# Patient Record
Sex: Female | Born: 1935
Health system: Southern US, Community
[De-identification: ages and names within clinical notes are randomized; demographics above are authoritative.]

## PROBLEM LIST (undated history)

## (undated) DIAGNOSIS — E785 Hyperlipidemia, unspecified: Secondary | ICD-10-CM

## (undated) DIAGNOSIS — R32 Unspecified urinary incontinence: Secondary | ICD-10-CM

## (undated) DIAGNOSIS — R519 Headache, unspecified: Secondary | ICD-10-CM

## (undated) DIAGNOSIS — F329 Major depressive disorder, single episode, unspecified: Secondary | ICD-10-CM

## (undated) DIAGNOSIS — R51 Headache: Secondary | ICD-10-CM

## (undated) DIAGNOSIS — G43909 Migraine, unspecified, not intractable, without status migrainosus: Secondary | ICD-10-CM

## (undated) DIAGNOSIS — R002 Palpitations: Secondary | ICD-10-CM

## (undated) DIAGNOSIS — M1990 Unspecified osteoarthritis, unspecified site: Secondary | ICD-10-CM

## (undated) DIAGNOSIS — I10 Essential (primary) hypertension: Secondary | ICD-10-CM

## (undated) DIAGNOSIS — E1140 Type 2 diabetes mellitus with diabetic neuropathy, unspecified: Secondary | ICD-10-CM

## (undated) DIAGNOSIS — F32A Depression, unspecified: Secondary | ICD-10-CM

## (undated) DIAGNOSIS — R42 Dizziness and giddiness: Secondary | ICD-10-CM

## (undated) DIAGNOSIS — Z5189 Encounter for other specified aftercare: Secondary | ICD-10-CM

## (undated) DIAGNOSIS — H409 Unspecified glaucoma: Secondary | ICD-10-CM

## (undated) HISTORY — DX: Palpitations: R00.2

## (undated) HISTORY — DX: Migraine, unspecified, not intractable, without status migrainosus: G43.909

## (undated) HISTORY — PX: OTHER SURGICAL HISTORY: SHX169

## (undated) HISTORY — DX: Unspecified osteoarthritis, unspecified site: M19.90

## (undated) HISTORY — DX: Depression, unspecified: F32.A

## (undated) HISTORY — DX: Essential (primary) hypertension: I10

## (undated) HISTORY — DX: Encounter for other specified aftercare: Z51.89

## (undated) HISTORY — DX: Unspecified urinary incontinence: R32

## (undated) HISTORY — DX: Headache, unspecified: R51.9

## (undated) HISTORY — DX: Unspecified glaucoma: H40.9

## (undated) HISTORY — DX: Hyperlipidemia, unspecified: E78.5

## (undated) HISTORY — DX: Headache: R51

## (undated) HISTORY — DX: Major depressive disorder, single episode, unspecified: F32.9

## (undated) HISTORY — DX: Dizziness and giddiness: R42

## (undated) HISTORY — DX: Type 2 diabetes mellitus with diabetic neuropathy, unspecified: E11.40

---

## 2016-12-30 DIAGNOSIS — Z6828 Body mass index (BMI) 28.0-28.9, adult: Secondary | ICD-10-CM | POA: Diagnosis not present

## 2016-12-30 DIAGNOSIS — E119 Type 2 diabetes mellitus without complications: Secondary | ICD-10-CM | POA: Diagnosis not present

## 2016-12-30 DIAGNOSIS — Z794 Long term (current) use of insulin: Secondary | ICD-10-CM | POA: Diagnosis not present

## 2016-12-30 DIAGNOSIS — E785 Hyperlipidemia, unspecified: Secondary | ICD-10-CM | POA: Insufficient documentation

## 2016-12-30 DIAGNOSIS — I10 Essential (primary) hypertension: Secondary | ICD-10-CM | POA: Insufficient documentation

## 2017-01-01 DIAGNOSIS — E119 Type 2 diabetes mellitus without complications: Secondary | ICD-10-CM | POA: Diagnosis not present

## 2017-01-27 ENCOUNTER — Other Ambulatory Visit (HOSPITAL_COMMUNITY)
Admission: RE | Admit: 2017-01-27 | Discharge: 2017-01-27 | Disposition: A | Discharge status: Home / Self Care | Payer: Medicare Other | Source: Ambulatory Visit | Attending: Obstetrics and Gynecology | Admitting: Obstetrics and Gynecology

## 2017-01-27 ENCOUNTER — Other Ambulatory Visit: Payer: Self-pay | Admitting: Obstetrics and Gynecology

## 2017-01-27 DIAGNOSIS — Z01419 Encounter for gynecological examination (general) (routine) without abnormal findings: Secondary | ICD-10-CM | POA: Insufficient documentation

## 2017-01-27 DIAGNOSIS — Z1151 Encounter for screening for human papillomavirus (HPV): Secondary | ICD-10-CM | POA: Insufficient documentation

## 2017-01-27 DIAGNOSIS — Z01411 Encounter for gynecological examination (general) (routine) with abnormal findings: Secondary | ICD-10-CM | POA: Diagnosis not present

## 2017-01-27 DIAGNOSIS — Z7984 Long term (current) use of oral hypoglycemic drugs: Secondary | ICD-10-CM | POA: Diagnosis not present

## 2017-01-27 DIAGNOSIS — I10 Essential (primary) hypertension: Secondary | ICD-10-CM | POA: Diagnosis not present

## 2017-01-29 LAB — CYTOLOGY - PAP
ADEQUACY: ABSENT
Diagnosis: NEGATIVE
HPV: NOT DETECTED

## 2017-02-05 ENCOUNTER — Telehealth: Payer: Self-pay | Admitting: *Deleted

## 2017-02-05 NOTE — Telephone Encounter (Signed)
NOTES SENT TO SCHEDULING.  °

## 2017-02-11 DIAGNOSIS — R5382 Chronic fatigue, unspecified: Secondary | ICD-10-CM | POA: Diagnosis not present

## 2017-02-11 DIAGNOSIS — E119 Type 2 diabetes mellitus without complications: Secondary | ICD-10-CM | POA: Diagnosis not present

## 2017-02-11 DIAGNOSIS — Z794 Long term (current) use of insulin: Secondary | ICD-10-CM | POA: Diagnosis not present

## 2017-02-11 DIAGNOSIS — E785 Hyperlipidemia, unspecified: Secondary | ICD-10-CM | POA: Diagnosis not present

## 2017-02-11 DIAGNOSIS — Z6829 Body mass index (BMI) 29.0-29.9, adult: Secondary | ICD-10-CM | POA: Diagnosis not present

## 2017-02-11 DIAGNOSIS — I10 Essential (primary) hypertension: Secondary | ICD-10-CM | POA: Diagnosis not present

## 2017-03-09 DIAGNOSIS — N814 Uterovaginal prolapse, unspecified: Secondary | ICD-10-CM | POA: Diagnosis not present

## 2017-03-12 ENCOUNTER — Encounter: Payer: Self-pay | Admitting: Interventional Cardiology

## 2017-03-16 DIAGNOSIS — M25551 Pain in right hip: Secondary | ICD-10-CM | POA: Diagnosis not present

## 2017-03-16 DIAGNOSIS — Z6829 Body mass index (BMI) 29.0-29.9, adult: Secondary | ICD-10-CM | POA: Diagnosis not present

## 2017-03-29 NOTE — Progress Notes (Signed)
Cardiology Office Note    Date:  03/30/2017   ID:  Karen Powell, DOB [birth year], MRN [id number]  PCP:  Deneen Harts, FNP  Cardiologist: Lesleigh Noe, MD   Chief Complaint  Patient presents with  . Advice Only    History of Present Illness:  Karen Powell is a 81 y.o. female referred by Deneen Harts NP for evaluation/consultation/And longitudinal care for hypertension and recurrent palpitations. Previously seen by [facility].  This patient brings records from her prior cardiologist in [facility]. There is no pre-existing cardiac diagnosis other than hypertension she has no particular cardiac complaints but has significant cardiovascular risk factors including poorly controlled type 2 diabetes mellitus with last hemoglobin A1c of 10.4, hypertension, not well controlled, and significant hyperlipidemia. She has never been a smoker. She has been troubled at times by palpitations and hearing her heart beat in her ear particularly at night when she is attempting to sleep. She has never had syncope. She ambulates without limitations. She denies chest pain and exertion-related dyspnea. There is no orthopnea.  Her mother informed her that she was born with a hole in her heart. No specific diagnosis is known by the patient  Past Medical History:  Diagnosis Date  . Arthritis   . Depression   . Diabetic neuropathy (HCC)   . Dizziness   . Hyperlipidemia   . Hypertension   . Palpitations     Past Surgical History:  Procedure Laterality Date  . none      Current Medications: Outpatient Medications Prior to Visit  Medication Sig Dispense Refill  . Cetirizine HCl (ZYRTEC ALLERGY PO) Take 1 tablet by mouth daily.    . Coenzyme Q10 (COQ10) 200 MG CAPS Take 1 capsule by mouth daily.    . Glucosamine HCl (GLUCOSAMINE PO) Take 3 tablets by mouth daily.    . insulin aspart (NOVOLOG) 100 UNIT/ML injection Inject 15 Units into the skin 3 (three) times daily with meals.      . insulin glargine (LANTUS) 100 UNIT/ML injection Inject 50 Units into the skin at bedtime.     Marland Kitchen losartan-hydrochlorothiazide (HYZAAR) 50-12.5 MG tablet Take 1 tablet by mouth daily.    . meloxicam (MOBIC) 15 MG tablet Take 15 mg by mouth daily.    . Naproxen Sodium (ALEVE PO) Take 1 tablet by mouth daily as needed (pain).     Maxwell Caul Bicarbonate (ZEGERID PO) Take 1 tablet by mouth daily.    Bertram Gala Glycol-Propyl Glycol (SYSTANE ULTRA OP) Apply 1 drop to eye daily.    . rosuvastatin (CRESTOR) 20 MG tablet Take 20 mg by mouth at bedtime.    . metFORMIN (GLUCOPHAGE) 500 MG tablet Take 500 mg by mouth 2 (two) times daily with a meal. Take 2 tablets by mouth in AM and 1 tablet by mouth in PM.    . metoprolol succinate (TOPROL-XL) 25 MG 24 hr tablet Take 25 mg by mouth 2 (two) times daily. Take 2 tablets by mouth in AM and 1 tablet by mouth in PM.     No facility-administered medications prior to visit.      Allergies:   Penicillins   Social History   Social History  . Marital status: Single    Spouse name: N/A  . Number of children: 4  . Years of education: 12   Occupational History  . retired    Social History Main Topics  . Smoking status: Never Smoker  . Smokeless tobacco: Never Used  .  Alcohol use Yes  . Drug use: No  . Sexual activity: Not Asked   Other Topics Concern  . None   Social History Narrative  . None     Family History:  The patient's family history includes Diabetes in her maternal grandfather and mother; Hypertension in her mother.   ROS:   Please see the history of present illness.    Back pain, muscle pain, depression, constipation, difficulty urinating, balance issues, headaches, leg pain, excessive fatigue, and cough.  All other systems reviewed and are negative.   PHYSICAL EXAM:   VS:  BP (!) 154/80 (BP Location: Left Arm)   Pulse 65   Ht 5' 3.5" (1.613 m)   Wt 158 lb (71.7 kg)   BMI 27.55 kg/m    GEN: Well nourished, well  developed, in no acute distress . African-American female in no acute distress. HEENT: normal . Poor dentition Neck: no JVD, carotid bruits, or masses Cardiac: RRR; no murmurs, rubs, or gallops,no edema  Respiratory:  clear to auscultation bilaterally, normal work of breathing GI: soft, nontender, nondistended, + BS MS: no deformity or atrophy  Skin: warm and dry, no rash Neuro:  Alert and Oriented x 3, Strength and sensation are intact Psych: euthymic mood, full affect  Wt Readings from Last 3 Encounters:  03/30/17 158 lb (71.7 kg)      Studies/Labs Reviewed:   EKG:  EKG  Normal sinus rhythm with nonspecific T-wave abnormality and rare PAC. No acute change and no evidence of prior infarction.  Recent Labs: No results found for requested labs within last 8760 hours.   Lipid Panel No results found for: CHOL, TRIG, HDL, CHOLHDL, VLDL, LDLCALC, LDLDIRECT  Additional studies/ records that were reviewed today include:  Most recent myocardial perfusion study may 2012 without evidence of ischemia. EF 63%  Most recent echocardiogram August 2012 with EF 72%, mild concentric hypertrophy, and aortic valve sclerosis. Holter monitor 2005 reveals normal sinus rhythm with multiple premature atrial complexes and rare ventricular immature contractions.    ASSESSMENT:    1. Essential hypertension   2. Palpitations   3. Mixed hyperlipidemia   4. Type 2 diabetes mellitus with complication, with long-term current use of insulin (HCC)      PLAN:  In order of problems listed above:  1. Poor blood pressure control. 2 g sodium diet as indicated. Consider increasing Hyzaar to 100/12.5 mg daily. Continue metoprolol succinate 25 mg per day. 2. Will not do further workup. These complaints are mostly related to hearing her heart beat in the right ear. 3. LDL cholesterol target less than equal to 70. Continue/intensify rosuvastatin dose. 4. As best I can tell from reviewing prior records, diabetes  control is poor with hemoglobin A1c greater than 10 [place] recently [place]   I have encouraged aerobic activity. Clinical follow-up in one year. No change in medical therapy. Risk factor modification to prevent progression to overt vascular disease: LDL less than 70, blood pressure less than 130/90 mmHg, aerobic activity, A1c 7 or less, and aspirin 81 mg daily rather than when necessary as she has done in the past.  With her current level of stability, we will plan to see her back in approximately one year.    Medication Adjustments/Labs and Tests Ordered: Current medicines are reviewed at length with the patient today.  Concerns regarding medicines are outlined above.  Medication changes, Labs and Tests ordered today are listed in the Patient Instructions below. Patient Instructions  Medication  Instructions:  1) START Aspirin 81mg  once daily  Labwork: None  Testing/Procedures: None  Follow-Up: Your physician wants you to follow-up in: 1 year with Dr. Katrinka Blazing.  You will receive a reminder letter in the mail two months in advance. If you don't receive a letter, please call our office to schedule the follow-up appointment.   Any Other Special Instructions Will Be Listed Below (If Applicable).     If you need a refill on your cardiac medications before your next appointment, please call your pharmacy.      Signed, Lesleigh Noe, MD  03/30/2017 2:53 PM    [facility] [address] [place], [place], [place]  [address] Phone: [phone]; Fax: [phone]

## 2017-03-30 ENCOUNTER — Encounter (INDEPENDENT_AMBULATORY_CARE_PROVIDER_SITE_OTHER): Payer: Self-pay

## 2017-03-30 ENCOUNTER — Encounter: Payer: Self-pay | Admitting: Interventional Cardiology

## 2017-03-30 ENCOUNTER — Ambulatory Visit (INDEPENDENT_AMBULATORY_CARE_PROVIDER_SITE_OTHER): Payer: Medicare Other | Admitting: Interventional Cardiology

## 2017-03-30 VITALS — BP 154/80 | HR 65 | Ht 63.5 in | Wt 158.0 lb

## 2017-03-30 DIAGNOSIS — R002 Palpitations: Secondary | ICD-10-CM

## 2017-03-30 DIAGNOSIS — E782 Mixed hyperlipidemia: Secondary | ICD-10-CM | POA: Diagnosis not present

## 2017-03-30 DIAGNOSIS — E118 Type 2 diabetes mellitus with unspecified complications: Secondary | ICD-10-CM

## 2017-03-30 DIAGNOSIS — Z794 Long term (current) use of insulin: Secondary | ICD-10-CM

## 2017-03-30 DIAGNOSIS — I10 Essential (primary) hypertension: Secondary | ICD-10-CM | POA: Diagnosis not present

## 2017-03-30 MED ORDER — ASPIRIN EC 81 MG PO TBEC
81.0000 mg | DELAYED_RELEASE_TABLET | Freq: Every day | ORAL | 3 refills | Status: DC
Start: 2017-03-30 — End: 2024-06-06

## 2017-03-30 NOTE — Patient Instructions (Signed)
Medication Instructions:  1) START Aspirin 81mg once daily  Labwork: None  Testing/Procedures: None  Follow-Up: Your physician wants you to follow-up in: 1 year with Dr. Smith.  You will receive a reminder letter in the mail two months in advance. If you don't receive a letter, please call our office to schedule the follow-up appointment.   Any Other Special Instructions Will Be Listed Below (If Applicable).     If you need a refill on your cardiac medications before your next appointment, please call your pharmacy.   

## 2017-04-27 DIAGNOSIS — Z780 Asymptomatic menopausal state: Secondary | ICD-10-CM | POA: Diagnosis not present

## 2017-07-16 DIAGNOSIS — E785 Hyperlipidemia, unspecified: Secondary | ICD-10-CM | POA: Diagnosis not present

## 2017-07-16 DIAGNOSIS — Z794 Long term (current) use of insulin: Secondary | ICD-10-CM | POA: Diagnosis not present

## 2017-07-16 DIAGNOSIS — Z23 Encounter for immunization: Secondary | ICD-10-CM | POA: Diagnosis not present

## 2017-07-16 DIAGNOSIS — I10 Essential (primary) hypertension: Secondary | ICD-10-CM | POA: Diagnosis not present

## 2017-07-16 DIAGNOSIS — E119 Type 2 diabetes mellitus without complications: Secondary | ICD-10-CM | POA: Diagnosis not present

## 2017-08-17 DIAGNOSIS — E785 Hyperlipidemia, unspecified: Secondary | ICD-10-CM | POA: Diagnosis not present

## 2017-08-17 DIAGNOSIS — I10 Essential (primary) hypertension: Secondary | ICD-10-CM | POA: Diagnosis not present

## 2017-08-17 DIAGNOSIS — Z794 Long term (current) use of insulin: Secondary | ICD-10-CM | POA: Diagnosis not present

## 2017-08-17 DIAGNOSIS — E1165 Type 2 diabetes mellitus with hyperglycemia: Secondary | ICD-10-CM | POA: Diagnosis not present

## 2017-09-29 DIAGNOSIS — Z794 Long term (current) use of insulin: Secondary | ICD-10-CM | POA: Diagnosis not present

## 2017-09-29 DIAGNOSIS — E1165 Type 2 diabetes mellitus with hyperglycemia: Secondary | ICD-10-CM | POA: Diagnosis not present

## 2017-10-20 DIAGNOSIS — Z7289 Other problems related to lifestyle: Secondary | ICD-10-CM | POA: Diagnosis not present

## 2017-10-20 DIAGNOSIS — E1165 Type 2 diabetes mellitus with hyperglycemia: Secondary | ICD-10-CM | POA: Diagnosis not present

## 2017-10-20 DIAGNOSIS — R05 Cough: Secondary | ICD-10-CM | POA: Diagnosis not present

## 2017-10-20 DIAGNOSIS — J069 Acute upper respiratory infection, unspecified: Secondary | ICD-10-CM | POA: Diagnosis not present

## 2017-10-20 DIAGNOSIS — I10 Essential (primary) hypertension: Secondary | ICD-10-CM | POA: Diagnosis not present

## 2017-10-20 DIAGNOSIS — Z794 Long term (current) use of insulin: Secondary | ICD-10-CM | POA: Diagnosis not present

## 2018-01-13 ENCOUNTER — Telehealth: Payer: Self-pay | Admitting: Interventional Cardiology

## 2018-01-13 NOTE — Telephone Encounter (Signed)
New message   1. What dental office are you calling from? Dr Domingo Dimesdeh  2. What is your office phone and fax number? PHONE [phone], FAX [phone]  3. What type of procedure is the patient having performed? Extraction and dental filling  4. What date is procedure scheduled or is the patient there now? 01/13/2018 (patient in office now)  5. What is your question (ex. Antibiotics prior to procedure, holding medication-we need to know how long dentist wants pt to hold med)? Is pre medication needed

## 2018-01-13 NOTE — Telephone Encounter (Signed)
Per Corine ShelterLuke Kilroy, [place], pt has not been seen in over a year but does not have any active cardiac issues. She does not need to be pre-medicated prior to dental work.

## 2018-02-23 DIAGNOSIS — Z23 Encounter for immunization: Secondary | ICD-10-CM | POA: Diagnosis not present

## 2018-02-23 DIAGNOSIS — L299 Pruritus, unspecified: Secondary | ICD-10-CM | POA: Diagnosis not present

## 2018-02-23 DIAGNOSIS — E1169 Type 2 diabetes mellitus with other specified complication: Secondary | ICD-10-CM | POA: Diagnosis not present

## 2018-02-23 DIAGNOSIS — I10 Essential (primary) hypertension: Secondary | ICD-10-CM | POA: Diagnosis not present

## 2018-02-23 DIAGNOSIS — E785 Hyperlipidemia, unspecified: Secondary | ICD-10-CM | POA: Diagnosis not present

## 2018-04-14 DIAGNOSIS — Z1211 Encounter for screening for malignant neoplasm of colon: Secondary | ICD-10-CM | POA: Diagnosis not present

## 2018-04-14 DIAGNOSIS — K921 Melena: Secondary | ICD-10-CM | POA: Diagnosis not present

## 2018-04-19 DIAGNOSIS — K921 Melena: Secondary | ICD-10-CM | POA: Diagnosis not present

## 2018-04-19 DIAGNOSIS — Z1211 Encounter for screening for malignant neoplasm of colon: Secondary | ICD-10-CM | POA: Diagnosis not present

## 2018-04-20 DIAGNOSIS — Z794 Long term (current) use of insulin: Secondary | ICD-10-CM | POA: Diagnosis not present

## 2018-04-20 DIAGNOSIS — Z7289 Other problems related to lifestyle: Secondary | ICD-10-CM | POA: Diagnosis not present

## 2018-04-20 DIAGNOSIS — I10 Essential (primary) hypertension: Secondary | ICD-10-CM | POA: Diagnosis not present

## 2018-04-20 DIAGNOSIS — E1165 Type 2 diabetes mellitus with hyperglycemia: Secondary | ICD-10-CM | POA: Diagnosis not present

## 2018-04-20 DIAGNOSIS — K08409 Partial loss of teeth, unspecified cause, unspecified class: Secondary | ICD-10-CM | POA: Diagnosis not present

## 2018-05-11 DIAGNOSIS — Z23 Encounter for immunization: Secondary | ICD-10-CM | POA: Diagnosis not present

## 2018-05-13 DIAGNOSIS — K648 Other hemorrhoids: Secondary | ICD-10-CM | POA: Diagnosis not present

## 2018-05-13 DIAGNOSIS — Z1211 Encounter for screening for malignant neoplasm of colon: Secondary | ICD-10-CM | POA: Diagnosis not present

## 2018-05-13 DIAGNOSIS — K5730 Diverticulosis of large intestine without perforation or abscess without bleeding: Secondary | ICD-10-CM | POA: Diagnosis not present

## 2018-05-13 DIAGNOSIS — R195 Other fecal abnormalities: Secondary | ICD-10-CM | POA: Diagnosis not present

## 2018-06-17 DIAGNOSIS — M792 Neuralgia and neuritis, unspecified: Secondary | ICD-10-CM | POA: Diagnosis not present

## 2018-06-17 DIAGNOSIS — M5441 Lumbago with sciatica, right side: Secondary | ICD-10-CM | POA: Diagnosis not present

## 2018-06-17 DIAGNOSIS — M5442 Lumbago with sciatica, left side: Secondary | ICD-10-CM | POA: Diagnosis not present

## 2018-06-24 DIAGNOSIS — M545 Low back pain: Secondary | ICD-10-CM | POA: Diagnosis not present

## 2018-06-24 DIAGNOSIS — M792 Neuralgia and neuritis, unspecified: Secondary | ICD-10-CM | POA: Diagnosis not present

## 2018-06-24 DIAGNOSIS — M5441 Lumbago with sciatica, right side: Secondary | ICD-10-CM | POA: Diagnosis not present

## 2018-06-24 DIAGNOSIS — M5442 Lumbago with sciatica, left side: Secondary | ICD-10-CM | POA: Diagnosis not present

## 2018-06-24 DIAGNOSIS — M5136 Other intervertebral disc degeneration, lumbar region: Secondary | ICD-10-CM | POA: Diagnosis not present

## 2018-07-20 NOTE — Progress Notes (Signed)
Cardiology Office Note:    Date:  07/21/2018   ID:  [name], DOB October 16, 1935, MRN [id number]  PCP:  Deneen Harts, FNP  Cardiologist:  Lesleigh Noe, MD   Referring MD: Deneen Harts, FNP   Chief Complaint  Patient presents with  . Hypertension  . Irregular Heart Beat    History of Present Illness:    Karen Powell is a 82 y.o. female with a hx of hypertension and recurrent palpitations..  Also type 2 diabetes and chronic back pain.  Continues to have palpitations but no prolonged heart racing.  Denies orthopnea, PND, and syncope.  No exertional dyspnea.  No episodes of chest pain.  Lower extremity swelling towards the end of each day but without persistence.  Past Medical History:  Diagnosis Date  . Arthritis   . Depression   . Diabetic neuropathy (HCC)   . Dizziness   . Hyperlipidemia   . Hypertension   . Palpitations     Past Surgical History:  Procedure Laterality Date  . none      Current Medications: Current Meds  Medication Sig  . aspirin EC 81 MG tablet Take 1 tablet (81 mg total) by mouth daily.  . AZO-CRANBERRY PO Take 2 capsules by mouth every morning.  . Cetirizine HCl (ZYRTEC ALLERGY PO) Take 1 tablet by mouth daily.  . Coenzyme Q10 (COQ10) 200 MG CAPS Take 1 capsule by mouth daily.  Marland Kitchen esomeprazole (NEXIUM) 20 MG capsule Take 20 mg by mouth daily at 12 noon.  . insulin aspart (NOVOLOG) 100 UNIT/ML injection Inject 15 Units into the skin 3 (three) times daily with meals.  . insulin glargine (LANTUS) 100 UNIT/ML injection Inject 55 Units into the skin at bedtime.   Marland Kitchen losartan-hydrochlorothiazide (HYZAAR) 100-25 MG tablet Take 1 tablet by mouth daily.  . metFORMIN (GLUCOPHAGE) 500 MG tablet Take 500 mg by mouth 2 (two) times daily.   . Multiple Vitamins-Minerals (CENTRUM PO) Take 1 tablet by mouth daily.  . Naproxen Sodium (ALEVE PO) Take 2 tablets by mouth daily as needed (pain).   . Omega-3 Fatty Acids (OMEGA 3 PO) Take 1 capsule by  mouth daily.  Bertram Gala Glycol-Propyl Glycol (SYSTANE ULTRA OP) Apply 1 drop to eye daily.  . Probiotic Product (PROBIOTIC PO) Take 1 capsule by mouth at bedtime.  . rosuvastatin (CRESTOR) 20 MG tablet Take 20 mg by mouth at bedtime.  Bernadette Hoit Sodium (SENOKOT S PO) Take 1 tablet by mouth 2 (two) times daily.  . TOPROL XL 25 MG 24 hr tablet Take 2 tablets (50 mg) by mouth each morning and 1 tablet (25 mg) by mouth each evening.     Allergies:   Penicillins   Social History   Socioeconomic History  . Marital status: Single    Spouse name: Not on file  . Number of children: 4  . Years of education: 32  . Highest education level: Not on file  Occupational History  . Occupation: retired  Engineer, production  . Financial resource strain: Not on file  . Food insecurity:    Worry: Not on file    Inability: Not on file  . Transportation needs:    Medical: Not on file    Non-medical: Not on file  Tobacco Use  . Smoking status: Never Smoker  . Smokeless tobacco: Never Used  Substance and Sexual Activity  . Alcohol use: Yes  . Drug use: No  . Sexual activity: Not on file  Lifestyle  . Physical  activity:    Days per week: Not on file    Minutes per session: Not on file  . Stress: Not on file  Relationships  . Social connections:    Talks on phone: Not on file    Gets together: Not on file    Attends religious service: Not on file    Active member of club or organization: Not on file    Attends meetings of clubs or organizations: Not on file    Relationship status: Not on file  Other Topics Concern  . Not on file  Social History Narrative  . Not on file     Family History: The patient's family history includes Diabetes in her maternal grandfather and mother; Hypertension in her mother.  ROS:   Please see the history of present illness.    Significant back pain decreased hearing, cough, abdominal pain, depression, and leg swelling.  All other systems reviewed and  are negative.  EKGs/Labs/Other Studies Reviewed:    The following studies were reviewed today:  Hemoglobin A1c 7.6  LDL 67  EKG:  EKG is  ordered today.  The ekg ordered today demonstrates normal sinus rhythm, prominent voltage, left axis deviation, nonspecific ST abnormality.  When compared to prior performed on 03/30/2017, no significant change has occurred.  Recent Labs: No results found for requested labs within last 8760 hours.  Recent Lipid Panel No results found for: CHOL, TRIG, HDL, CHOLHDL, VLDL, LDLCALC, LDLDIRECT  Physical Exam:    VS:  BP (!) 180/96   Pulse 70   Ht 5\' 3"  (1.6 m)   Wt 172 lb 1.9 oz (78.1 kg)   BMI 30.49 kg/m     Wt Readings from Last 3 Encounters:  07/21/18 172 lb 1.9 oz (78.1 kg)  03/30/17 158 lb (71.7 kg)     GEN:  Well nourished, well developed in no acute distress HEENT: Normal NECK: No JVD. LYMPHATICS: No lymphadenopathy CARDIAC: RRR, no murmur, no gallop, no edema. VASCULAR: 2+ bilateral radial and carotid pulses.  No bruits. RESPIRATORY:  Clear to auscultation without rales, wheezing or rhonchi  ABDOMEN: Soft, non-tender, non-distended, No pulsatile mass, MUSCULOSKELETAL: No deformity  SKIN: Warm and dry NEUROLOGIC:  Alert and oriented x 3 PSYCHIATRIC:  Normal affect   ASSESSMENT:    1. Palpitations   2. Essential hypertension   3. Type 2 diabetes mellitus with complication, with long-term current use of insulin (HCC)   4. Mixed hyperlipidemia    PLAN:    In order of problems listed above:  1. No change and no concerns 2. Severe elevation today but has not yet had medications.  We discussed the target of 130/80 mmHg. 3. A1c less than 7 is target 4. LDL target less than 70.  Encourage aerobic activity.  No testing required at this time.  Clinical follow-up in 1 year.  Notify us if symptoms.   Medication Adjustments/Labs and Tests Ordered: Current medicines are reviewed at length with the patient today.  Concerns regarding  medicines are outlined above.  Orders Placed This Encounter  Procedures  . EKG 12-Lead   No orders of the defined types were placed in this encounter.   Patient Instructions  Medication Instructions:  Your physician recommends that you continue on your current medications as directed. Please refer to the Current Medication list given to you today.  If you need a refill on your cardiac medications before your next appointment, please call your pharmacy.   Lab work: none If you have  labs (blood work) drawn today and your tests are completely normal, you will receive your results only by: Marland Kitchen MyChart Message (if you have MyChart) OR . A paper copy in the mail If you have any lab test that is abnormal or we need to change your treatment, we will call you to review the results.  Testing/Procedures: none  Follow-Up: At [facility], you and your health needs are our priority.  As part of our continuing mission to provide you with exceptional heart care, we have created designated Provider Care Teams.  These Care Teams include your primary Cardiologist (physician) and Advanced Practice Providers (APPs -  Physician Assistants and Nurse Practitioners) who all work together to provide you with the care you need, when you need it. You will need a follow up appointment in 12 months.  Please call our office 2 months in advance to schedule this appointment.  You may see Lesleigh Noe, MD or one of the following Advanced Practice Providers on your designated Care Team:   Norma Fredrickson, NP Nada Boozer, NP . Georgie Chard, NP  Any Other Special Instructions Will Be Listed Below (If Applicable).       Signed, Lesleigh Noe, MD  07/21/2018 9:56 AM    [facility]

## 2018-07-21 ENCOUNTER — Ambulatory Visit (INDEPENDENT_AMBULATORY_CARE_PROVIDER_SITE_OTHER): Payer: Medicare Other | Admitting: Interventional Cardiology

## 2018-07-21 ENCOUNTER — Encounter

## 2018-07-21 ENCOUNTER — Encounter: Payer: Self-pay | Admitting: Interventional Cardiology

## 2018-07-21 VITALS — BP 180/96 | HR 70 | Ht 63.0 in | Wt 172.1 lb

## 2018-07-21 DIAGNOSIS — Z794 Long term (current) use of insulin: Secondary | ICD-10-CM | POA: Diagnosis not present

## 2018-07-21 DIAGNOSIS — R002 Palpitations: Secondary | ICD-10-CM

## 2018-07-21 DIAGNOSIS — I10 Essential (primary) hypertension: Secondary | ICD-10-CM

## 2018-07-21 DIAGNOSIS — E118 Type 2 diabetes mellitus with unspecified complications: Secondary | ICD-10-CM | POA: Diagnosis not present

## 2018-07-21 DIAGNOSIS — E782 Mixed hyperlipidemia: Secondary | ICD-10-CM | POA: Diagnosis not present

## 2018-07-21 NOTE — Patient Instructions (Signed)

## 2018-08-13 ENCOUNTER — Other Ambulatory Visit: Payer: Self-pay | Admitting: *Deleted

## 2018-08-13 DIAGNOSIS — N819 Female genital prolapse, unspecified: Secondary | ICD-10-CM | POA: Diagnosis not present

## 2018-08-13 DIAGNOSIS — Z4689 Encounter for fitting and adjustment of other specified devices: Secondary | ICD-10-CM | POA: Diagnosis not present

## 2018-08-13 MED ORDER — TOPROL XL 25 MG PO TB24: ORAL_TABLET | ORAL | 3 refills | Status: DC

## 2018-08-13 NOTE — Telephone Encounter (Signed)
Patient requesting that Dr Katrinka BlazingSmith refill this for her as her pcp will no longer refill it. Okay to refill? Please advise. Thanks, MI

## 2018-08-13 NOTE — Telephone Encounter (Signed)
That will be fine. Thanks 

## 2018-09-28 DIAGNOSIS — Z4689 Encounter for fitting and adjustment of other specified devices: Secondary | ICD-10-CM | POA: Diagnosis not present

## 2018-11-15 ENCOUNTER — Telehealth: Payer: Self-pay

## 2018-11-15 NOTE — Telephone Encounter (Signed)
**Note De-Identified  Obfuscation** Letter received from [place] stating that they have approved a tier exception on the pts Toprol XL. Approval good from 08/14/2018 until 11/12/2019.  I have notified the pts pharmacy of this approval.

## 2018-11-25 ENCOUNTER — Ambulatory Visit (INDEPENDENT_AMBULATORY_CARE_PROVIDER_SITE_OTHER): Payer: Medicare Other | Admitting: Family Medicine

## 2018-11-25 ENCOUNTER — Encounter: Payer: Self-pay | Admitting: Family Medicine

## 2018-11-25 VITALS — BP 152/82 | HR 80 | Temp 98.7°F | Ht 63.0 in | Wt 170.0 lb

## 2018-11-25 DIAGNOSIS — Z794 Long term (current) use of insulin: Secondary | ICD-10-CM

## 2018-11-25 DIAGNOSIS — Z7689 Persons encountering health services in other specified circumstances: Secondary | ICD-10-CM | POA: Diagnosis not present

## 2018-11-25 DIAGNOSIS — M65352 Trigger finger, left little finger: Secondary | ICD-10-CM | POA: Diagnosis not present

## 2018-11-25 DIAGNOSIS — I10 Essential (primary) hypertension: Secondary | ICD-10-CM | POA: Diagnosis not present

## 2018-11-25 DIAGNOSIS — E1140 Type 2 diabetes mellitus with diabetic neuropathy, unspecified: Secondary | ICD-10-CM

## 2018-11-25 DIAGNOSIS — K219 Gastro-esophageal reflux disease without esophagitis: Secondary | ICD-10-CM | POA: Diagnosis not present

## 2018-11-25 DIAGNOSIS — E782 Mixed hyperlipidemia: Secondary | ICD-10-CM | POA: Diagnosis not present

## 2018-11-25 LAB — POCT GLYCOSYLATED HEMOGLOBIN (HGB A1C): Hemoglobin A1C: 7.7 % — AB (ref 4.0–5.6)

## 2018-11-25 NOTE — Patient Instructions (Signed)
Trigger Finger  Trigger finger (stenosing tenosynovitis) is a condition that causes a finger to get stuck in a bent position. Each finger has a tough, cord-like tissue that connects muscle to bone (tendon), and each tendon is surrounded by a tunnel of tissue (tendon sheath). To move your finger, your tendon needs to slide freely through the sheath. Trigger finger happens when the tendon or the sheath thickens, making it difficult to move your finger. Trigger finger can affect any finger or a thumb. It may affect more than one finger. Mild cases may clear up with rest and medicine. Severe cases require more treatment. What are the causes? Trigger finger is caused by a thickened finger tendon or tendon sheath. The cause of this thickening is not known. What increases the risk? The following factors may make you more likely to develop this condition:  Doing activities that require a strong grip.  Having rheumatoid arthritis, gout, or diabetes.  Being 59-71 years old.  Being a woman. What are the signs or symptoms? Symptoms of this condition include:  Pain when bending or straightening your finger.  Tenderness or swelling where your finger attaches to the palm of your hand.  A lump in the palm of your hand or on the inside of your finger.  Hearing a popping sound when you try to straighten your finger.  Feeling a popping, catching, or locking sensation when you try to straighten your finger.  Being unable to straighten your finger. How is this diagnosed? This condition is diagnosed based on your symptoms and a physical exam. How is this treated? This condition may be treated by:  Resting your finger and avoiding activities that make symptoms worse.  Wearing a finger splint to keep your finger in a slightly bent position.  Taking NSAIDs to relieve pain and swelling.  Injecting medicine (steroids) into the tendon sheath to reduce swelling and irritation. Injections may need to be  repeated.  Having surgery to open the tendon sheath. This may be done if other treatments do not work and you cannot straighten your finger. You may need physical therapy after surgery. Follow these instructions at home:   Use moist heat to help reduce pain and swelling as told by your health care provider.  Rest your finger and avoid activities that make pain worse. Return to normal activities as told by your health care provider.  If you have a splint, wear it as told by your health care provider.  Take over-the-counter and prescription medicines only as told by your health care provider.  Keep all follow-up visits as told by your health care provider. This is important. Contact a health care provider if:  Your symptoms are not improving with home care. Summary  Trigger finger (stenosing tenosynovitis) causes your finger to get stuck in a bent position, and it can make it difficult and painful to straighten your finger.  This condition develops when a finger tendon or tendon sheath thickens.  Treatment starts with resting, wearing a splint, and taking NSAIDs.  In severe cases, surgery to open the tendon sheath may be needed. This information is not intended to replace advice given to you by your health care provider. Make sure you discuss any questions you have with your health care provider. Document Released: 06/28/2004 Document Revised: 08/19/2016 Document Reviewed: 08/19/2016 Elsevier Interactive Patient Education  2019 [employer] Inc[employer]  Diabetes Mellitus and Nutrition, Adult When you have diabetes (diabetes mellitus), it is very important to have healthy eating habits because your  blood sugar (glucose) levels are greatly affected by what you eat and drink. Eating healthy foods in the appropriate amounts, at about the same times every day, can help you:  Control your blood glucose.  Lower your risk of heart disease.  Improve your blood pressure.  Reach or maintain a  healthy weight. Every person with diabetes is different, and each person has different needs for a meal plan. Your health care provider may recommend that you work with a diet and nutrition specialist (dietitian) to make a meal plan that is best for you. Your meal plan may vary depending on factors such as:  The calories you need.  The medicines you take.  Your weight.  Your blood glucose, blood pressure, and cholesterol levels.  Your activity level.  Other health conditions you have, such as heart or kidney disease. How do carbohydrates affect me? Carbohydrates, also called carbs, affect your blood glucose level more than any other type of food. Eating carbs naturally raises the amount of glucose in your blood. Carb counting is a method for keeping track of how many carbs you eat. Counting carbs is important to keep your blood glucose at a healthy level, especially if you use insulin or take certain oral diabetes medicines. It is important to know how many carbs you can safely have in each meal. This is different for every person. Your dietitian can help you calculate how many carbs you should have at each meal and for each snack. Foods that contain carbs include:  Bread, cereal, rice, pasta, and crackers.  Potatoes and corn.  Peas, beans, and lentils.  Milk and yogurt.  Fruit and juice.  Desserts, such as cakes, cookies, ice cream, and candy. How does alcohol affect me? Alcohol can cause a sudden decrease in blood glucose (hypoglycemia), especially if you use insulin or take certain oral diabetes medicines. Hypoglycemia can be a life-threatening condition. Symptoms of hypoglycemia (sleepiness, dizziness, and confusion) are similar to symptoms of having too much alcohol. If your health care provider says that alcohol is safe for you, follow these guidelines:  Limit alcohol intake to no more than 1 drink per day for nonpregnant women and 2 drinks per day for men. One drink equals 12  oz of beer, 5 oz of wine, or 1 oz of hard liquor.  Do not drink on an empty stomach.  Keep yourself hydrated with water, diet soda, or unsweetened iced tea.  Keep in mind that regular soda, juice, and other mixers may contain a lot of sugar and must be counted as carbs. What are tips for following this plan?  Reading food labels  Start by checking the serving size on the "Nutrition Facts" label of packaged foods and drinks. The amount of calories, carbs, fats, and other nutrients listed on the label is based on one serving of the item. Many items contain more than one serving per package.  Check the total grams (g) of carbs in one serving. You can calculate the number of servings of carbs in one serving by dividing the total carbs by 15. For example, if a food has 30 g of total carbs, it would be equal to 2 servings of carbs.  Check the number of grams (g) of saturated and trans fats in one serving. Choose foods that have low or no amount of these fats.  Check the number of milligrams (mg) of salt (sodium) in one serving. Most people should limit total sodium intake to less than 2,300 mg per  day.  Always check the nutrition information of foods labeled as "low-fat" or "nonfat". These foods may be higher in added sugar or refined carbs and should be avoided.  Talk to your dietitian to identify your daily goals for nutrients listed on the label. Shopping  Avoid buying canned, premade, or processed foods. These foods tend to be high in fat, sodium, and added sugar.  Shop around the outside edge of the grocery store. This includes fresh fruits and vegetables, bulk grains, fresh meats, and fresh dairy. Cooking  Use low-heat cooking methods, such as baking, instead of high-heat cooking methods like deep frying.  Cook using healthy oils, such as olive, canola, or sunflower oil.  Avoid cooking with butter, cream, or high-fat meats. Meal planning  Eat meals and snacks regularly,  preferably at the same times every day. Avoid going long periods of time without eating.  Eat foods high in fiber, such as fresh fruits, vegetables, beans, and whole grains. Talk to your dietitian about how many servings of carbs you can eat at each meal.  Eat 4-6 ounces (oz) of lean protein each day, such as lean meat, chicken, fish, eggs, or tofu. One oz of lean protein is equal to: ? 1 oz of meat, chicken, or fish. ? 1 egg. ?  cup of tofu.  Eat some foods each day that contain healthy fats, such as avocado, nuts, seeds, and fish. Lifestyle  Check your blood glucose regularly.  Exercise regularly as told by your health care provider. This may include: ? 150 minutes of moderate-intensity or vigorous-intensity exercise each week. This could be brisk walking, biking, or water aerobics. ? Stretching and doing strength exercises, such as yoga or weightlifting, at least 2 times a week.  Take medicines as told by your health care provider.  Do not use any products that contain nicotine or tobacco, such as cigarettes and e-cigarettes. If you need help quitting, ask your health care provider.  Work with a Veterinary surgeon or diabetes educator to identify strategies to manage stress and any emotional and social challenges. Questions to ask a health care provider  Do I need to meet with a diabetes educator?  Do I need to meet with a dietitian?  What number can I call if I have questions?  When are the best times to check my blood glucose? Where to find more information:  American Diabetes Association: diabetes.org  Academy of Nutrition and Dietetics: www.eatright.[employer] of Diabetes and Digestive and Kidney Diseases (NIH): CarFli[employer].tn Summary  A healthy meal plan will help you control your blood glucose and maintain a healthy lifestyle.  Working with a diet and nutrition specialist (dietitian) can help you make a meal plan that is best for you.  Keep in mind that  carbohydrates (carbs) and alcohol have immediate effects on your blood glucose levels. It is important to count carbs and to use alcohol carefully. This information is not intended to replace advice given to you by your health care provider. Make sure you discuss any questions you have with your health care provider. Document Released: 06/05/2005 Document Revised: 04/08/2017 Document Reviewed: 10/13/2016 Elsevier Interactive Patient Education  2019 Elsevier Inc.  Diabetic Neuropathy Diabetic neuropathy refers to nerve damage that is caused by diabetes (diabetes mellitus). Over time, people with diabetes can develop nerve damage throughout the body. There are several types of diabetic neuropathy:  Peripheral neuropathy. This is the most common type of diabetic neuropathy. It causes damage to nerves that carry signals between  the spinal cord and other parts of the body (peripheral nerves). This usually affects nerves in the feet and legs first, and may eventually affect the hands and arms. The damage affects the ability to sense touch or temperature.  Autonomic neuropathy. This type causes damage to nerves that control involuntary functions (autonomic nerves). These nerves carry signals that control: ? Heartbeat. ? Body temperature. ? Blood pressure. ? Urination. ? Digestion. ? Sweating. ? Sexual function. ? Response to changing blood sugar (glucose) levels.  Focal neuropathy. This type of nerve damage affects one area of the body, such as an arm, a leg, or the face. The injury may involve one nerve or a small group of nerves. Focal neuropathy can be painful and unpredictable, and occurs most often in older adults with diabetes. This often develops suddenly, but usually improves over time and does not cause long-term problems.  Proximal neuropathy. This type of nerve damage affects the nerves of the thighs, hips, buttocks, or legs. It causes severe pain, weakness, and muscle death (atrophy),  usually in the thigh muscles. It is more common among older men and people who have type 2 diabetes. The length of recovery time may vary. What are the causes? Peripheral, autonomic, and focal neuropathies are caused by diabetes that is not well controlled with treatment. The cause of proximal neuropathy is not known, but it may be caused by inflammation related to uncontrolled blood glucose levels. What are the signs or symptoms? Peripheral neuropathy Peripheral neuropathy develops slowly over time. When the nerves of the feet and legs no longer work, you may experience:  Burning, stabbing, or aching pain in the legs or feet.  Pain or cramping in the legs or feet.  Loss of feeling (numbness) and inability to feel pressure or pain in the feet. This can lead to: ? Thick calluses or sores on areas of constant pressure. ? Ulcers. ? Reduced ability to feel temperature changes.  Foot deformities.  Muscle weakness.  Loss of balance or coordination. Autonomic neuropathy The symptoms of autonomic neuropathy vary depending on which nerves are affected. Symptoms may include:  Problems with digestion, such as: ? Nausea or vomiting. ? Poor appetite. ? Bloating. ? Diarrhea or constipation. ? Trouble swallowing. ? Losing weight without trying to.  Problems with the heart, blood and lungs, such as: ? Dizziness, especially when standing up. ? Fainting. ? Shortness of breath. ? Irregular heartbeat.  Bladder problems, such as: ? Trouble starting or stopping urination. ? Leaking urine. ? Trouble emptying the bladder. ? Urinary tract infections (UTIs).  Problems with other body functions, such as: ? Sweat. You may sweat too much or too little. ? Temperature. You might get hot easily. Or, you might feel cold more than usual. ? Sexual function. Men may not be able to get or maintain an erection. Women may have vaginal dryness and difficulty with arousal. Focal neuropathy Symptoms affect  only one area of the body. Common symptoms include:  Numbness.  Tingling.  Burning pain.  Prickling feeling.  Very sensitive skin.  Weakness.  Inability to move (paralysis).  Muscle twitching.  Muscles getting smaller (wasting).  Poor coordination.  Double or blurred vision. Proximal neuropathy  Sudden, severe pain in the hip, thigh, or buttocks. Pain may spread from the back into the legs (sciatica).  Pain and numbness in the arms and legs.  Tingling.  Loss of bladder control or bowel control.  Weakness and wasting of thigh muscles.  Difficulty getting up from a seated  position.  Abdominal swelling.  Unexplained weight loss. How is this diagnosed? Diagnosis usually involves reviewing your medical history and any symptoms you have. Diagnosis varies depending on the type of neuropathy your health care provider suspects. Peripheral neuropathy Your health care provider will check areas that are affected by your nervous system (neurologic exam), such as your reflexes, how you move, and what you can feel. You may have other tests, such as:  Blood tests.  Removal and examination of fluid that surrounds the spinal cord (lumbar puncture).  CT scan.  MRI.  A test to check the nerves that control muscles (electromyogram, EMG).  Tests of how quickly messages pass through your nerves (nerve conduction velocity tests).  Removal of a small piece of nerve to be examined under a microscope (biopsy). Autonomic neuropathy You may have tests, such as:  Tests to measure your blood pressure and heart rate. This may include monitoring you while you are safely secured to an exam table that moves you from a lying position to an upright position (table tilt test).  Breathing tests to check your lungs.  Tests to check how food moves through the digestive system (gastric emptying tests).  Blood, sweat, or urine tests.  Ultrasound of your bladder.  Spinal fluid  tests. Focal neuropathy This condition may be diagnosed with:  A neurologic exam.  CT scan.  MRI.  EMG.  Nerve conduction velocity tests. Proximal neuropathy There is no test to diagnose this type of neuropathy. You may have tests to rule out other possible causes of this type of neuropathy. Tests may include:  X-rays of your spine and lumbar region.  Lumbar puncture.  MRI. How is this treated? The goal of treatment is to keep nerve damage from getting worse. The most important part of treatment is keeping your blood glucose level and your A1C level within your target range by following your diabetes management plan. Over time, maintaining lower blood glucose levels helps lessen symptoms. In some cases, you may need prescription pain medicine. Follow these instructions at home:  Lifestyle   Do not use any products that contain nicotine or tobacco, such as cigarettes and e-cigarettes. If you need help quitting, ask your health care provider.  Be physically active every day. Include strength training and balance exercises.  Follow a healthy meal plan.  Work with your health care provider to manage your blood pressure. General instructions  Follow your diabetes management plan as directed. ? Check your blood glucose levels as directed by your health care provider. ? Keep your blood glucose in your target range as directed by your health care provider. ? Have your A1C level checked at least two times a year, or as often as told by your health care provider.  Take over the counter and prescription medicines only as told by your health care provider. This includes insulin and diabetes medicine.  Do not drive or use heavy machinery while taking prescription pain medicines.  Check your skin and feet every day for cuts, bruises, redness, blisters, or sores.  Keep all follow up visits as told by your health care provider. This is important. Contact a health care provider  if:  You have burning, stabbing, or aching pain in your legs or feet.  You are unable to feel pressure or pain in your feet.  You develop problems with digestion, such as: ? Nausea. ? Vomiting. ? Bloating. ? Constipation. ? Diarrhea. ? Abdominal pain.  You have difficulty with urination, such as inability: ?  To control when you urinate (incontinence). ? To completely empty the bladder (retention).  You have palpitations.  You feel dizzy, weak, or faint when you stand up. Get help right away if:  You cannot urinate.  You have sudden weakness or loss of coordination.  You have trouble speaking.  You have pain or pressure in your chest.  You have an irregular heart beat.  You have sudden inability to move a part of your body. Summary  Diabetic neuropathy refers to nerve damage that is caused by diabetes. It can affect nerves throughout the entire body, causing numbness and pain in the arms, legs, digestive tract, heart, and other body systems.  Keep your blood glucose level and your blood pressure in your target range, as directed by your health care provider. This can help prevent neuropathy from getting worse.  Check your skin and feet every day for cuts, bruises, redness, blisters, or sores.  Do not use any products that contain nicotine or tobacco, such as cigarettes and e-cigarettes. If you need help quitting, ask your health care provider. This information is not intended to replace advice given to you by your health care provider. Make sure you discuss any questions you have with your health care provider. Document Released: 11/17/2001 Document Revised: 10/21/2017 Document Reviewed: 10/13/2016 Elsevier Interactive Patient Education  2019 [employer]  How to Take Your Blood Pressure You can take your blood pressure at home with a machine. You may need to check your blood pressure at home:  To check if you have high blood pressure (hypertension).  To check  your blood pressure over time.  To make sure your blood pressure medicine is working. Supplies needed: You will need a blood pressure machine, or monitor. You can buy one at a drugstore or online. When choosing one:  Choose one with an arm cuff.  Choose one that wraps around your upper arm. Only one finger should fit between your arm and the cuff.  Do not choose one that measures your blood pressure from your wrist or finger. Your doctor can suggest a monitor. How to prepare Avoid these things for 30 minutes before checking your blood pressure:  Drinking caffeine.  Drinking alcohol.  Eating.  Smoking.  Exercising. Five minutes before checking your blood pressure:  Pee.  Sit in a dining chair. Avoid sitting in a soft couch or armchair.  Be quiet. Do not talk. How to take your blood pressure Follow the instructions that came with your machine. If you have a digital blood pressure monitor, these may be the instructions: 1. Sit up straight. 2. Place your feet on the floor. Do not cross your ankles or legs. 3. Rest your left arm at the level of your heart. You may rest it on a table, desk, or chair. 4. Pull up your shirt sleeve. 5. Wrap the blood pressure cuff around the upper part of your left arm. The cuff should be 1 inch (2.5 cm) above your elbow. It is best to wrap the cuff around bare skin. 6. Fit the cuff snugly around your arm. You should be able to place only one finger between the cuff and your arm. 7. Put the cord inside the groove of your elbow. 8. Press the power button. 9. Sit quietly while the cuff fills with air and loses air. 10. Write down the numbers on the screen. 11. Wait 2-3 minutes and then repeat steps 1-10. What do the numbers mean? Two numbers make up your blood pressure.  The first number is called systolic pressure. The second is called diastolic pressure. An example of a blood pressure reading is "120 over 80" (or 120/80). If you are an adult and  do not have a medical condition, use this guide to find out if your blood pressure is normal: Normal  First number: below 120.  Second number: below 80. Elevated  First number: 120-129.  Second number: below 80. Hypertension stage 1  First number: 130-139.  Second number: 80-89. Hypertension stage 2  First number: 140 or above.  Second number: 90 or above. Your blood pressure is above normal even if only the top or bottom number is above normal. Follow these instructions at home:  Check your blood pressure as often as your doctor tells you to.  Take your monitor to your next doctor's appointment. Your doctor will: ? Make sure you are using it correctly. ? Make sure it is working right.  Make sure you understand what your blood pressure numbers should be.  Tell your doctor if your medicines are causing side effects. Contact a doctor if:  Your blood pressure keeps being high. Get help right away if:  Your first blood pressure number is higher than 180.  Your second blood pressure number is higher than 120. This information is not intended to replace advice given to you by your health care provider. Make sure you discuss any questions you have with your health care provider. Document Released: 08/21/2008 Document Revised: 08/06/2016 Document Reviewed: 02/15/2016 Elsevier Interactive Patient Education  2019 [employer].

## 2018-11-25 NOTE — Progress Notes (Signed)
Patient presents to clinic today to establish care.  SUBJECTIVE: PMH:  Pt is an 83 yo female with pmh sig for HTN, DM II, HLD, GERD, neuropathy, seasonal allergies.  Pt states she was previously seen by Dr. Orville Govern.  Pt is followed by Cardiology, Dr. Verdis Prime.  HTN: -taking losartan-HCTZ 100-12.5 mg -denies HAs, CP, changes in vision -not always eating what she should  DM II: -dx'd in late 20s or 30s -Taking lantus 55 units QHS, novolog 15 units TID, and Metformin 500 mg BID -not always checking fsbs -states eating things she shouldn't -notes her mother had a h/o DM -has seen Endo in the past, but not currently  HLD: -taking crestor 20 mg daily -denies myalgias  Finger pain: -notes pain in multiple digits of both hands -also has numbness and tingling in L 5th, 4th, and 3rd digits -pain worse in evening -if bends fingers will have to use other hand to straighten them out -x "a while"  GERD: -taking OTC Nexium -states by the time she realizes she is having symptoms they have stopped.  Allergies: PCN- edema all over  Social Hx: Pt is retired.  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 Pt has 4 children.  Pt has a daughter who lives in the area.  Pt states she left her husband years ago.  Pt endorses EtOH use, states drinks white liquor.  Pt denies tobacco and drug use.  Health Maintenance: Mammogram -- 2019  Past Medical History:  Diagnosis Date  . Arthritis   . Blood transfusion without reported diagnosis   . Depression   . Diabetic neuropathy (HCC)   . Dizziness   . Frequent headaches   . Glaucoma   . Hyperlipidemia   . Hypertension   . Migraines   . Palpitations   . Urinary incontinence     Past Surgical History:  Procedure Laterality Date  . none      Current Outpatient Medications on File Prior to Visit  Medication Sig Dispense Refill  . aspirin EC 81 MG tablet Take 1 tablet (81 mg total) by mouth daily. 90  tablet 3  . AZO-CRANBERRY PO Take 2 capsules by mouth every morning.    . Cetirizine HCl (ZYRTEC ALLERGY PO) Take 1 tablet by mouth daily.    . Coenzyme Q10 (COQ10) 200 MG CAPS Take 1 capsule by mouth daily.    Marland Kitchen esomeprazole (NEXIUM) 20 MG capsule Take 20 mg by mouth daily at 12 noon.    . insulin aspart (NOVOLOG) 100 UNIT/ML injection Inject 15 Units into the skin 3 (three) times daily with meals.    . insulin glargine (LANTUS) 100 UNIT/ML injection Inject 55 Units into the skin at bedtime.     Marland Kitchen losartan-hydrochlorothiazide (HYZAAR) 100-25 MG tablet Take 1 tablet by mouth daily.    . metFORMIN (GLUCOPHAGE) 500 MG tablet Take 500 mg by mouth 2 (two) times daily.   2  . Multiple Vitamins-Minerals (CENTRUM PO) Take 1 tablet by mouth daily.    . Naproxen Sodium (ALEVE PO) Take 2 tablets by mouth daily as needed (pain).     . Omega-3 Fatty Acids (OMEGA 3 PO) Take 1 capsule by mouth daily.    Bertram Gala Glycol-Propyl Glycol (SYSTANE ULTRA OP) Apply 1 drop to eye daily.    . Probiotic Product (PROBIOTIC PO) Take 1 capsule by mouth at bedtime.    . rosuvastatin (CRESTOR) 20 MG tablet Take 20 mg by mouth  at bedtime.    Bernadette Hoit Sodium (SENOKOT S PO) Take 1 tablet by mouth 2 (two) times daily.    . TOPROL XL 25 MG 24 hr tablet Take 2 tablets (50 mg) by mouth each morning and 1 tablet (25 mg) by mouth each evening. 270 tablet 3   No current facility-administered medications on file prior to visit.     Allergies  Allergen Reactions  . Penicillins Anaphylaxis    Family History  Problem Relation Age of Onset  . Diabetes Mother   . Hypertension Mother   . Diabetes Maternal Grandfather     Social History   Socioeconomic History  . Marital status: Single    Spouse name: Not on file  . Number of children: 4  . Years of education: 61  . Highest education level: Not on file  Occupational History  . Occupation: retired  Engineer, production  . Financial resource strain: Not on file    . Food insecurity:    Worry: Not on file    Inability: Not on file  . Transportation needs:    Medical: Not on file    Non-medical: Not on file  Tobacco Use  . Smoking status: Never Smoker  . Smokeless tobacco: Never Used  Substance and Sexual Activity  . Alcohol use: Yes  . Drug use: No  . Sexual activity: Not Currently  Lifestyle  . Physical activity:    Days per week: Not on file    Minutes per session: Not on file  . Stress: Not on file  Relationships  . Social connections:    Talks on phone: Not on file    Gets together: Not on file    Attends religious service: Not on file    Active member of club or organization: Not on file    Attends meetings of clubs or organizations: Not on file    Relationship status: Not on file  . Intimate partner violence:    Fear of current or ex partner: Not on file    Emotionally abused: Not on file    Physically abused: Not on file    Forced sexual activity: Not on file  Other Topics Concern  . Not on file  Social History Narrative  . Not on file    ROS General: Denies fever, chills, night sweats, changes in weight, changes in appetite HEENT: Denies headaches, ear pain, changes in vision, rhinorrhea, sore throat CV: Denies CP, palpitations, SOB, orthopnea Pulm: Denies SOB, cough, wheezing GI: Denies abdominal pain, nausea, vomiting, diarrhea, constipation GU: Denies dysuria, hematuria, frequency, vaginal discharge Msk: Denies muscle cramps   +joint pains Neuro: Denies weakness   +numbness, tingling Skin: Denies rashes, bruising Psych: Denies depression, anxiety, hallucinations  BP (!) 152/82 (BP Location: Left Arm, Patient Position: Sitting, Cuff Size: Normal)   Pulse 80   Temp 98.7 F (37.1 C) (Oral)   Ht  (1.6 m)   Wt 170 lb (77.1 kg)   SpO2 97%   BMI 30.11 kg/m   Physical Exam Gen. Pleasant, well developed, well-nourished, in NAD.  Repeats the same stories throughout the visit HEENT - [place]/AT, PERRL, no scleral  icterus, no nasal drainage, pharynx without erythema or exudate.  TMs normal b/l, no cervical lymphadenopathy, no carotid bruits Lungs: no use of accessory muscles, CTAB, no wheezes, rales or rhonchi Cardiovascular: RRR, No r/g/m, no peripheral edema Abdomen: BS present, soft, nontender,nondistended Neuro:  A&Ox3, CN II-XII intact, normal gait Skin:  Warm, dry, intact, no lesions  No results found for this or any previous visit (from the past 2160 hour(s)).  Assessment/Plan: Essential hypertension -elevated -will repeat bp -pt encouraged to check bp at home -continue current medications  Losartan-HCTZ 100-12.5 mg and toprol XL 25 mg -Lifestyle modifications encouraged  Type 2 diabetes mellitus with diabetic neuropathy, with long-term current use of insulin (HCC)  -Continue Lantus 55 units nightly and NovoLog 15 units 3 times daily -Discussed lifestyle modifications including decreasing amount of sweets and carbohydrates eaten -Patient encouraged to check FSBS regularly and keep a log to bring with her to clinic -Given handout - Plan: POCT glycosylated hemoglobin (Hb A1C) 7.7%  Mixed hyperlipidemia -Continue Crestor 20 mg daily -Continue lifestyle modifications  Gastroesophageal reflux disease, esophagitis presence not specified -Avoid foods known to cause symptoms -Continue Nexium 20 mg  Trigger little finger of left hand  - Plan: Ambulatory referral to Orthopedic Surgery  Encounter to establish care -We reviewed the PMH, PSH, FH, SH, Meds and Allergies. -We provided refills for any medications we will prescribe as needed. -We addressed current concerns per orders and patient instructions. -We have asked for records for pertinent exams, studies, vaccines and notes from previous providers. -We have advised patient to follow up per instructions below.  F/u in the 1-2 months  Abbe Amsterdam, MD

## 2018-11-26 ENCOUNTER — Other Ambulatory Visit: Payer: Self-pay | Admitting: Family Medicine

## 2018-11-26 NOTE — Telephone Encounter (Signed)
Requested medication (s) are due for refill today: Yes  Requested medication (s) are on the active medication list: Yes  Last refill:  By historical provider  Future visit scheduled: Yes  Notes to clinic:  Unable to refill per protocol; also requesting pen needles 30g x42m 1/3 novafine or BD     Requested Prescriptions  Pending Prescriptions Disp Refills   losartan-hydrochlorothiazide (HYZAAR) 100-25 MG tablet      Sig: Take 1 tablet by mouth daily.     Cardiovascular: ARB + Diuretic Combos Failed - 11/26/2018 11:24 AM      Failed - K in normal range and within 180 days    No results found for: K, POTASSIUM       Failed - Na in normal range and within 180 days    No results found for: NA       Failed - Cr in normal range and within 180 days    No results found for: CREATININE       Failed - Ca in normal range and within 180 days    No results found for: CALCIUM, CORRECTEDCA, CAWHOLEBLD, POCCA       Failed - Last BP in normal range    BP Readings from Last 1 Encounters:  11/25/18 (!) 152/82         Passed - Patient is not pregnant      Passed - Valid encounter within last 6 months    Recent Outpatient Visits          Yesterday Essential hypertension   [facility]rap[facility] [place]      Future Appointments            In 1 month BVolanda Napoleon [place]         metFORMIN (GLUCOPHAGE) 500 MG tablet  2    Sig: Take 1 tablet (500 mg total) by mouth 2 (two) times daily.     Endocrinology:  Diabetes - Biguanides Failed - 11/26/2018 11:24 AM      Failed - Cr in normal range and within 360 days    No results found for: CREATININE       Failed - eGFR in normal range and within 360 days    No results found for: GFRAA, GFRNONAA, GFR       Passed - HBA1C is between 0 and 7.9 and within 180 days    Hemoglobin A1C  Date Value Ref Range Status  11/25/2018 7.7 (A) 4.0 - 5.6 % Final         Passed - Valid  encounter within last 6 months    Recent Outpatient Visits          Yesterday Essential hypertension   [facility]rapist, [place] [place]      Future Appointments            In 1 month BVolanda Napoleon [place]         rosuvastatin (CRESTOR) 20 MG tablet      Sig: Take 1 tablet (20 mg total) by mouth at bedtime.     Cardiovascular:  Antilipid - Statins Failed - 11/26/2018 11:24 AM      Failed - Total Cholesterol in normal range and within 360 days    No results found for: CHOL, POCCHOL       Failed - LDL in normal range and within 360 days  No results found for: LDLCALC, LDLC, HIRISKLDL       Failed - HDL in normal range and within 360 days    No results found for: HDL       Failed - Triglycerides in normal range and within 360 days    No results found for: TRIG       Passed - Patient is not pregnant      Passed - Valid encounter within last 12 months    Recent Outpatient Visits          Yesterday Essential hypertension   Therapist, music at [employer], Langley Adie, MD      Future Appointments            In 1 month Volanda Napoleon, Langley Adie, MD [employer] at [place], [facility]

## 2018-11-26 NOTE — Telephone Encounter (Signed)
Copied from CRM [id number]. Topic: Quick Communication - Rx Refill/Question >> Nov 26, 2018 10:37 AM Mickel Baas B, NT wrote: Medication: pen needles 30g x87mm 1/3 novafine or BD Losartan potassium 25mg  tablet (2 tablets by mouth daily) metFORMIN (GLUCOPHAGE) 500 MG tablet rosuvastatin (CRESTOR) 20 MG tablet     Has the patient contacted their pharmacy? Yes.   (Agent: If no, request that the patient contact the pharmacy for the refill.) (Agent: If yes, when and what did the pharmacy advise?)  Preferred Pharmacy (with phone number or street name): [id number] - [place], [place] - [address]  Agent: Please be advised that RX refills may take up to 3 business days. We ask that you follow-up with your pharmacy.

## 2018-11-29 MED ORDER — INSULIN PEN NEEDLE 30G X 8 MM MISC: 1 refills | Status: DC

## 2018-11-29 MED ORDER — METFORMIN HCL 500 MG PO TABS: 500.0000 mg | ORAL_TABLET | Freq: Two times a day (BID) | ORAL | 2 refills | Status: DC

## 2018-11-29 MED ORDER — ROSUVASTATIN CALCIUM 20 MG PO TABS: 20.0000 mg | ORAL_TABLET | Freq: Every day | ORAL | 1 refills | Status: DC

## 2018-11-29 MED ORDER — LOSARTAN POTASSIUM-HCTZ 100-25 MG PO TABS: 1.0000 | ORAL_TABLET | Freq: Every day | ORAL | 2 refills | Status: DC

## 2018-11-29 NOTE — Telephone Encounter (Signed)
Rx sent for pen needles.

## 2018-11-29 NOTE — Addendum Note (Signed)
Addended by: Johnella Moloney on: 11/29/2018 02:19 PM   Modules accepted: Orders

## 2018-11-29 NOTE — Telephone Encounter (Signed)
Ok to do pen needles.

## 2018-12-01 ENCOUNTER — Other Ambulatory Visit: Payer: Self-pay | Admitting: Family Medicine

## 2018-12-02 DIAGNOSIS — Z4689 Encounter for fitting and adjustment of other specified devices: Secondary | ICD-10-CM | POA: Diagnosis not present

## 2018-12-02 DIAGNOSIS — R32 Unspecified urinary incontinence: Secondary | ICD-10-CM | POA: Diagnosis not present

## 2018-12-02 NOTE — Telephone Encounter (Signed)
Please advise 

## 2018-12-06 NOTE — Telephone Encounter (Signed)
Pt wants this for a 90 day supply (3 boxes #300). Pt asking for this to be resent to the pharmacy.   [id number] [place], [place] - [address]

## 2018-12-27 ENCOUNTER — Ambulatory Visit: Payer: Medicare Other | Admitting: Family Medicine

## 2018-12-30 ENCOUNTER — Encounter: Payer: Self-pay | Admitting: Family Medicine

## 2018-12-30 ENCOUNTER — Other Ambulatory Visit: Payer: Self-pay

## 2018-12-30 ENCOUNTER — Ambulatory Visit (INDEPENDENT_AMBULATORY_CARE_PROVIDER_SITE_OTHER): Payer: Medicare Other | Admitting: Family Medicine

## 2018-12-30 DIAGNOSIS — E1140 Type 2 diabetes mellitus with diabetic neuropathy, unspecified: Secondary | ICD-10-CM

## 2018-12-30 DIAGNOSIS — Z794 Long term (current) use of insulin: Secondary | ICD-10-CM | POA: Diagnosis not present

## 2018-12-30 DIAGNOSIS — I10 Essential (primary) hypertension: Secondary | ICD-10-CM | POA: Diagnosis not present

## 2018-12-30 MED ORDER — INSULIN ASPART 100 UNIT/ML FLEXPEN: 15.0000 [IU] | PEN_INJECTOR | Freq: Three times a day (TID) | SUBCUTANEOUS | 3 refills | Status: DC

## 2018-12-30 MED ORDER — INSULIN GLARGINE 100 UNIT/ML SOLOSTAR PEN: 55.0000 [IU] | PEN_INJECTOR | Freq: Every day | SUBCUTANEOUS | 3 refills | Status: DC

## 2018-12-30 NOTE — Progress Notes (Signed)
Virtual Visit via Telephone Note  I connected with [name] on 12/30/18 at 10:00 AM EDT by telephone and verified that I am speaking with the correct person using two identifiers.   I discussed the limitations, risks, security and privacy concerns of performing an evaluation and management service by telephone and the availability of in person appointments. I also discussed with the patient that there may be a patient responsible charge related to this service. The patient expressed understanding and agreed to proceed.  Location patient: home Location provider: home office Participants present for the call: patient, provider Patient did not have a visit in the prior 7 days to address this/these issue(s).   History of Present Illness: Pt states she is doing well considering everything going on with COVID-19.  Pt was living in [place].  States her son's wife died.  Pt's son returned to work.  She plans on checking on him later today.  Pt states her medications were less expensive in [place] and [place].  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  DM II:  Pt states she has been eating a few more things that she shouldn't.  Has peanut butter crackers and some hot chocolate.  States her fsbs is a little higher than  fsbs 203 this am.  Will recheck in an hour.  States feels guilty about eating bad.  Pt requesting 90 day supply on her insulin.  Using lantus pens 55 units qhs and novolog pens 15 units TID.  HTN:  Taking toprol XL 50 mg in am and 25 mg in the evening, losartan-HCTZ 100-25 mg daily.   Observations/Objective: Patient sounds cheerful and well on the phone. I do not appreciate any SOB. Speech and thought processing are grossly intact. Patient reported vitals: fsbs 203  Assessment and Plan: HTN -encouraged to check bp -continue current meds -continue f/u with Cardiology, Dr. Katrinka Blazing  Type 2  diabetes with neuropathy, with long term insulin use -controlled -hgb A1C 7.7% on 11/25/2018 -fsbs 203 this am -lifestyle modifications encouraged.  Pt to make better food choices -continue lantus 55 units qhs and novolog 15 untis TID. -pt using pens not vials, info updated in chart -90 d supply of lantus and novolog sent to pt's pharmacy.   Follow Up Instructions: F/u in the next few months, sooner if needed.  I did not refer this patient for an OV in the next 24 hours for this/these issue(s).  I discussed the assessment and treatment plan with the patient. The patient was provided an opportunity to ask questions and all were answered. The patient agreed with the plan and demonstrated an understanding of the instructions.   The patient was advised to call back or seek an in-person evaluation if the symptoms worsen or if the condition fails to improve as anticipated.  More than 50% of over 33 minutes spent in total with the patient, counseling and/or coordinating care.   Deeann Saint, MD

## 2019-01-06 ENCOUNTER — Telehealth: Payer: Self-pay | Admitting: *Deleted

## 2019-01-06 NOTE — Telephone Encounter (Signed)
Information completed for a prior auth for Novolog Flexpen and faxed to [facility] at [phone].  Fax received from [place] [employer] stating the request was approved from 10/08/2018-01/06/2020.  [facility] and left a detailed message on the voicemail with this info.

## 2019-01-11 ENCOUNTER — Ambulatory Visit (INDEPENDENT_AMBULATORY_CARE_PROVIDER_SITE_OTHER): Payer: Self-pay | Admitting: Orthopaedic Surgery

## 2019-01-13 ENCOUNTER — Telehealth: Payer: Self-pay | Admitting: *Deleted

## 2019-01-13 NOTE — Telephone Encounter (Signed)
Copied from CRM [id number]. Topic: General - Inquiry >> Jan 13, 2019 12:32 PM Terisa Starr wrote: Reason for CRM: pt said she wants Dr Salomon Fick to call her. She would not give me any other information. Thanks

## 2019-01-13 NOTE — Telephone Encounter (Signed)
Spoke with pt request for Dr Salomon Fick call, states that she has a private and personal question, please Advise

## 2019-01-13 NOTE — Telephone Encounter (Signed)
Pt's call returned.  Pt has questions about her bill.  She has medicare.  States her initial visit was $395.  Her portion of the bill was $132, which she paid.  Pt states she needs to know if her bill will always be this high?  Pt previously lived in [place] and [place] and never had a bill this high.  Pt states she enjoys having this provider and does not want a new one, but is concerned at the cost if it will be this high each visit.  This provider stated she was unsure as to why her bill was so high, but will be happy to look into it.

## 2019-02-10 ENCOUNTER — Ambulatory Visit: Payer: Self-pay | Admitting: Orthopaedic Surgery

## 2019-02-21 DEATH — deceased

## 2019-03-15 ENCOUNTER — Ambulatory Visit: Payer: Medicare Other | Admitting: Orthopaedic Surgery

## 2019-04-05 ENCOUNTER — Ambulatory Visit: Payer: Medicare Other | Admitting: Orthopaedic Surgery

## 2019-05-10 DIAGNOSIS — M65331 Trigger finger, right middle finger: Secondary | ICD-10-CM | POA: Diagnosis not present

## 2019-05-10 DIAGNOSIS — G5603 Carpal tunnel syndrome, bilateral upper limbs: Secondary | ICD-10-CM | POA: Diagnosis not present

## 2019-05-10 DIAGNOSIS — M79642 Pain in left hand: Secondary | ICD-10-CM | POA: Diagnosis not present

## 2019-05-10 DIAGNOSIS — M65332 Trigger finger, left middle finger: Secondary | ICD-10-CM | POA: Diagnosis not present

## 2019-05-10 DIAGNOSIS — M79641 Pain in right hand: Secondary | ICD-10-CM | POA: Diagnosis not present

## 2019-06-02 DIAGNOSIS — Z4689 Encounter for fitting and adjustment of other specified devices: Secondary | ICD-10-CM | POA: Diagnosis not present

## 2019-06-02 DIAGNOSIS — R3915 Urgency of urination: Secondary | ICD-10-CM | POA: Diagnosis not present

## 2019-06-03 ENCOUNTER — Other Ambulatory Visit: Payer: Self-pay

## 2019-06-03 ENCOUNTER — Telehealth (INDEPENDENT_AMBULATORY_CARE_PROVIDER_SITE_OTHER): Payer: Medicare Other | Admitting: Family Medicine

## 2019-06-03 DIAGNOSIS — E1140 Type 2 diabetes mellitus with diabetic neuropathy, unspecified: Secondary | ICD-10-CM

## 2019-06-03 DIAGNOSIS — Z794 Long term (current) use of insulin: Secondary | ICD-10-CM

## 2019-06-03 DIAGNOSIS — I10 Essential (primary) hypertension: Secondary | ICD-10-CM

## 2019-06-03 NOTE — Progress Notes (Signed)
Virtual Visit via Telephone Note  I connected with Karen Powell on 06/03/19 at  9:30 AM EDT by telephone and verified that I am speaking with the correct person using two identifiers.   I discussed the limitations, risks, security and privacy concerns of performing an evaluation and management service by telephone and the availability of in person appointments. I also discussed with the patient that there may be a patient responsible charge related to this service. The patient expressed understanding and agreed to proceed.  Location patient: home Location provider: work or home office Participants present for the call: patient, provider Patient did not have a visit in the prior 7 days to address this/these issue(s).   History of Present Illness: Pt states she has been doing well.  She went to f/u with her [place] recently. Pt to wear braces on her wrist for carpal tunnel.  Wearing gloves during the day and brace at night.  Pt states checking bp every once and a while. States it has been close to 120/80.  Pt lives alone.  States her bs has been "pretty good".  Last fsbs reading in glucometer 283.  Pt states she was drinking Boost.  bs normally 120s per pt.  May have a little wine at night.  Pt to schedule f/u with Cards soon.  Pt has been staying at home since the COVID-19 pandemic.  May go to the grocery store or her children bring her groceries.  Philipp Ovens, pt's daughter recently had a "light stroke".  Pt does not like saying "good bye".  She prefers "talk to you later".   Observations/Objective: Patient sounds cheerful and well on the phone. I do not appreciate any SOB. Speech and thought processing are grossly intact. Patient reported vitals:  Assessment and Plan: Essential hypertension -controlled -continue losartan-HCTZ 100-25 mg and toprol XL 25 mg   Type 2 diabetes mellitus with diabetic neuropathy, with long-term current use of insulin  (HCC) -bs elevated. -last Hgb A1C 7.7% 11/25/18 -pt advised to check fsbs more frequently -lifestyle modifications strongly advised -continue lantus 55 units qhs, novolog 15 units TID with meals, metformin 500 mg BID. -f/u in clinic in 1-2 months   Follow Up Instructions:  I did not refer this patient for an OV in the next 24 hours for this/these issue(s).  I discussed the assessment and treatment plan with the patient. The patient was provided an opportunity to ask questions and all were answered. The patient agreed with the plan and demonstrated an understanding of the instructions.   The patient was advised to call back or seek an in-person evaluation if the symptoms worsen or if the condition fails to improve as anticipated.  I provided 30 minutes of non-face-to-face time during this encounter.   Billie Ruddy, MD

## 2019-06-07 DIAGNOSIS — R3915 Urgency of urination: Secondary | ICD-10-CM | POA: Diagnosis not present

## 2019-06-23 ENCOUNTER — Other Ambulatory Visit: Payer: Self-pay

## 2019-06-23 ENCOUNTER — Other Ambulatory Visit: Payer: Self-pay | Admitting: Family Medicine

## 2019-08-10 ENCOUNTER — Telehealth: Payer: Self-pay | Admitting: *Deleted

## 2019-08-10 NOTE — Telephone Encounter (Signed)
Copied from [place] [id number]. Topic: General - Other >> Jul 28, 2019  9:08 AM Rainey Pines A wrote: [employer] faxed form over today and would like a callback once received at [phone] >> Aug 09, 2019  3:02 PM Rainey Pines A wrote: Janett Billow called asking if any of the faxes that have been sent since the end of October have been received. Janett Billow is requesting a callback at [phone] Please advise >> Aug 02, 2019  2:44 PM Celene Kras A wrote: Elberta Fortis, calling regarding this fax. They are requesting confirmation. Please advise.    Form faxed over.

## 2019-08-11 ENCOUNTER — Telehealth: Payer: Self-pay | Admitting: *Deleted

## 2019-08-11 NOTE — Telephone Encounter (Signed)
Copied from [place] [id number]. Topic: General - Other >> Aug 11, 2019 10:16 AM Carolyn Stare wrote: Janett Billow [facility] faxed form over today and would like a callback once received at [phone],, sh said they have not rec the form back  And has a alt fax number [phone] it for a glucose meter

## 2019-08-12 NOTE — Telephone Encounter (Signed)
Noted  

## 2019-08-23 NOTE — Telephone Encounter (Signed)
Spoke with [facility] and confirmed that the glucose meter was sent to pt

## 2019-08-25 NOTE — Telephone Encounter (Signed)
Joneen Caraway calling again to check on the status of receiving pts medical records. Please advise .   [phone]

## 2019-08-25 NOTE — Telephone Encounter (Signed)
See note

## 2019-08-30 ENCOUNTER — Other Ambulatory Visit: Payer: Self-pay | Admitting: Family Medicine

## 2019-08-31 NOTE — Telephone Encounter (Signed)
Attempted to call [facility] no option of leaving a voicemail.

## 2019-09-07 NOTE — Telephone Encounter (Signed)
Pt DME  form was faxed to [facility] and confirmation received

## 2019-09-09 ENCOUNTER — Other Ambulatory Visit: Payer: Self-pay | Admitting: Family Medicine

## 2019-09-13 ENCOUNTER — Other Ambulatory Visit: Payer: Self-pay | Admitting: Interventional Cardiology

## 2019-09-19 ENCOUNTER — Other Ambulatory Visit: Payer: Self-pay | Admitting: Family Medicine

## 2019-09-22 ENCOUNTER — Telehealth: Payer: Self-pay | Admitting: *Deleted

## 2019-09-22 NOTE — Telephone Encounter (Signed)
FYI Spoke with Karen Powell states that she is not sick but she just wants to personally speak with Dr Volanda Napoleon. Karen Powell did not disclose the reason.

## 2019-09-22 NOTE — Telephone Encounter (Signed)
Copied from [place] [phone]. Topic: General - Other >> Sep 22, 2019  9:08 AM Keene Breath wrote: Reason for CRM: Patient called to speak with the doctor.  She stated that she has some questions to ask her.  CB# [phone]

## 2019-09-27 NOTE — Telephone Encounter (Signed)
Pt called.  Pt stated she keeps getting several calls from Medicare daily which she does not appreciate.  Did not expresses any other concerns.  Pt doing well otherwise.

## 2019-09-29 ENCOUNTER — Telehealth: Payer: Self-pay | Admitting: *Deleted

## 2019-09-29 NOTE — Telephone Encounter (Signed)
Copied from CRM [id number]. Topic: General - Other >> Sep 29, 2019 11:58 AM Dalphine Handing A wrote: Patient called to inform Dr. Salomon Fick that she is doing well and feeling okay.

## 2019-09-30 ENCOUNTER — Other Ambulatory Visit: Payer: Self-pay | Admitting: Interventional Cardiology

## 2019-09-30 NOTE — Telephone Encounter (Signed)
FYI

## 2019-10-07 ENCOUNTER — Telehealth: Payer: Self-pay | Admitting: Interventional Cardiology

## 2019-10-07 NOTE — Telephone Encounter (Signed)
Spoke with pt and she said she had cancelled her previous appt so her daughter could have it and she just wanted to let Dr. Tamala Julian know she was ok.  Advised pt we need to schedule her to see him as well since she is overdue. Pt willing to do a telephone visit.  Scheduled her for this visit in Feb.  Pt appreciative for call.      Virtual Visit Pre-Appointment Phone Call  "(Name), I am calling you today to discuss your upcoming appointment. We are currently trying to limit exposure to the virus that causes COVID-19 by seeing patients at home rather than in the office."  1. "What is the BEST phone number to call the day of the visit?" - include this in appointment notes  2. "Do you have or have access to (through a family member/friend) a smartphone with video capability that we can use for your visit?" a. If yes - list this number in appt notes as "cell" (if different from BEST phone #) and list the appointment type as a VIDEO visit in appointment notes b. If no - list the appointment type as a PHONE visit in appointment notes  3. Confirm consent - "In the setting of the current Covid19 crisis, you are scheduled for a (phone or video) visit with your provider on (date) at (time).  Just as we do with many in-office visits, in order for you to participate in this visit, we must obtain consent.  If you'd like, I can send this to your mychart (if signed up) or email for you to review.  Otherwise, I can obtain your verbal consent now.  All virtual visits are billed to your insurance company just like a normal visit would be.  By agreeing to a virtual visit, we'd like you to understand that the technology does not allow for your provider to perform an examination, and thus may limit your provider's ability to fully assess your condition. If your provider identifies any concerns that need to be evaluated in person, we will make arrangements to do so.  Finally, though the technology is pretty good, we cannot  assure that it will always work on either your or our end, and in the setting of a video visit, we may have to convert it to a phone-only visit.  In either situation, we cannot ensure that we have a secure connection.  Are you willing to proceed?" STAFF: Did the patient verbally acknowledge consent to telehealth visit? Document YES/NO here: YES  4. Advise patient to be prepared - "Two hours prior to your appointment, go ahead and check your blood pressure, pulse, oxygen saturation, and your weight (if you have the equipment to check those) and write them all down. When your visit starts, your provider will ask you for this information. If you have an Apple Watch or Kardia device, please plan to have heart rate information ready on the day of your appointment. Please have a pen and paper handy nearby the day of the visit as well."  5. Give patient instructions for MyChart download to smartphone OR Doximity/Doxy.me as below if video visit (depending on what platform provider is using)  6. Inform patient they will receive a phone call 15 minutes prior to their appointment time (may be from unknown caller ID) so they should be prepared to answer    TELEPHONE CALL NOTE  Karen Powell has been deemed a candidate for a follow-up tele-health visit to limit community exposure during the  Covid-19 pandemic. I spoke with the patient via phone to ensure availability of phone/video source, confirm preferred email & phone number, and discuss instructions and expectations.  I reminded Karen Powell to be prepared with any vital sign and/or heart rhythm information that could potentially be obtained via home monitoring, at the time of her visit. I reminded Karen Powell to expect a phone call prior to her visit.  Julio Sicks, RN 10/07/2019 11:54 AM   INSTRUCTIONS FOR DOWNLOADING THE MYCHART APP TO SMARTPHONE  - The patient must first make sure to have activated MyChart and know their login information - If  Apple, go to [employer] and type in MyChart in the search bar and download the app. If Android, ask patient to go to [employer] and type in [place] in the search bar and download the app. The app is free but as with any other app downloads, their phone may require them to verify saved payment information or Apple/Android password.  - The patient will need to then log into the app with their MyChart username and password, and select [facility] as their healthcare provider to link the account. When it is time for your visit, go to the MyChart app, find appointments, and click Begin Video Visit. Be sure to Select Allow for your device to access the Microphone and Camera for your visit. You will then be connected, and your provider will be with you shortly.  **If they have any issues connecting, or need assistance please contact MyChart service desk [phone])**  **If using a computer, in order to ensure the best quality for their visit they will need to use either of the following Internet Browsers: [employer] or Google Chrome**  IF USING DOXIMITY or DOXY.ME - The patient will receive a link just prior to their visit by text.     FULL LENGTH CONSENT FOR TELE-HEALTH VISIT   I hereby voluntarily request, consent and authorize [facility] and its employed or contracted physicians, physician assistants, nurse practitioners or other licensed health care professionals (the Practitioner), to provide me with telemedicine health care services (the "Services") as deemed necessary by the treating Practitioner. I acknowledge and consent to receive the Services by the Practitioner via telemedicine. I understand that the telemedicine visit will involve communicating with the Practitioner through live audiovisual communication technology and the disclosure of certain medical information by electronic transmission. I acknowledge that I have been given the opportunity to request an  in-person assessment or other available alternative prior to the telemedicine visit and am voluntarily participating in the telemedicine visit.  I understand that I have the right to withhold or withdraw my consent to the use of telemedicine in the course of my care at any time, without affecting my right to future care or treatment, and that the Practitioner or I may terminate the telemedicine visit at any time. I understand that I have the right to inspect all information obtained and/or recorded in the course of the telemedicine visit and may receive copies of available information for a reasonable fee.  I understand that some of the potential risks of receiving the Services via telemedicine include:  Marland Kitchen Delay or interruption in medical evaluation due to technological equipment failure or disruption; . Information transmitted may not be sufficient (e.g. poor resolution of images) to allow for appropriate medical decision making by the Practitioner; and/or  . In rare instances, security protocols could fail, causing a breach of personal health information.  Furthermore,  I acknowledge that it is my responsibility to provide information about my medical history, conditions and care that is complete and accurate to the best of my ability. I acknowledge that Practitioner's advice, recommendations, and/or decision may be based on factors not within their control, such as incomplete or inaccurate data provided by me or distortions of diagnostic images or specimens that may result from electronic transmissions. I understand that the practice of medicine is not an exact science and that Practitioner makes no warranties or guarantees regarding treatment outcomes. I acknowledge that I will receive a copy of this consent concurrently upon execution via email to the email address I last provided but may also request a printed copy by calling the office of [facility].    I understand that my insurance will be  billed for this visit.   I have read or had this consent read to me. . I understand the contents of this consent, which adequately explains the benefits and risks of the Services being provided via telemedicine.  . I have been provided ample opportunity to ask questions regarding this consent and the Services and have had my questions answered to my satisfaction. . I give my informed consent for the services to be provided through the use of telemedicine in my medical care  By participating in this telemedicine visit I agree to the above.

## 2019-10-07 NOTE — Telephone Encounter (Signed)
Patient is calling stating that she would like to speak to Dr. Katrinka Blazing in regards to her health. Patient refused to state specifics. Please call.

## 2019-10-10 ENCOUNTER — Other Ambulatory Visit: Payer: Self-pay | Admitting: Family Medicine

## 2019-10-10 ENCOUNTER — Telehealth: Payer: Self-pay | Admitting: Interventional Cardiology

## 2019-10-10 ENCOUNTER — Other Ambulatory Visit: Payer: Self-pay | Admitting: Interventional Cardiology

## 2019-10-10 DIAGNOSIS — Z794 Long term (current) use of insulin: Secondary | ICD-10-CM

## 2019-10-10 DIAGNOSIS — E1140 Type 2 diabetes mellitus with diabetic neuropathy, unspecified: Secondary | ICD-10-CM

## 2019-10-10 MED ORDER — TOPROL XL 25 MG PO TB24: ORAL_TABLET | ORAL | 0 refills | Status: DC

## 2019-10-10 NOTE — Telephone Encounter (Signed)
Pt's medication was only sent to pt's pharmacy for a 30 day supply because pt has not been seen since 2019. Confirmation received.

## 2019-10-10 NOTE — Telephone Encounter (Signed)
Pt's medication Toprol 25 mg tablet was sent to pt's pharmacy for a 30 day supply because pt has not been seen since 2019 and pt has an upcoming appt in February. Pt wants a 90 day supply sent to her pharmacy. Would Dr. Katrinka Blazing like to refill pt's medication for a 90 day supply. Please address

## 2019-10-10 NOTE — Telephone Encounter (Signed)
*  STAT* If patient is at the pharmacy, call can be transferred to refill team.   1. Which medications need to be refilled? (please list name of each medication and dose if known) Tprol XL  2. Which pharmacy/location (including street and city if local pharmacy) is medication to be sent to? [phone]  3. Do they need a 30 day or 90 day supply? She need 90 days and not 30 please and refills

## 2019-10-10 NOTE — Telephone Encounter (Signed)
Ok to give one 90 day supply with no refills with a note to keep appt for further refills.

## 2019-10-13 DIAGNOSIS — N816 Rectocele: Secondary | ICD-10-CM | POA: Diagnosis not present

## 2019-10-13 DIAGNOSIS — N8110 Cystocele, unspecified: Secondary | ICD-10-CM | POA: Diagnosis not present

## 2019-10-13 DIAGNOSIS — Z4689 Encounter for fitting and adjustment of other specified devices: Secondary | ICD-10-CM | POA: Diagnosis not present

## 2019-10-19 ENCOUNTER — Other Ambulatory Visit: Payer: Self-pay

## 2019-10-19 ENCOUNTER — Telehealth: Payer: Self-pay | Admitting: Family Medicine

## 2019-10-19 DIAGNOSIS — E1140 Type 2 diabetes mellitus with diabetic neuropathy, unspecified: Secondary | ICD-10-CM

## 2019-10-19 MED ORDER — INSULIN ASPART 100 UNIT/ML FLEXPEN: 15.0000 [IU] | PEN_INJECTOR | Freq: Three times a day (TID) | SUBCUTANEOUS | 3 refills | Status: DC

## 2019-10-19 NOTE — Telephone Encounter (Signed)
Pt would like a call from [place] in regards to her medication Novolog (the one that goes into her arm)  Pt is needing a refill and she has questions about her mediation  Pt can be reached at [phone]

## 2019-10-19 NOTE — Telephone Encounter (Signed)
[phone] [address] [place], [place] [address]) Pt is almost out. Pt may be contacted at [phone] if needed.

## 2019-10-19 NOTE — Telephone Encounter (Signed)
Spoke with pt advised that her Novolog refill has been sent to her pharmacy, pt is aware that I spoke with [facility] regarding her Sensor for her Karen Powell meter, the Rx written was for lifetime and does npt require to call the office for refill, [facility] will automatically ship the sensor to pt when its time. Pt advised that [facility] delivered her sensor at her apartment office on Monday. Pt verbalized understanding.

## 2019-10-19 NOTE — Telephone Encounter (Signed)
Pt refill for novolog sent to her pharmacy, pt is aware

## 2019-11-15 NOTE — Progress Notes (Unsigned)
{  Choose 1 Note Type (Telehealth Visit or Telephone Visit):[phone]}   Date:  11/15/2019   ID:  Karen Powell, DOB [birth year], MRN [id number]  {Patient Location:[phone]::"Home"} {Provider Location:[id number]::"Home"}  PCP:  Deeann Saint, MD  Cardiologist:  Lesleigh Noe, MD *** Electrophysiologist:  None   Evaluation Performed:  {Choose Visit [phone]::"Follow-Up Visit"}  Chief Complaint:  ***  History of Present Illness:    Karen Powell is a 84 y.o. female with hypertension, type 2 diabetes mellitus, hyperlipidemia, and recurrent palpitations..   ***  The patient {does/does not:200015} have symptoms concerning for COVID-19 infection (fever, chills, cough, or new shortness of breath).    Past Medical History:  Diagnosis Date  . Arthritis   . Blood transfusion without reported diagnosis   . Depression   . Diabetic neuropathy (HCC)   . Dizziness   . Frequent headaches   . Glaucoma   . Hyperlipidemia   . Hypertension   . Migraines   . Palpitations   . Urinary incontinence    Past Surgical History:  Procedure Laterality Date  . none       No outpatient medications have been marked as taking for the 11/16/19 encounter (Appointment) with Lyn Records, MD.     Allergies:   Penicillins   Social History   Tobacco Use  . Smoking status: Never Smoker  . Smokeless tobacco: Never Used  Substance Use Topics  . Alcohol use: Yes  . Drug use: No     Family Hx: The patient's family history includes Diabetes in her maternal grandfather and mother; Hypertension in her mother.  ROS:   Please see the history of present illness.    *** All other systems reviewed and are negative.   Prior CV studies:   The following studies were reviewed today:  ***  Labs/Other Tests and Data Reviewed:    EKG:  {EKG/Telemetry Strips Reviewed:[phone]}  Recent Labs: No results found for requested labs within last 8760 hours.   Recent Lipid Panel No results  found for: CHOL, TRIG, HDL, CHOLHDL, LDLCALC, LDLDIRECT  Wt Readings from Last 3 Encounters:  11/25/18 170 lb (77.1 kg)  07/21/18 172 lb 1.9 oz (78.1 kg)  03/30/17 158 lb (71.7 kg)     Objective:    Vital Signs:  There were no vitals taken for this visit.   {[facility] Virtual Exam (Optional):[phone]::"VITAL SIGNS:  reviewed"}  ASSESSMENT & PLAN:    1. ***  COVID-19 Education: The signs and symptoms of COVID-19 were discussed with the patient and how to seek care for testing (follow up with PCP or arrange E-visit).  ***The importance of social distancing was discussed today.  Time:   Today, I have spent *** minutes with the patient with telehealth technology discussing the above problems.     Medication Adjustments/Labs and Tests Ordered: Current medicines are reviewed at length with the patient today.  Concerns regarding medicines are outlined above.   Tests Ordered: No orders of the defined types were placed in this encounter.   Medication Changes: No orders of the defined types were placed in this encounter.   Follow Up:  {F/U [phone]} {follow up:15908}  Signed, Lesleigh Noe, MD  11/15/2019 6:10 PM    [facility]

## 2019-11-16 ENCOUNTER — Telehealth: Payer: Medicare Other | Admitting: Interventional Cardiology

## 2019-11-16 ENCOUNTER — Other Ambulatory Visit: Payer: Self-pay

## 2019-11-16 DIAGNOSIS — Z794 Long term (current) use of insulin: Secondary | ICD-10-CM

## 2019-11-16 DIAGNOSIS — Z7189 Other specified counseling: Secondary | ICD-10-CM

## 2019-11-16 DIAGNOSIS — R002 Palpitations: Secondary | ICD-10-CM

## 2019-11-16 DIAGNOSIS — E118 Type 2 diabetes mellitus with unspecified complications: Secondary | ICD-10-CM

## 2019-11-16 DIAGNOSIS — I10 Essential (primary) hypertension: Secondary | ICD-10-CM

## 2019-11-16 DIAGNOSIS — E782 Mixed hyperlipidemia: Secondary | ICD-10-CM

## 2019-11-18 ENCOUNTER — Telehealth: Payer: Self-pay | Admitting: Interventional Cardiology

## 2019-11-18 ENCOUNTER — Telehealth: Payer: Self-pay | Admitting: *Deleted

## 2019-11-18 ENCOUNTER — Telehealth: Payer: Self-pay

## 2019-11-18 NOTE — Telephone Encounter (Signed)
Patient called office to speak to Harriett Sine, called transferred to [facility].

## 2019-11-18 NOTE — Telephone Encounter (Signed)
Patient calling to reschedule her virtual appointment with Dr. Katrinka Blazing. She states it has to be virtual and with Dr. Katrinka Blazing.

## 2019-11-18 NOTE — Telephone Encounter (Signed)
Spoke with pt and scheduled her for a phone visit with Dr. Katrinka Blazing in April.  Pt appreciative for call.

## 2019-11-18 NOTE — Telephone Encounter (Signed)
Pt called office to notify Dr Salomon Fick that she received the new Freestyle Libre system and that  pt checks her blood sugars 3 times daily, pt states that she continues to watch her diet and that her Blood sugars are in the 130s at the Am and 190s in the pm, pt states that she wanted to notify Dr Salomon Fick that she is well and to give the update of her Blood sugars.

## 2019-12-22 ENCOUNTER — Other Ambulatory Visit: Payer: Self-pay | Admitting: Family Medicine

## 2019-12-22 MED ORDER — NOVOFINE 32G X 6 MM MISC: 0 refills | Status: DC

## 2019-12-22 NOTE — Telephone Encounter (Signed)
Medication: Insulins Pen Needle 90 day supply Pharmacy: [facility]

## 2019-12-22 NOTE — Telephone Encounter (Signed)
Needles have been sent in

## 2020-01-02 NOTE — Progress Notes (Signed)
Cardiology Office Note:    Date:  01/03/2020   ID:  [name], DOB 1935/11/17, MRN [id number]  PCP:  Deeann Saint, MD  Cardiologist:  Lesleigh Noe, MD   Referring MD: Deeann Saint, MD   Chief Complaint  Patient presents with  . Hypertension    History of Present Illness:    Karen Powell is a 84 y.o. female with a hx of hypertension and palpitations.  Tangential conversation ranging from her combative relationship with her daughter, the death of her son-in-law, and social distancing to the extreme during the COVID-19 pandemic.  Expressed a high fidelity with natural therapies, of which she brings in a bag of supplements that she takes daily.  Her medications are organized and pill containers.  Multiple tablets every day.  She has not taken Toprol-XL or losartan HCTZ yet today.  She uses salt liberally in her diet.  Past Medical History:  Diagnosis Date  . Arthritis   . Blood transfusion without reported diagnosis   . Depression   . Diabetic neuropathy (HCC)   . Dizziness   . Frequent headaches   . Glaucoma   . Hyperlipidemia   . Hypertension   . Migraines   . Palpitations   . Urinary incontinence     Past Surgical History:  Procedure Laterality Date  . none      Current Medications: Current Meds  Medication Sig  . Apoaequorin (PREVAGEN PO) Take 1 capsule by mouth daily.  Marland Kitchen aspirin EC 81 MG tablet Take 1 tablet (81 mg total) by mouth daily.  . Cetirizine HCl (ZYRTEC ALLERGY PO) Take 1 tablet by mouth daily.  . Coenzyme Q10 (COQ10) 200 MG CAPS Take 1 capsule by mouth daily.  Marland Kitchen esomeprazole (NEXIUM) 20 MG capsule Take 20 mg by mouth daily at 12 noon.  . insulin aspart (NOVOLOG) 100 UNIT/ML FlexPen Inject 15 Units into the skin 3 (three) times daily with meals.  . Insulin Pen Needle (NOVOFINE) 32G X 6 MM MISC Please specify directions, refills and quantity  . LANTUS SOLOSTAR 100 UNIT/ML Solostar Pen INJECT 55 UNITS INTO THE SKIN AT BEDTIME.  Marland Kitchen  losartan-hydrochlorothiazide (HYZAAR) 100-25 MG tablet TAKE 1 TABLET BY MOUTH EVERY DAY  . metFORMIN (GLUCOPHAGE) 500 MG tablet TAKE 1 TABLET BY MOUTH TWICE A DAY  . Multiple Vitamins-Minerals (CENTRUM PO) Take 1 tablet by mouth daily.  . Naproxen Sodium (ALEVE PO) Take 2 tablets by mouth daily as needed (pain).   . Omega-3 Fatty Acids (OMEGA 3 PO) Take 1 capsule by mouth daily.  Bertram Gala Glycol-Propyl Glycol (SYSTANE ULTRA OP) Apply 1 drop to eye daily.  . Probiotic Product (PROBIOTIC PO) Take 1 capsule by mouth at bedtime.  . rosuvastatin (CRESTOR) 20 MG tablet TAKE 1 TABLET BY MOUTH EVERYDAY AT BEDTIME  . Sennosides-Docusate Sodium (SENOKOT S PO) Take 1 tablet by mouth 2 (two) times daily.  . TOPROL XL 25 MG 24 hr tablet Take 2 tablets (50 mg) by mouth each morning and 1 tablet (25 mg) by mouth each evening. Please keep upcoming appt in February before anymore refills. Thank you  . Turmeric 500 MG TABS Take 1 tablet by mouth daily.     Allergies:   Penicillins   Social History   Socioeconomic History  . Marital status: Single    Spouse name: Not on file  . Number of children: 4  . Years of education: 64  . Highest education level: Not on file  Occupational History  .  Occupation: retired  Tobacco Use  . Smoking status: Never Smoker  . Smokeless tobacco: Never Used  Substance and Sexual Activity  . Alcohol use: Yes  . Drug use: No  . Sexual activity: Not Currently  Other Topics Concern  . Not on file  Social History Narrative  . Not on file   Social Determinants of Health   Financial Resource Strain:   . Difficulty of Paying Living Expenses:   Food Insecurity:   . Worried About Programme researcher, broadcasting/film/video in the Last Year:   . Barista in the Last Year:   Transportation Needs:   . Freight forwarder (Medical):   Marland Kitchen Lack of Transportation (Non-Medical):   Physical Activity:   . Days of Exercise per Week:   . Minutes of Exercise per Session:   Stress:   .  Feeling of Stress :   Social Connections:   . Frequency of Communication with Friends and Family:   . Frequency of Social Gatherings with Friends and Family:   . Attends Religious Services:   . Active Member of Clubs or Organizations:   . Attends Banker Meetings:   Marland Kitchen Marital Status:      Family History: The patient's family history includes Diabetes in her maternal grandfather and mother; Hypertension in her mother.  ROS:   Please see the history of present illness.    She does not sleep well, her skin itches, she sees objects on the ceiling at night, says she paid $6000 to moved to [facility] and it is not been beneficial.  She could not express a specific complaint concerning her own health.  Despite elevated blood pressure today, she states she feels well.  All other systems reviewed and are negative.  EKGs/Labs/Other Studies Reviewed:    The following studies were reviewed today: No new data  EKG:  EKG normal sinus rhythm, prominent voltage, leftward axis, poor R wave progression V1 through V4, with normal PR interval.  Compared with the prior tracing from 01/03/2020, no significant change has occurred.  Recent Labs: No results found for requested labs within last 8760 hours.  Recent Lipid Panel No results found for: CHOL, TRIG, HDL, CHOLHDL, VLDL, LDLCALC, LDLDIRECT  Physical Exam:    VS:  BP (!) 192/90   Pulse 67   Ht 5\' 3"  (1.6 m)   Wt 166 lb 12.8 oz (75.7 kg)   SpO2 98%   BMI 29.55 kg/m     Wt Readings from Last 3 Encounters:  01/03/20 166 lb 12.8 oz (75.7 kg)  11/25/18 170 lb (77.1 kg)  07/21/18 172 lb 1.9 oz (78.1 kg)     GEN: Appears younger than stated age. No acute distress HEENT: Normal NECK: No JVD. LYMPHATICS: No lymphadenopathy CARDIAC:  RRR without murmur, gallop, or edema. VASCULAR:  Normal Pulses. No bruits. RESPIRATORY:  Clear to auscultation without rales, wheezing or rhonchi  ABDOMEN: Soft, non-tender, non-distended, No pulsatile  mass, MUSCULOSKELETAL: No deformity  SKIN: Warm and dry NEUROLOGIC:  Alert and oriented x 3 PSYCHIATRIC:  Normal affect   ASSESSMENT:    1. Essential hypertension   2. Palpitations   3. Type 2 diabetes mellitus with complication, with long-term current use of insulin (HCC)   4. Mixed hyperlipidemia   5. Educated about COVID-19 virus infection   6. Cognitive changes    PLAN:    In order of problems listed above:  1. Poor blood pressure control, no medication yet today (raising a question  concerning compliance on a daily basis), and plentiful salt added to her diet.  No physical activity.  Encouraged to take her antihypertensive therapy as soon as she arrives home from this office visit.  Please continue losartan HCTZ 100/25 mg/day and Toprol-XL 50 mg a.m. and 25 mg p.m. 2. No complaint of palpitations. 3. Most recent hemoglobin A1c was March 2020 where it was 7.7.  She will be seeing Dr. Volanda Napoleon in the not too distant future. 4. Current lipid data.  She is against having blood work drawn today. 5. She is hesitant to take the vaccine and may never take it.  She is content with mask wearing and social distancing. 6. Cognitive status/psychiatric status needs to be evaluated as from a conversational standpoint, it seems to be some issues.  These include anger, memory, and possible hallucinations.  Urged patient to exercise, lose weight, take prescribed medications on a daily basis, and to get greater than 6 hours of sleep each night.  Monitor blood pressure at home.  Notify us if 2-hour post ingestion of antihypertensive therapy allows blood pressures of greater than 140/80 mmHg.  Not sure how much good we are doing.  She has her on thoughts about things and is caring for herself with naturopathic remedies.  We will see her in 1 year or as needed.   Medication Adjustments/Labs and Tests Ordered: Current medicines are reviewed at length with the patient today.  Concerns regarding medicines  are outlined above.  Orders Placed This Encounter  Procedures  . EKG 12-Lead   No orders of the defined types were placed in this encounter.   Patient Instructions  Medication Instructions:  Your physician recommends that you continue on your current medications as directed. Please refer to the Current Medication list given to you today.  *If you need a refill on your cardiac medications before your next appointment, please call your pharmacy*   Lab Work: None If you have labs (blood work) drawn today and your tests are completely normal, you will receive your results only by: Marland Kitchen MyChart Message (if you have MyChart) OR . A paper copy in the mail If you have any lab test that is abnormal or we need to change your treatment, we will call you to review the results.   Testing/Procedures: None   Follow-Up: At [facility], you and your health needs are our priority.  As part of our continuing mission to provide you with exceptional heart care, we have created designated Provider Care Teams.  These Care Teams include your primary Cardiologist (physician) and Advanced Practice Providers (APPs -  Physician Assistants and Nurse Practitioners) who all work together to provide you with the care you need, when you need it.  We recommend signing up for the patient portal called "MyChart".  Sign up information is provided on this After Visit Summary.  MyChart is used to connect with patients for Virtual Visits (Telemedicine).  Patients are able to view lab/test results, encounter notes, upcoming appointments, etc.  Non-urgent messages can be sent to your provider as well.   To learn more about what you can do with MyChart, go to [email or web address].    Your next appointment:   12 month(s)  The format for your next appointment:   In Person  Provider:   You may see Sinclair Grooms, MD or one of the following Advanced Practice Providers on your designated Care Team:    Truitt Merle,  NP  Cecilie Kicks, NP  Kathyrn Drown,  NP    Other Instructions      Signed, Lesleigh Noe, MD  01/03/2020 9:08 AM    [facility]

## 2020-01-03 ENCOUNTER — Ambulatory Visit (INDEPENDENT_AMBULATORY_CARE_PROVIDER_SITE_OTHER): Payer: Medicare Other | Admitting: Interventional Cardiology

## 2020-01-03 ENCOUNTER — Encounter: Payer: Self-pay | Admitting: Interventional Cardiology

## 2020-01-03 ENCOUNTER — Other Ambulatory Visit: Payer: Self-pay

## 2020-01-03 VITALS — BP 192/90 | HR 67 | Ht 63.0 in | Wt 166.8 lb

## 2020-01-03 DIAGNOSIS — I10 Essential (primary) hypertension: Secondary | ICD-10-CM | POA: Diagnosis not present

## 2020-01-03 DIAGNOSIS — E118 Type 2 diabetes mellitus with unspecified complications: Secondary | ICD-10-CM | POA: Diagnosis not present

## 2020-01-03 DIAGNOSIS — R002 Palpitations: Secondary | ICD-10-CM

## 2020-01-03 DIAGNOSIS — Z7189 Other specified counseling: Secondary | ICD-10-CM

## 2020-01-03 DIAGNOSIS — E782 Mixed hyperlipidemia: Secondary | ICD-10-CM

## 2020-01-03 DIAGNOSIS — Z794 Long term (current) use of insulin: Secondary | ICD-10-CM | POA: Diagnosis not present

## 2020-01-03 DIAGNOSIS — R4189 Other symptoms and signs involving cognitive functions and awareness: Secondary | ICD-10-CM

## 2020-01-03 NOTE — Patient Instructions (Signed)

## 2020-01-08 ENCOUNTER — Other Ambulatory Visit: Payer: Self-pay | Admitting: Interventional Cardiology

## 2020-01-09 ENCOUNTER — Other Ambulatory Visit: Payer: Self-pay | Admitting: Family Medicine

## 2020-01-09 ENCOUNTER — Telehealth: Payer: Self-pay | Admitting: Family Medicine

## 2020-01-09 DIAGNOSIS — Z794 Long term (current) use of insulin: Secondary | ICD-10-CM

## 2020-01-09 DIAGNOSIS — E1140 Type 2 diabetes mellitus with diabetic neuropathy, unspecified: Secondary | ICD-10-CM

## 2020-01-09 NOTE — Telephone Encounter (Signed)
Pt called in about her medication refill for the needles, explained it was sent to the pharmacy on 12/22/19. Pt advised pharmacy explained to her they do not have the prescription from her provider. Confirmed pharmacy with pt.   Pt would like new prescription for needs to be sent to the pharmacy along with 90 day supply of all of her medications.

## 2020-01-09 NOTE — Telephone Encounter (Signed)
Pt needs to schedule appointment for further refills 

## 2020-01-09 NOTE — Telephone Encounter (Signed)
Spoke with pt pharmacy state that pt refill for her needles is ready for pick up, attempted to call pt no option to leave a message on pt phone. Will try again

## 2020-01-23 ENCOUNTER — Telehealth: Payer: Self-pay | Admitting: Family Medicine

## 2020-01-23 NOTE — Telephone Encounter (Signed)
Pt would like to touch base with her PCP. I tried to obtain more information and the pt stated she is not sick but needs to speak to her. She would not inform me on what it was regarding and she denied setting up a telephone visit.   Pt can be reached at [phone]

## 2020-01-24 NOTE — Telephone Encounter (Signed)
Spoke with pt scheduled for virtual visit tomorrow with Dr Salomon Fick

## 2020-01-25 ENCOUNTER — Encounter: Payer: Self-pay | Admitting: Family Medicine

## 2020-01-25 ENCOUNTER — Telehealth (INDEPENDENT_AMBULATORY_CARE_PROVIDER_SITE_OTHER): Payer: Medicare Other | Admitting: Family Medicine

## 2020-01-25 DIAGNOSIS — I10 Essential (primary) hypertension: Secondary | ICD-10-CM

## 2020-01-25 DIAGNOSIS — R413 Other amnesia: Secondary | ICD-10-CM

## 2020-01-25 DIAGNOSIS — Z7189 Other specified counseling: Secondary | ICD-10-CM

## 2020-01-25 DIAGNOSIS — E1140 Type 2 diabetes mellitus with diabetic neuropathy, unspecified: Secondary | ICD-10-CM

## 2020-01-25 DIAGNOSIS — Z794 Long term (current) use of insulin: Secondary | ICD-10-CM | POA: Diagnosis not present

## 2020-01-25 NOTE — Progress Notes (Signed)
Virtual Visit via Telephone Note  I connected with Karen Powell on 01/25/20 at  1:30 PM EDT by telephone and verified that I am speaking with the correct person using two identifiers.   I discussed the limitations, risks, security and privacy concerns of performing an evaluation and management service by telephone and the availability of in person appointments. I also discussed with the patient that there may be a patient responsible charge related to this service. The patient expressed understanding and agreed to proceed.  Location patient: home Location provider: work or home office Participants present for the call: patient, provider Patient did not have a visit in the prior 7 days to address this/these issue(s).   History of Present Illness: Pt is an 84 yo female with pmh sig for migraines, HTN, GERD, DM, arthritis, h/o depression, HLD, h/o glaucoma.  Pt initially had trouble stating the day in her birthday, stated [birth year], but was able to correct it. Pt states she is hoping to move soon.  Notes issues with bugs on her window at home.  Pt mentions there are dark marks on her skin.  Thinks they are from popcorn ceiling or bugs?  Pt states she was stressed recently after hearing some news from her son about the family.      Observations/Objective: Patient sounds cheerful and well on the phone. I do not appreciate any SOB. Speech and thought processing are tangential.   A&Ox3.  Pt able to state the month, date, year, city, county, state  Patient reported vitals:  Assessment and Plan: Memory changes -pt A&O x 3 during visit, however would benefit from in person memory testing. -Discussed follow-up appointment in the next few weeks for further memory testing and labs -Ensure patient will make appointment 2/2 transportation issues and concerns about Covid.  Essential hypertension -Continue current medications -Discussed lifestyle changes  Type 2 diabetes mellitus with diabetic  neuropathy, with long-term current use of insulin (HCC) -Last hemoglobin A1c 7.7% on 11/25/2018 -Continue current medications -Continue lifestyle modifications -We will plan for in person visit for labs, foot exam, microalbumin  Educated about COVID-19 virus infection -discussed s/s of COVID -pt encouraged to get COVID vaccine. Pt declines. Pt content with taking vitamin supplements.  Follow Up Instructions: F/u in person in the next few wks.  Pt states she will come in when she can find transportation and when COIVD dies down.  I did not refer this patient for an OV in the next 24 hours for this/these issue(s).  I discussed the assessment and treatment plan with the patient. The patient was provided an opportunity to ask questions and all were answered. The patient agreed with the plan and demonstrated an understanding of the instructions.   The patient was advised to call back or seek an in-person evaluation if the symptoms worsen or if the condition fails to improve as anticipated.  I provided 25 minutes of non-face-to-face time during this encounter.   Deeann Saint, MD

## 2020-03-01 DIAGNOSIS — Z23 Encounter for immunization: Secondary | ICD-10-CM | POA: Diagnosis not present

## 2020-03-26 ENCOUNTER — Other Ambulatory Visit: Payer: Self-pay | Admitting: Family Medicine

## 2020-03-26 DIAGNOSIS — Z794 Long term (current) use of insulin: Secondary | ICD-10-CM

## 2020-03-26 DIAGNOSIS — E1140 Type 2 diabetes mellitus with diabetic neuropathy, unspecified: Secondary | ICD-10-CM

## 2020-03-27 ENCOUNTER — Other Ambulatory Visit: Payer: Self-pay | Admitting: Family Medicine

## 2020-03-27 ENCOUNTER — Other Ambulatory Visit: Payer: Self-pay

## 2020-03-27 MED ORDER — BD PEN NEEDLE MICRO U/F 32G X 6 MM MISC: 3 refills | Status: DC

## 2020-03-30 DIAGNOSIS — Z23 Encounter for immunization: Secondary | ICD-10-CM | POA: Diagnosis not present

## 2020-04-03 ENCOUNTER — Telehealth: Payer: Self-pay | Admitting: Family Medicine

## 2020-04-03 NOTE — Telephone Encounter (Signed)
Spoke to pt and advised that per last office note pt was to return in 02/2020 for lab and foot exam. Pt notified of note and stated she will call back to schedule the appt. After she finds out when her daughter can bring her next week. Pt stated that she has 2 pens left.

## 2020-04-03 NOTE — Telephone Encounter (Addendum)
Pt stated that for all her medications she gets 90 day supply but her last refill on NOVOLOG and LANTUS she only got 30 days so she did not pick it up. She wants it sent back in for 90days. She only has 4 left of the Novolog and 3 of Lantus   Pharmacy:  [id number] [address], [place] - [address] Phone:  [phone]  Fax:  [phone]     Pt would like a call back at [phone]

## 2020-04-05 NOTE — Telephone Encounter (Signed)
Ok to refill meds

## 2020-04-06 ENCOUNTER — Other Ambulatory Visit: Payer: Self-pay

## 2020-04-06 DIAGNOSIS — E1140 Type 2 diabetes mellitus with diabetic neuropathy, unspecified: Secondary | ICD-10-CM

## 2020-04-06 MED ORDER — LANTUS SOLOSTAR 100 UNIT/ML ~~LOC~~ SOPN: PEN_INJECTOR | SUBCUTANEOUS | 1 refills | Status: DC

## 2020-04-06 MED ORDER — BD PEN NEEDLE MICRO U/F 32G X 6 MM MISC: 3 refills | Status: DC

## 2020-04-06 MED ORDER — INSULIN ASPART 100 UNIT/ML FLEXPEN: 15.0000 [IU] | PEN_INJECTOR | Freq: Three times a day (TID) | SUBCUTANEOUS | 3 refills | Status: DC

## 2020-04-06 NOTE — Telephone Encounter (Signed)
Rx refilled.

## 2020-04-18 ENCOUNTER — Other Ambulatory Visit: Payer: Self-pay | Admitting: *Deleted

## 2020-04-18 DIAGNOSIS — Z794 Long term (current) use of insulin: Secondary | ICD-10-CM

## 2020-04-18 DIAGNOSIS — E1140 Type 2 diabetes mellitus with diabetic neuropathy, unspecified: Secondary | ICD-10-CM

## 2020-04-18 MED ORDER — LANTUS SOLOSTAR 100 UNIT/ML ~~LOC~~ SOPN: PEN_INJECTOR | SUBCUTANEOUS | 1 refills | Status: DC

## 2020-04-28 ENCOUNTER — Other Ambulatory Visit: Payer: Self-pay | Admitting: Family Medicine

## 2020-05-30 ENCOUNTER — Telehealth: Payer: Self-pay | Admitting: Family Medicine

## 2020-05-30 NOTE — Telephone Encounter (Signed)
Patient called wanting to talk to Dr. Salomon Fick or her medical assistant and I advised the patient that they are with patients and asked if I can sent a message back for her and she hung up.  Please advise

## 2020-06-04 NOTE — Telephone Encounter (Signed)
Spoke with pt updated pt COVID-19 vaccine, verbalized understanding

## 2020-06-12 ENCOUNTER — Other Ambulatory Visit: Payer: Self-pay | Admitting: Family Medicine

## 2020-07-03 ENCOUNTER — Other Ambulatory Visit: Payer: Self-pay | Admitting: Family Medicine

## 2020-07-03 DIAGNOSIS — Z794 Long term (current) use of insulin: Secondary | ICD-10-CM

## 2020-07-03 DIAGNOSIS — E1140 Type 2 diabetes mellitus with diabetic neuropathy, unspecified: Secondary | ICD-10-CM

## 2020-07-07 ENCOUNTER — Other Ambulatory Visit: Payer: Self-pay | Admitting: Family Medicine

## 2020-07-07 DIAGNOSIS — E1140 Type 2 diabetes mellitus with diabetic neuropathy, unspecified: Secondary | ICD-10-CM

## 2020-07-11 ENCOUNTER — Encounter: Payer: Self-pay | Admitting: Family Medicine

## 2020-07-11 ENCOUNTER — Other Ambulatory Visit: Payer: Self-pay

## 2020-07-11 ENCOUNTER — Ambulatory Visit (INDEPENDENT_AMBULATORY_CARE_PROVIDER_SITE_OTHER): Payer: Medicare Other | Admitting: Family Medicine

## 2020-07-11 VITALS — BP 146/86 | HR 82 | Temp 98.5°F | Wt 170.0 lb

## 2020-07-11 DIAGNOSIS — I10 Essential (primary) hypertension: Secondary | ICD-10-CM

## 2020-07-11 DIAGNOSIS — E1140 Type 2 diabetes mellitus with diabetic neuropathy, unspecified: Secondary | ICD-10-CM | POA: Diagnosis not present

## 2020-07-11 DIAGNOSIS — E782 Mixed hyperlipidemia: Secondary | ICD-10-CM

## 2020-07-11 DIAGNOSIS — Z23 Encounter for immunization: Secondary | ICD-10-CM | POA: Diagnosis not present

## 2020-07-11 DIAGNOSIS — Z794 Long term (current) use of insulin: Secondary | ICD-10-CM

## 2020-07-11 DIAGNOSIS — L299 Pruritus, unspecified: Secondary | ICD-10-CM | POA: Diagnosis not present

## 2020-07-11 MED ORDER — IVERMECTIN 3 MG PO TABS: 200.0000 ug/kg | ORAL_TABLET | Freq: Once | ORAL | 0 refills | Status: AC

## 2020-07-11 NOTE — Progress Notes (Signed)
Subjective:    Patient ID: Karen Powell, female    DOB: [birth year], 84 y.o.   MRN: [id number]  No chief complaint on file.   HPI Pt is an 84 yo female with pmh sig for migraines, HTN, GERD, DM 2 with neuropathy, trigger finger, urinary incontinence, HLD, palpitations who was seen for follow-up.  Pt has not been seen in person 2/2 COVID-19 pandemic.  Patient states she is doing well at home, staying in 2/2 COVID-19 pandemic.  Patient endorses pruritus and dark spot on upper extremities, back, and right leg from bug bites.  Patient states there are bugs crawling popcorn ceiling in her apartment.  Pt using rubbing alcohol on the areas.  Patient states her blood pressure is elevated 2/2 her family members causing increased stress.  Patient states she takes her medicine regularly.  Patient checking FSBS at home readings 57, 103, 168, 198, 227, 214, 299.    Pt inquires about a flu shot.    Past Medical History:  Diagnosis Date  . Arthritis   . Blood transfusion without reported diagnosis   . Depression   . Diabetic neuropathy ([place])   . Dizziness   . Frequent headaches   . Glaucoma   . Hyperlipidemia   . Hypertension   . Migraines   . Palpitations   . Urinary incontinence     Allergies  Allergen Reactions  . Penicillins Anaphylaxis    ROS General: Denies fever, chills, night sweats, changes in weight, changes in appetite HEENT: Denies headaches, ear pain, changes in vision, rhinorrhea, sore throat CV: Denies CP, palpitations, SOB, orthopnea Pulm: Denies SOB, cough, wheezing GI: Denies abdominal pain, nausea, vomiting, diarrhea, constipation GU: Denies dysuria, hematuria, frequency, vaginal discharge Msk: Denies muscle cramps, joint pains Neuro: Denies weakness, numbness, tingling Skin: Denies rashes, bruising  +dark spots on extremities, pruritus Psych: Denies depression, anxiety, hallucinations     Objective:    Blood pressure (!) 160/94, pulse 82, temperature 98.5 F (36.9  C), temperature source Oral, weight 170 lb (77.1 kg), SpO2 98 %.  Gen. Pleasant, well-nourished, in no distress, normal affect   HEENT: /AT, face symmetric, conjunctiva clear, no scleral icterus, PERRLA, EOMI, nares patent without drainage,  Lungs: no accessory muscle use, CTAB, no wheezes or rales Cardiovascular: RRR, no m/r/g, no peripheral edema Abdomen: BS present, soft, NT/ND Musculoskeletal: No deformities, no cyanosis or clubbing, normal tone Neuro:  A&Ox3, CN II-XII intact, normal gait Skin:  Warm, dry, intact. Pruritic hyperpigmented papules bilateral upper and lower extremities. A serpiginous linear erythematous lesion on RUE no lesions between webspaces of fingers. No lesions on back or trunk.  Wt Readings from Last 3 Encounters:  07/11/20 170 lb (77.1 kg)  01/03/20 166 lb 12.8 oz (75.7 kg)  11/25/18 170 lb (77.1 kg)    Lab Results  Component Value Date   HGBA1C 7.7 (A) 11/25/2018    Assessment/Plan:  Type 2 diabetes mellitus with diabetic neuropathy, with long-term current use of insulin (HCC) -Hemoglobin A1c 7.7% on 11/25/2018 -Given wide range of blood sugars per patient readings medication consistently or not eating diabetic diet. -Discussed for medication management blood pressure and diabetic monitoring -For now continue current medications including Lantus 65 units nightly 500 mg twice daily NovoLog 15 units 3 times daily with meals -We will obtain labs - Plan: Hemoglobin A1c, Lipid panel, ambulatory referral to [place] hypertension -Uncontrolled -Concern pt difficulty with medication. Discussed home health. -Patient encouraged to decrease sodium intake -Continue Toprol-XL 50 mg in  a.m. and 25 mg p.m. per cardiology -Continue follow-up with cardiology, Dr. Daneen Schick - Plan: CBC with Differential/Platelet, CMP with eGFR([facility]), CMP with eGFR([facility]), CBC with Differential/Platelet, Ambulatory referral to Home Health  Pruritus -Concern lesions  2/2 scabies -We will treat with ivermectin. Did not want to confuse patient with new medications, but will likely need second dose. -We will have patient follow-up in 1-2 weeks. Can prescribe second dose at that time. - Plan: TSH, T4, free, T4, free, TSH, ivermectin (STROMECTOL) 3 MG TABS tablet  Mixed hyperlipidemia -Continue lifestyle modifications -Continue Crestor 20 mg daily and aspirin 81 mg - Plan: Lipid pane  Need for immunization against influenza -  Plan: Flu Vaccine QUAD High Dose(Fluad)  F/u in 1-2 weeks. Reassess pruritus and chronic conditions. Will also assess memory.  Grier Mitts, MD

## 2020-07-11 NOTE — Patient Instructions (Addendum)
A prescription for ketorolac 10 was sent to your pharmacy to help with your itching.  You are to take the 5 pills at once.  You will likely need to repeat this dose in 7 to 14 days.  We can send in a new prescription for you.  We will see you for follow-up in 2 to 4 weeks.  Pruritus Pruritus is an itchy feeling on the skin. One of the most common causes is dry skin, but many different things can cause itching. Most cases of itching do not require medical attention. Sometimes itchy skin can turn into a rash. Follow these instructions at home: Skin care   Apply moisturizing lotion to your skin as needed. Lotion that contains petroleum jelly is best.  Take medicines or apply medicated creams only as told by your health care provider. This may include: ? Corticosteroid cream. ? Anti-itch lotions. ? Oral antihistamines.  Apply a cool, wet cloth (cool compress) to the affected areas.  Take baths with one of the following: ? Epsom salts. You can get these at your local pharmacy or grocery store. Follow the instructions on the packaging. ? Baking soda. Pour a small amount into the bath as told by your health care provider. ? Colloidal oatmeal. You can get this at your local pharmacy or grocery store. Follow the instructions on the packaging.  Apply baking soda paste to your skin. To make the paste, stir water into a small amount of baking soda until it reaches a paste-like consistency.  Do not scratch your skin.  Do not take hot showers or baths, which can make itching worse. A cool shower may help with itching as long as you apply moisturizing lotion after the shower.  Do not use scented soaps, detergents, perfumes, and cosmetic products. Instead, use gentle, unscented versions of these items. General instructions  Avoid wearing tight clothes.  Keep a journal to help find out what is causing your itching. Write down: ? What you eat and drink. ? What cosmetic products you use. ? What  soaps or detergents you use. ? What you wear, including jewelry.  Use a humidifier. This keeps the air moist, which helps to prevent dry skin.  Be aware of any changes in your itchiness. Contact a health care provider if:  The itching does not go away after several days.  You are unusually thirsty or urinating more than normal.  Your skin tingles or feels numb.  Your skin or the white parts of your eyes turn yellow (jaundice).  You feel weak.  You have any of the following: ? Night sweats. ? Tiredness (fatigue). ? Weight loss. ? Abdominal pain. Summary  Pruritus is an itchy feeling on the skin. One of the most common causes is dry skin, but many different conditions and factors can cause itching.  Apply moisturizing lotion to your skin as needed. Lotion that contains petroleum jelly is best.  Take medicines or apply medicated creams only as told by your health care provider.  Do not take hot showers or baths. Do not use scented soaps, detergents, perfumes, or cosmetic products. This information is not intended to replace advice given to you by your health care provider. Make sure you discuss any questions you have with your health care provider. Document Revised: 09/22/2017 Document Reviewed: 09/22/2017 Elsevier Patient Education  2020 [employer].  Managing Your Hypertension Hypertension is commonly called high blood pressure. This is when the force of your blood pressing against the walls of your arteries  is too strong. Arteries are blood vessels that carry blood from your heart throughout your body. Hypertension forces the heart to work harder to pump blood, and may cause the arteries to become narrow or stiff. Having untreated or uncontrolled hypertension can cause heart attack, stroke, kidney disease, and other problems. What are blood pressure readings? A blood pressure reading consists of a higher number over a lower number. Ideally, your blood pressure should be below  120/80. The first ("top") number is called the systolic pressure. It is a measure of the pressure in your arteries as your heart beats. The second ("bottom") number is called the diastolic pressure. It is a measure of the pressure in your arteries as the heart relaxes. What does my blood pressure reading mean? Blood pressure is classified into four stages. Based on your blood pressure reading, your health care provider may use the following stages to determine what type of treatment you need, if any. Systolic pressure and diastolic pressure are measured in a unit called mm Hg. Normal  Systolic pressure: below 120.  Diastolic pressure: below 80. Elevated  Systolic pressure: 120-129.  Diastolic pressure: below 80. Hypertension stage 1  Systolic pressure: 130-139.  Diastolic pressure: 80-89. Hypertension stage 2  Systolic pressure: 140 or above.  Diastolic pressure: 90 or above. What health risks are associated with hypertension? Managing your hypertension is an important responsibility. Uncontrolled hypertension can lead to:  A heart attack.  A stroke.  A weakened blood vessel (aneurysm).  Heart failure.  Kidney damage.  Eye damage.  Metabolic syndrome.  Memory and concentration problems. What changes can I make to manage my hypertension? Hypertension can be managed by making lifestyle changes and possibly by taking medicines. Your health care provider will help you make a plan to bring your blood pressure within a normal range. Eating and drinking   Eat a diet that is high in fiber and potassium, and low in salt (sodium), added sugar, and fat. An example eating plan is called the DASH (Dietary Approaches to Stop Hypertension) diet. To eat this way: ? Eat plenty of fresh fruits and vegetables. Try to fill half of your plate at each meal with fruits and vegetables. ? Eat whole grains, such as whole wheat pasta, brown rice, or whole grain bread. Fill about one quarter of  your plate with whole grains. ? Eat low-fat diary products. ? Avoid fatty cuts of meat, processed or cured meats, and poultry with skin. Fill about one quarter of your plate with lean proteins such as fish, chicken without skin, beans, eggs, and tofu. ? Avoid premade and processed foods. These tend to be higher in sodium, added sugar, and fat.  Reduce your daily sodium intake. Most people with hypertension should eat less than 1,500 mg of sodium a day.  Limit alcohol intake to no more than 1 drink a day for nonpregnant women and 2 drinks a day for men. One drink equals 12 oz of beer, 5 oz of wine, or 1 oz of hard liquor. Lifestyle  Work with your health care provider to maintain a healthy body weight, or to lose weight. Ask what an ideal weight is for you.  Get at least 30 minutes of exercise that causes your heart to beat faster (aerobic exercise) most days of the week. Activities may include walking, swimming, or biking.  Include exercise to strengthen your muscles (resistance exercise), such as weight lifting, as part of your weekly exercise routine. Try to do these types of exercises  for 30 minutes at least 3 days a week.  Do not use any products that contain nicotine or tobacco, such as cigarettes and e-cigarettes. If you need help quitting, ask your health care provider.  Control any long-term (chronic) conditions you have, such as high cholesterol or diabetes. Monitoring  Monitor your blood pressure at home as told by your health care provider. Your personal target blood pressure may vary depending on your medical conditions, your age, and other factors.  Have your blood pressure checked regularly, as often as told by your health care provider. Working with your health care provider  Review all the medicines you take with your health care provider because there may be side effects or interactions.  Talk with your health care provider about your diet, exercise habits, and other  lifestyle factors that may be contributing to hypertension.  Visit your health care provider regularly. Your health care provider can help you create and adjust your plan for managing hypertension. Will I need medicine to control my blood pressure? Your health care provider may prescribe medicine if lifestyle changes are not enough to get your blood pressure under control, and if:  Your systolic blood pressure is 130 or higher.  Your diastolic blood pressure is 80 or higher. Take medicines only as told by your health care provider. Follow the directions carefully. Blood pressure medicines must be taken as prescribed. The medicine does not work as well when you skip doses. Skipping doses also puts you at risk for problems. Contact a health care provider if:  You think you are having a reaction to medicines you have taken.  You have repeated (recurrent) headaches.  You feel dizzy.  You have swelling in your ankles.  You have trouble with your vision. Get help right away if:  You develop a severe headache or confusion.  You have unusual weakness or numbness, or you feel faint.  You have severe pain in your chest or abdomen.  You vomit repeatedly.  You have trouble breathing. Summary  Hypertension is when the force of blood pumping through your arteries is too strong. If this condition is not controlled, it may put you at risk for serious complications.  Your personal target blood pressure may vary depending on your medical conditions, your age, and other factors. For most people, a normal blood pressure is less than 120/80.  Hypertension is managed by lifestyle changes, medicines, or both. Lifestyle changes include weight loss, eating a healthy, low-sodium diet, exercising more, and limiting alcohol. This information is not intended to replace advice given to you by your health care provider. Make sure you discuss any questions you have with your health care provider. Document  Revised: 12/31/2018 Document Reviewed: 08/06/2016 Elsevier Patient Education  2020 [employer] Inc[employer]  High Cholesterol  High cholesterol is a condition in which the blood has high levels of a white, waxy, fat-like substance (cholesterol). The human body needs small amounts of cholesterol. The liver makes all the cholesterol that the body needs. Extra (excess) cholesterol comes from the food that we eat. Cholesterol is carried from the liver by the blood through the blood vessels. If you have high cholesterol, deposits (plaques) may build up on the walls of your blood vessels (arteries). Plaques make the arteries narrower and stiffer. Cholesterol plaques increase your risk for heart attack and stroke. Work with your health care provider to keep your cholesterol levels in a healthy range. What increases the risk? This condition is more likely to develop in people  who:  Eat foods that are high in animal fat (saturated fat) or cholesterol.  Are overweight.  Are not getting enough exercise.  Have a family history of high cholesterol. What are the signs or symptoms? There are no symptoms of this condition. How is this diagnosed? This condition may be diagnosed from the results of a blood test.  If you are older than age 60, your health care provider may check your cholesterol every 4-6 years.  You may be checked more often if you already have high cholesterol or other risk factors for heart disease. The blood test for cholesterol measures:  "Bad" cholesterol (LDL cholesterol). This is the main type of cholesterol that causes heart disease. The desired level for LDL is less than 100.  "Good" cholesterol (HDL cholesterol). This type helps to protect against heart disease by cleaning the arteries and carrying the LDL away. The desired level for HDL is 60 or higher.  Triglycerides. These are fats that the body can store or burn for energy. The desired number for triglycerides is lower than  150.  Total cholesterol. This is a measure of the total amount of cholesterol in your blood, including LDL cholesterol, HDL cholesterol, and triglycerides. A healthy number is less than 200. How is this treated? This condition is treated with diet changes, lifestyle changes, and medicines. Diet changes  This may include eating more whole grains, fruits, vegetables, nuts, and fish.  This may also include cutting back on red meat and foods that have a lot of added sugar. Lifestyle changes  Changes may include getting at least 40 minutes of aerobic exercise 3 times a week. Aerobic exercises include walking, biking, and swimming. Aerobic exercise along with a healthy diet can help you maintain a healthy weight.  Changes may also include quitting smoking. Medicines  Medicines are usually given if diet and lifestyle changes have failed to reduce your cholesterol to healthy levels.  Your health care provider may prescribe a statin medicine. Statin medicines have been shown to reduce cholesterol, which can reduce the risk of heart disease. Follow these instructions at home: Eating and drinking If told by your health care provider:  Eat chicken (without skin), fish, veal, shellfish, ground [place] breast, and round or loin cuts of red meat.  Do not eat fried foods or fatty meats, such as hot dogs and salami.  Eat plenty of fruits, such as apples.  Eat plenty of vegetables, such as broccoli, potatoes, and carrots.  Eat beans, peas, and lentils.  Eat grains such as barley, rice, couscous, and bulgur wheat.  Eat pasta without cream sauces.  Use skim or nonfat milk, and eat low-fat or nonfat yogurt and cheeses.  Do not eat or drink whole milk, cream, ice cream, egg yolks, or hard cheeses.  Do not eat stick margarine or tub margarines that contain trans fats (also called partially hydrogenated oils).  Do not eat saturated tropical oils, such as coconut oil and palm oil.  Do not eat  cakes, cookies, crackers, or other baked goods that contain trans fats.  General instructions  Exercise as directed by your health care provider. Increase your activity level with activities such as gardening, walking, and taking the stairs.  Take over-the-counter and prescription medicines only as told by your health care provider.  Do not use any products that contain nicotine or tobacco, such as cigarettes and e-cigarettes. If you need help quitting, ask your health care provider.  Keep all follow-up visits as told by your  health care provider. This is important. Contact a health care provider if:  You are struggling to maintain a healthy diet or weight.  You need help to start on an exercise program.  You need help to stop smoking. Get help right away if:  You have chest pain.  You have trouble breathing. This information is not intended to replace advice given to you by your health care provider. Make sure you discuss any questions you have with your health care provider. Document Revised: 09/11/2017 Document Reviewed: 03/08/2016 Elsevier Patient Education  2020 [employer]  Diabetes Mellitus and Nutrition, Adult When you have diabetes (diabetes mellitus), it is very important to have healthy eating habits because your blood sugar (glucose) levels are greatly affected by what you eat and drink. Eating healthy foods in the appropriate amounts, at about the same times every day, can help you:  Control your blood glucose.  Lower your risk of heart disease.  Improve your blood pressure.  Reach or maintain a healthy weight. Every person with diabetes is different, and each person has different needs for a meal plan. Your health care provider may recommend that you work with a diet and nutrition specialist (dietitian) to make a meal plan that is best for you. Your meal plan may vary depending on factors such as:  The calories you need.  The medicines you take.  Your  weight.  Your blood glucose, blood pressure, and cholesterol levels.  Your activity level.  Other health conditions you have, such as heart or kidney disease. How do carbohydrates affect me? Carbohydrates, also called carbs, affect your blood glucose level more than any other type of food. Eating carbs naturally raises the amount of glucose in your blood. Carb counting is a method for keeping track of how many carbs you eat. Counting carbs is important to keep your blood glucose at a healthy level, especially if you use insulin or take certain oral diabetes medicines. It is important to know how many carbs you can safely have in each meal. This is different for every person. Your dietitian can help you calculate how many carbs you should have at each meal and for each snack. Foods that contain carbs include:  Bread, cereal, rice, pasta, and crackers.  Potatoes and corn.  Peas, beans, and lentils.  Milk and yogurt.  Fruit and juice.  Desserts, such as cakes, cookies, ice cream, and candy. How does alcohol affect me? Alcohol can cause a sudden decrease in blood glucose (hypoglycemia), especially if you use insulin or take certain oral diabetes medicines. Hypoglycemia can be a life-threatening condition. Symptoms of hypoglycemia (sleepiness, dizziness, and confusion) are similar to symptoms of having too much alcohol. If your health care provider says that alcohol is safe for you, follow these guidelines:  Limit alcohol intake to no more than 1 drink per day for nonpregnant women and 2 drinks per day for men. One drink equals 12 oz of beer, 5 oz of wine, or 1 oz of hard liquor.  Do not drink on an empty stomach.  Keep yourself hydrated with water, diet soda, or unsweetened iced tea.  Keep in mind that regular soda, juice, and other mixers may contain a lot of sugar and must be counted as carbs. What are tips for following this plan?  Reading food labels  Start by checking the  serving size on the "Nutrition Facts" label of packaged foods and drinks. The amount of calories, carbs, fats, and other nutrients listed on the  label is based on one serving of the item. Many items contain more than one serving per package.  Check the total grams (g) of carbs in one serving. You can calculate the number of servings of carbs in one serving by dividing the total carbs by 15. For example, if a food has 30 g of total carbs, it would be equal to 2 servings of carbs.  Check the number of grams (g) of saturated and trans fats in one serving. Choose foods that have low or no amount of these fats.  Check the number of milligrams (mg) of salt (sodium) in one serving. Most people should limit total sodium intake to less than 2,300 mg per day.  Always check the nutrition information of foods labeled as "low-fat" or "nonfat". These foods may be higher in added sugar or refined carbs and should be avoided.  Talk to your dietitian to identify your daily goals for nutrients listed on the label. Shopping  Avoid buying canned, premade, or processed foods. These foods tend to be high in fat, sodium, and added sugar.  Shop around the outside edge of the grocery store. This includes fresh fruits and vegetables, bulk grains, fresh meats, and fresh dairy. Cooking  Use low-heat cooking methods, such as baking, instead of high-heat cooking methods like deep frying.  Cook using healthy oils, such as olive, canola, or sunflower oil.  Avoid cooking with butter, cream, or high-fat meats. Meal planning  Eat meals and snacks regularly, preferably at the same times every day. Avoid going long periods of time without eating.  Eat foods high in fiber, such as fresh fruits, vegetables, beans, and whole grains. Talk to your dietitian about how many servings of carbs you can eat at each meal.  Eat 4-6 ounces (oz) of lean protein each day, such as lean meat, chicken, fish, eggs, or tofu. One oz of lean  protein is equal to: ? 1 oz of meat, chicken, or fish. ? 1 egg. ?  cup of tofu.  Eat some foods each day that contain healthy fats, such as avocado, nuts, seeds, and fish. Lifestyle  Check your blood glucose regularly.  Exercise regularly as told by your health care provider. This may include: ? 150 minutes of moderate-intensity or vigorous-intensity exercise each week. This could be brisk walking, biking, or water aerobics. ? Stretching and doing strength exercises, such as yoga or weightlifting, at least 2 times a week.  Take medicines as told by your health care provider.  Do not use any products that contain nicotine or tobacco, such as cigarettes and e-cigarettes. If you need help quitting, ask your health care provider.  Work with a Veterinary surgeon or diabetes educator to identify strategies to manage stress and any emotional and social challenges. Questions to ask a health care provider  Do I need to meet with a diabetes educator?  Do I need to meet with a dietitian?  What number can I call if I have questions?  When are the best times to check my blood glucose? Where to find more information:  American Diabetes Association: diabetes.org  Academy of Nutrition and Dietetics: www.eatright.[employer] of Diabetes and Digestive and Kidney Diseases (NIH): CarFli[employer].tn Summary  A healthy meal plan will help you control your blood glucose and maintain a healthy lifestyle.  Working with a diet and nutrition specialist (dietitian) can help you make a meal plan that is best for you.  Keep in mind that carbohydrates (carbs) and alcohol have immediate  effects on your blood glucose levels. It is important to count carbs and to use alcohol carefully. This information is not intended to replace advice given to you by your health care provider. Make sure you discuss any questions you have with your health care provider. Document Revised: 08/21/2017 Document Reviewed:  10/13/2016 Elsevier Patient Education  2020 [employer].

## 2020-07-12 LAB — COMPLETE METABOLIC PANEL WITH GFR
AG Ratio: 1.6 (calc) (ref 1.0–2.5)
ALT: 17 U/L (ref 6–29)
AST: 21 U/L (ref 10–35)
Albumin: 4.3 g/dL (ref 3.6–5.1)
Alkaline phosphatase (APISO): 36 U/L — ABNORMAL LOW (ref 37–153)
BUN: 16 mg/dL (ref 7–25)
CO2: 30 mmol/L (ref 20–32)
Calcium: 10.6 mg/dL — ABNORMAL HIGH (ref 8.6–10.4)
Chloride: 101 mmol/L (ref 98–110)
Creat: 0.79 mg/dL (ref 0.60–0.88)
GFR, Est African American: 81 mL/min/{1.73_m2} (ref >=60)
GFR, Est Non African American: 70 mL/min/{1.73_m2} (ref >=60)
Globulin: 2.7 g/dL (calc) (ref 1.9–3.7)
Glucose, Bld: 175 mg/dL — ABNORMAL HIGH (ref 65–99)
Potassium: 4.4 mmol/L (ref 3.5–5.3)
Sodium: 137 mmol/L (ref 135–146)
Total Bilirubin: 0.9 mg/dL (ref 0.2–1.2)
Total Protein: 7 g/dL (ref 6.1–8.1)

## 2020-07-12 LAB — CBC WITH DIFFERENTIAL/PLATELET
Absolute Monocytes: 523 cells/uL (ref 200–950)
Basophils Absolute: 38 cells/uL (ref 0–200)
Basophils Relative: 0.6 %
Eosinophils Absolute: 132 cells/uL (ref 15–500)
Eosinophils Relative: 2.1 %
HCT: 38.2 % (ref 35.0–45.0)
Hemoglobin: 12.8 g/dL (ref 11.7–15.5)
Lymphs Abs: 1814 cells/uL (ref 850–3900)
MCH: 29.6 pg (ref 27.0–33.0)
MCHC: 33.5 g/dL (ref 32.0–36.0)
MCV: 88.4 fL (ref 80.0–100.0)
MPV: 12.3 fL (ref 7.5–12.5)
Monocytes Relative: 8.3 %
Neutro Abs: 3793 cells/uL (ref 1500–7800)
Neutrophils Relative %: 60.2 %
Platelets: 217 10*3/uL (ref 140–400)
RBC: 4.32 10*6/uL (ref 3.80–5.10)
RDW: 12.5 % (ref 11.0–15.0)
Total Lymphocyte: 28.8 %
WBC: 6.3 10*3/uL (ref 3.8–10.8)

## 2020-07-12 LAB — HEMOGLOBIN A1C
Hgb A1c MFr Bld: 9.2 % of total Hgb — ABNORMAL HIGH (ref <5.7)
Mean Plasma Glucose: 217 (calc)
eAG (mmol/L): 12 (calc)

## 2020-07-12 LAB — T4, FREE: Free T4: 1.1 ng/dL (ref 0.8–1.8)

## 2020-07-12 LAB — TSH: TSH: 1.05 mIU/L (ref 0.40–4.50)

## 2020-07-16 ENCOUNTER — Encounter: Payer: Self-pay | Admitting: Family Medicine

## 2020-07-16 ENCOUNTER — Other Ambulatory Visit: Payer: Self-pay | Admitting: Family Medicine

## 2020-07-19 ENCOUNTER — Telehealth: Payer: Self-pay

## 2020-07-19 DIAGNOSIS — Z7984 Long term (current) use of oral hypoglycemic drugs: Secondary | ICD-10-CM | POA: Diagnosis not present

## 2020-07-19 DIAGNOSIS — L299 Pruritus, unspecified: Secondary | ICD-10-CM | POA: Diagnosis not present

## 2020-07-19 DIAGNOSIS — M1990 Unspecified osteoarthritis, unspecified site: Secondary | ICD-10-CM | POA: Diagnosis not present

## 2020-07-19 DIAGNOSIS — I10 Essential (primary) hypertension: Secondary | ICD-10-CM | POA: Diagnosis not present

## 2020-07-19 DIAGNOSIS — F32A Depression, unspecified: Secondary | ICD-10-CM | POA: Diagnosis not present

## 2020-07-19 DIAGNOSIS — E1140 Type 2 diabetes mellitus with diabetic neuropathy, unspecified: Secondary | ICD-10-CM | POA: Diagnosis not present

## 2020-07-19 DIAGNOSIS — Z794 Long term (current) use of insulin: Secondary | ICD-10-CM | POA: Diagnosis not present

## 2020-07-19 DIAGNOSIS — G43909 Migraine, unspecified, not intractable, without status migrainosus: Secondary | ICD-10-CM | POA: Diagnosis not present

## 2020-07-24 ENCOUNTER — Other Ambulatory Visit: Payer: Self-pay

## 2020-07-24 ENCOUNTER — Ambulatory Visit (INDEPENDENT_AMBULATORY_CARE_PROVIDER_SITE_OTHER): Payer: Medicare Other

## 2020-07-24 DIAGNOSIS — Z0000 Encounter for general adult medical examination without abnormal findings: Secondary | ICD-10-CM

## 2020-07-24 NOTE — Patient Instructions (Signed)
Karen Powell , Thank you for taking time to come for your Medicare Wellness Visit. I appreciate your ongoing commitment to your health goals. Please review the following plan we discussed and let me know if I can assist you in the future.   Screening recommendations/referrals: Colonoscopy: No longer required  Mammogram: No longer required  Bone Density: No longer required Recommended yearly ophthalmology/optometry visit for glaucoma screening and checkup Recommended yearly dental visit for hygiene and checkup  Vaccinations: Influenza vaccine: Up to date, next due fall 2022  Pneumococcal vaccine: Currently due for Pneumovax 23, you may receive at your next office visit. Tdap vaccine: Currently due , you may receive at your next office or await and injury to receive   Shingles vaccine: Currently due for shingrix, you may receive at your local pharmacy.  Advanced directives: Advance directive discussed with you today. Even though you declined this today please call our office should you change your mind and we can give you the proper paperwork for you to fill out.   Conditions/risks identified: None   Next appointment: 07/25/2020 @    Preventive Care 82 Years and Older, Female Preventive care refers to lifestyle choices and visits with your health care provider that can promote health and wellness. What does preventive care include?  A yearly physical exam. This is also called an annual well check.  Dental exams once or twice a year.  Routine eye exams. Ask your health care provider how often you should have your eyes checked.  Personal lifestyle choices, including:  Daily care of your teeth and gums.  Regular physical activity.  Eating a healthy diet.  Avoiding tobacco and drug use.  Limiting alcohol use.  Practicing safe sex.  Taking low-dose aspirin every day.  Taking vitamin and mineral supplements as recommended by your health care provider. What happens during an  annual well check? The services and screenings done by your health care provider during your annual well check will depend on your age, overall health, lifestyle risk factors, and family history of disease. Counseling  Your health care provider may ask you questions about your:  Alcohol use.  Tobacco use.  Drug use.  Emotional well-being.  Home and relationship well-being.  Sexual activity.  Eating habits.  History of falls.  Memory and ability to understand (cognition).  Work and work Astronomer.  Reproductive health. Screening  You may have the following tests or measurements:  Height, weight, and BMI.  Blood pressure.  Lipid and cholesterol levels. These may be checked every 5 years, or more frequently if you are over 82 years old.  Skin check.  Lung cancer screening. You may have this screening every year starting at age 82 if you have a 30-pack-year history of smoking and currently smoke or have quit within the past 15 years.  Fecal occult blood test (FOBT) of the stool. You may have this test every year starting at age 82.  Flexible sigmoidoscopy or colonoscopy. You may have a sigmoidoscopy every 5 years or a colonoscopy every 10 years starting at age 82.  Hepatitis C blood test.  Hepatitis B blood test.  Sexually transmitted disease (STD) testing.  Diabetes screening. This is done by checking your blood sugar (glucose) after you have not eaten for a while (fasting). You may have this done every 1-3 years.  Bone density scan. This is done to screen for osteoporosis. You may have this done starting at age 82.  Mammogram. This may be done every 1-2 years.  Talk to your health care provider about how often you should have regular mammograms. Talk with your health care provider about your test results, treatment options, and if necessary, the need for more tests. Vaccines  Your health care provider may recommend certain vaccines, such as:  Influenza  vaccine. This is recommended every year.  Tetanus, diphtheria, and acellular pertussis (Tdap, Td) vaccine. You may need a Td booster every 10 years.  Zoster vaccine. You may need this after age 21.  Pneumococcal 13-valent conjugate (PCV13) vaccine. One dose is recommended after age 82.  Pneumococcal polysaccharide (PPSV23) vaccine. One dose is recommended after age 82. Talk to your health care provider about which screenings and vaccines you need and how often you need them. This information is not intended to replace advice given to you by your health care provider. Make sure you discuss any questions you have with your health care provider. Document Released: 10/05/2015 Document Revised: 05/28/2016 Document Reviewed: 07/10/2015 Elsevier Interactive Patient Education  2017 [place] Prevention in the Home Falls can cause injuries. They can happen to people of all ages. There are many things you can do to make your home safe and to help prevent falls. What can I do on the outside of my home?  Regularly fix the edges of walkways and driveways and fix any cracks.  Remove anything that might make you trip as you walk through a door, such as a raised step or threshold.  Trim any bushes or trees on the path to your home.  Use bright outdoor lighting.  Clear any walking paths of anything that might make someone trip, such as rocks or tools.  Regularly check to see if handrails are loose or broken. Make sure that both sides of any steps have handrails.  Any raised decks and porches should have guardrails on the edges.  Have any leaves, snow, or ice cleared regularly.  Use sand or salt on walking paths during winter.  Clean up any spills in your garage right away. This includes oil or grease spills. What can I do in the bathroom?  Use night lights.  Install grab bars by the toilet and in the tub and shower. Do not use towel bars as grab bars.  Use non-skid mats or decals  in the tub or shower.  If you need to sit down in the shower, use a plastic, non-slip stool.  Keep the floor dry. Clean up any water that spills on the floor as soon as it happens.  Remove soap buildup in the tub or shower regularly.  Attach bath mats securely with double-sided non-slip rug tape.  Do not have throw rugs and other things on the floor that can make you trip. What can I do in the bedroom?  Use night lights.  Make sure that you have a light by your bed that is easy to reach.  Do not use any sheets or blankets that are too big for your bed. They should not hang down onto the floor.  Have a firm chair that has side arms. You can use this for support while you get dressed.  Do not have throw rugs and other things on the floor that can make you trip. What can I do in the kitchen?  Clean up any spills right away.  Avoid walking on wet floors.  Keep items that you use a lot in easy-to-reach places.  If you need to reach something above you, use a strong step stool  that has a grab bar.  Keep electrical cords out of the way.  Do not use floor polish or wax that makes floors slippery. If you must use wax, use non-skid floor wax.  Do not have throw rugs and other things on the floor that can make you trip. What can I do with my stairs?  Do not leave any items on the stairs.  Make sure that there are handrails on both sides of the stairs and use them. Fix handrails that are broken or loose. Make sure that handrails are as long as the stairways.  Check any carpeting to make sure that it is firmly attached to the stairs. Fix any carpet that is loose or worn.  Avoid having throw rugs at the top or bottom of the stairs. If you do have throw rugs, attach them to the floor with carpet tape.  Make sure that you have a light switch at the top of the stairs and the bottom of the stairs. If you do not have them, ask someone to add them for you. What else can I do to help  prevent falls?  Wear shoes that:  Do not have high heels.  Have rubber bottoms.  Are comfortable and fit you well.  Are closed at the toe. Do not wear sandals.  If you use a stepladder:  Make sure that it is fully opened. Do not climb a closed stepladder.  Make sure that both sides of the stepladder are locked into place.  Ask someone to hold it for you, if possible.  Clearly mark and make sure that you can see:  Any grab bars or handrails.  First and last steps.  Where the edge of each step is.  Use tools that help you move around (mobility aids) if they are needed. These include:  Canes.  Walkers.  Scooters.  Crutches.  Turn on the lights when you go into a dark area. Replace any light bulbs as soon as they burn out.  Set up your furniture so you have a clear path. Avoid moving your furniture around.  If any of your floors are uneven, fix them.  If there are any pets around you, be aware of where they are.  Review your medicines with your doctor. Some medicines can make you feel dizzy. This can increase your chance of falling. Ask your doctor what other things that you can do to help prevent falls. This information is not intended to replace advice given to you by your health care provider. Make sure you discuss any questions you have with your health care provider. Document Released: 07/05/2009 Document Revised: 02/14/2016 Document Reviewed: 10/13/2014 Elsevier Interactive Patient Education  2017 [employer].

## 2020-07-24 NOTE — Progress Notes (Signed)
Subjective:   Karen Powell is a 84 y.o. female who presents for an Initial Medicare Annual Wellness Visit.  I connected with [name] today by telephone and verified that I am speaking with the correct person using two identifiers. Location patient: home Location provider: work Persons participating in the virtual visit: patient, provider.   I discussed the limitations, risks, security and privacy concerns of performing an evaluation and management service by telephone and the availability of in person appointments. I also discussed with the patient that there may be a patient responsible charge related to this service. The patient expressed understanding and verbally consented to this telephonic visit.    Interactive audio and video telecommunications were attempted between this provider and patient, however failed, due to patient having technical difficulties OR patient did not have access to video capability.  We continued and completed visit with audio only.      Review of Systems    N/A  Cardiac Risk Factors include: advanced age (>49men, >52 women);diabetes mellitus;hypertension;dyslipidemia     Objective:    Today's Vitals   07/24/20 1126  PainSc: 8    There is no height or weight on file to calculate BMI.  Advanced Directives 07/24/2020  Does Patient Have a Medical Advance Directive? No  Does patient want to make changes to medical advance directive? No - Patient declined    Current Medications (verified) Outpatient Encounter Medications as of 07/24/2020  Medication Sig  . Apoaequorin (PREVAGEN PO) Take 1 capsule by mouth daily.  Marland Kitchen aspirin EC 81 MG tablet Take 1 tablet (81 mg total) by mouth daily.  . BD PEN NEEDLE MICRO U/F 32G X 6 MM MISC USE TO ADMINISTER INSULIN THREE TIMES DAILY  . Cetirizine HCl (ZYRTEC ALLERGY PO) Take 1 tablet by mouth daily.  . Coenzyme Q10 (COQ10) 200 MG CAPS Take 1 capsule by mouth daily.  Marland Kitchen esomeprazole (NEXIUM) 20 MG capsule Take  20 mg by mouth daily at 12 noon.  . insulin glargine (LANTUS SOLOSTAR) 100 UNIT/ML Solostar Pen INJECT 55 UNITS INTO THE SKIN AT BEDTIME.  Marland Kitchen ivermectin (STROMECTOL) 3 MG TABS tablet SMARTSIG:5 Tablet(s) By Mouth Once  . losartan-hydrochlorothiazide (HYZAAR) 100-25 MG tablet TAKE 1 TABLET BY MOUTH EVERY DAY  . metFORMIN (GLUCOPHAGE) 500 MG tablet TAKE 1 TABLET BY MOUTH TWICE A DAY  . Multiple Vitamins-Minerals (CENTRUM PO) Take 1 tablet by mouth daily.  . Naproxen Sodium (ALEVE PO) Take 2 tablets by mouth daily as needed (pain).   . NOVOLOG FLEXPEN 100 UNIT/ML FlexPen INJECT 15 UNITS INTO THE SKIN 3 (THREE) TIMES DAILY WITH MEALS.  Marland Kitchen Omega-3 Fatty Acids (OMEGA 3 PO) Take 1 capsule by mouth daily.  Bertram Gala Glycol-Propyl Glycol (SYSTANE ULTRA OP) Apply 1 drop to eye daily.  . Probiotic Product (PROBIOTIC PO) Take 1 capsule by mouth at bedtime.  . rosuvastatin (CRESTOR) 20 MG tablet TAKE 1 TABLET BY MOUTH EVERYDAY AT BEDTIME  . Sennosides-Docusate Sodium (SENOKOT S PO) Take 1 tablet by mouth 2 (two) times daily.  . TOPROL XL 25 MG 24 hr tablet TAKE 2 TABS BY MOUTH EACH MORNING AND 1 TAB BY MOUTH EACH EVENING.  . Turmeric 500 MG TABS Take 1 tablet by mouth daily.   No facility-administered encounter medications on file as of 07/24/2020.    Allergies (verified) Penicillins   History: Past Medical History:  Diagnosis Date  . Arthritis   . Blood transfusion without reported diagnosis   . Depression   . Diabetic neuropathy (  HCC)   . Dizziness   . Frequent headaches   . Glaucoma   . Hyperlipidemia   . Hypertension   . Migraines   . Palpitations   . Urinary incontinence    Past Surgical History:  Procedure Laterality Date  . none     Family History  Problem Relation Age of Onset  . Diabetes Mother   . Hypertension Mother   . Diabetes Maternal Grandfather    Social History   Socioeconomic History  . Marital status: Single    Spouse name: Not on file  . Number of children:  4  . Years of education: 87  . Highest education level: Not on file  Occupational History  . Occupation: retired  Tobacco Use  . Smoking status: Never Smoker  . Smokeless tobacco: Never Used  Vaping Use  . Vaping Use: Never used  Substance and Sexual Activity  . Alcohol use: Yes  . Drug use: No  . Sexual activity: Not Currently  Other Topics Concern  . Not on file  Social History Narrative  . Not on file   Social Determinants of Health   Financial Resource Strain: Low Risk   . Difficulty of Paying Living Expenses: Not hard at all  Food Insecurity: No Food Insecurity  . Worried About Programme researcher, broadcasting/film/video in the Last Year: Never true  . Ran Out of Food in the Last Year: Never true  Transportation Needs: No Transportation Needs  . Lack of Transportation (Medical): No  . Lack of Transportation (Non-Medical): No  Physical Activity: Inactive  . Days of Exercise per Week: 0 days  . Minutes of Exercise per Session: 0 min  Stress: No Stress Concern Present  . Feeling of Stress : Not at all  Social Connections: Socially Isolated  . Frequency of Communication with Friends and Family: More than three times a week  . Frequency of Social Gatherings with Friends and Family: More than three times a week  . Attends Religious Services: Never  . Active Member of Clubs or Organizations: No  . Attends Banker Meetings: Never  . Marital Status: Widowed    Tobacco Counseling Counseling given: Not Answered   Clinical Intake:  Pre-visit preparation completed: Yes  Pain : 0-10 Pain Score: 8  Pain Type: Chronic pain Pain Location: Leg Pain Orientation:  (bilateral) Pain Descriptors / Indicators: Throbbing Pain Onset: More than a month ago Pain Frequency: Intermittent Pain Relieving Factors: Aleve  Pain Relieving Factors: Aleve  Nutritional Risks: None Diabetes: Yes CBG done?: No (this morning glucose was 168) Did pt. bring in CBG monitor from home?: No  How often  do you need to have someone help you when you read instructions, pamphlets, or other written materials from your doctor or pharmacy?: 1 - Never What is the last grade level you completed in school?: 12th Grade  Diabetic?Yes Nutrition Risk Assessment:  Has the patient had any N/V/D within the last 2 months?  No  Does the patient have any non-healing wounds?  No  Has the patient had any unintentional weight loss or weight gain?  No   Diabetes:  Is the patient diabetic?  Yes  If diabetic, was a CBG obtained today?  No  Did the patient bring in their glucometer from home?  No  How often do you monitor your CBG's? Patient states checks blood checks blood sugars 4 times per   Financial Strains and Diabetes Management:  Are you having any financial strains with the  device, your supplies or your medication? Yes .  Does the patient want to be seen by Chronic Care Management for management of their diabetes?  Yes  Would the patient like to be referred to a Nutritionist or for Diabetic Management?  No   Diabetic Exams:  Diabetic Eye Exam: Overdue for diabetic eye exam. Pt has been advised about the importance in completing this exam. Patient advised to call and schedule an eye exam. Diabetic Foot Exam: Overdue, Pt has been advised about the importance in completing this exam. Pt is scheduled for diabetic foot exam on 07/25/2020.   Interpreter Needed?: No  Information entered by :: SCrews,LPN   Activities of Daily Living In your present state of health, do you have any difficulty performing the following activities: 07/24/2020  Hearing? Y  Comment Has hearing aids  Vision? N  Difficulty concentrating or making decisions? Y  Comment Patient has issues remembering short term things  Walking or climbing stairs? N  Dressing or bathing? N  Doing errands, shopping? N  Preparing Food and eating ? N  Using the Toilet? N  In the past six months, have you accidently leaked urine? Y  Comment  has occassional bladder leakage, wears depends  Do you have problems with loss of bowel control? N  Managing your Medications? N  Managing your Finances? N  Housekeeping or managing your Housekeeping? N  Some recent data might be hidden    Patient Care Team: Deeann Saint, MD as PCP - General (Family Medicine) Lyn Records, MD as PCP - Cardiology (Cardiology)  Indicate any recent Medical Services you may have received from other than [facility] providers in the past year (date may be approximate).     Assessment:   This is a routine wellness examination for [facility].  Hearing/Vision screen  Hearing Screening             Right ear:           Left ear:           Vision Screening Comments: Patient states has not had eye exam since moving to [place]   Dietary issues and exercise activities discussed: Current Exercise Habits: The patient does not participate in regular exercise at present  Goals    . Exercise 3x per week (30 min per time)    . Patient Stated     I would like to get back into church.      Depression Screen PHQ 2/9 Scores 07/24/2020  PHQ - 2 Score 1  PHQ- 9 Score 1    Fall Risk Fall Risk  07/24/2020  Falls in the past year? 0  Number falls in past yr: 0  Injury with Fall? 0  Risk for fall due to : No Fall Risks  Follow up Falls evaluation completed;Falls prevention discussed    Any stairs in or around the home? No  If so, are there any without handrails? No  Home free of loose throw rugs in walkways, pet beds, electrical cords, etc? Yes  Adequate lighting in your home to reduce risk of falls? Yes   ASSISTIVE DEVICES UTILIZED TO PREVENT FALLS:  Life alert? No  Use of a cane, walker or w/c? No  Grab bars in the bathroom? Yes  Shower chair or bench in shower? No  Elevated toilet seat or a handicapped toilet? No    Cognitive Function:    Patient declined cognitive screening this visit.      Immunizations Immunization History  Administered Date(s) Administered  . Fluad Quad(high Dose 65+) 07/11/2020  . Influenza,inj,Quad PF,6+ Mos 07/16/2017  . PFIZER SARS-COV-2 Vaccination 03/01/2020, 03/29/2020  . Pneumococcal Conjugate-13 02/23/2018    TDAP status: Due, Education has been provided regarding the importance of this vaccine. Advised may receive this vaccine at local pharmacy or Health Dept. Aware to provide a copy of the vaccination record if obtained from local pharmacy or Health Dept. Verbalized acceptance and understanding. Flu Vaccine status: Up to date Pneumococcal vaccine status: Declined,  Education has been provided regarding the importance of this vaccine but patient still declined. Advised may receive this vaccine at local pharmacy or Health Dept. Aware to provide a copy of the vaccination record if obtained from local pharmacy or Health Dept. Verbalized acceptance and understanding.  Covid-19 vaccine status: Completed vaccines  Qualifies for Shingles Vaccine? Yes   Zostavax completed No   Shingrix Completed?: No.    Education has been provided regarding the importance of this vaccine. Patient has been advised to call insurance company to determine out of pocket expense if they have not yet received this vaccine. Advised may also receive vaccine at local pharmacy or Health Dept. Verbalized acceptance and understanding.  Screening Tests Health Maintenance  Topic Date Due  . FOOT EXAM  Never done  . OPHTHALMOLOGY EXAM  Never done  . TETANUS/TDAP  Never done  . DEXA SCAN  Never done  . PNA vac Low Risk Adult (2 of 2 - PPSV23) 02/24/2019  . HEMOGLOBIN A1C  01/09/2021  . INFLUENZA VACCINE  Completed  . COVID-19 Vaccine  Completed    Health Maintenance  Health Maintenance Due  Topic Date Due  . FOOT EXAM  Never done  . OPHTHALMOLOGY EXAM  Never done  . TETANUS/TDAP  Never done  . DEXA SCAN  Never done  . PNA vac Low Risk Adult (2 of 2 - PPSV23) 02/24/2019     Colorectal cancer screening: No longer required.  Mammogram status: No longer required.  Bone Density: No longer required  Lung Cancer Screening: (Low Dose CT Chest recommended if Age 73-80 years, 30 pack-year currently smoking OR have quit w/in 15years.) does not qualify.   Lung Cancer Screening Referral: N/A   Additional Screening:  Hepatitis C Screening: does not qualify;   Vision Screening: Recommended annual ophthalmology exams for early detection of glaucoma and other disorders of the eye. Is the patient up to date with their annual eye exam?  No  Who is the provider or what is the name of the office in which the patient attends annual eye exams? Patient has not had her eyes checked since being in [place] If pt is not established with a provider, would they like to be referred to a provider to establish care? No .   Dental Screening: Recommended annual dental exams for proper oral hygiene  Community Resource Referral / Chronic Care Management: CRR required this visit?  No   CCM required this visit?  No      Plan:     I have personally reviewed and noted the following in the patient's chart:   . Medical and social history . Use of alcohol, tobacco or illicit drugs  . Current medications and supplements . Functional ability and status . Nutritional status . Physical activity . Advanced directives . List of other physicians . Hospitalizations, surgeries, and ER visits in previous 12 months . Vitals . Screenings to include cognitive, depression, and falls . Referrals and appointments  In addition,  I have reviewed and discussed with patient certain preventive protocols, quality metrics, and best practice recommendations. A written personalized care plan for preventive services as well as general preventive health recommendations were provided to patient.     Theodora BlowShannon Arav Bannister, LPN   16/1/096011/10/2019   Nurse Notes: None

## 2020-07-25 ENCOUNTER — Ambulatory Visit (INDEPENDENT_AMBULATORY_CARE_PROVIDER_SITE_OTHER): Payer: Medicare Other | Admitting: Family Medicine

## 2020-07-25 ENCOUNTER — Other Ambulatory Visit: Payer: Self-pay

## 2020-07-25 ENCOUNTER — Encounter: Payer: Self-pay | Admitting: Family Medicine

## 2020-07-25 VITALS — BP 162/78 | HR 71 | Temp 97.8°F | Wt 168.0 lb

## 2020-07-25 DIAGNOSIS — Z794 Long term (current) use of insulin: Secondary | ICD-10-CM | POA: Diagnosis not present

## 2020-07-25 DIAGNOSIS — I10 Essential (primary) hypertension: Secondary | ICD-10-CM | POA: Diagnosis not present

## 2020-07-25 DIAGNOSIS — F0390 Unspecified dementia without behavioral disturbance: Secondary | ICD-10-CM | POA: Diagnosis not present

## 2020-07-25 DIAGNOSIS — E1140 Type 2 diabetes mellitus with diabetic neuropathy, unspecified: Secondary | ICD-10-CM

## 2020-07-25 NOTE — Patient Instructions (Addendum)
At your next appointment in 1 month, have your family member come to the visit with you.  Dementia Dementia is a condition that affects the way the brain works. It often affects memory and thinking. There are many types of dementia. Some types get worse with time and cannot be reversed. Some types of dementia include:  Alzheimer's disease. This is the most common type.  Vascular dementia. This type may happen due to a stroke.  Lewy body dementia. This type may happen to people who have Parkinson's disease.  Frontotemporal dementia. This type is caused by damage to nerve cells in certain parts of the brain. Some people may have more than one type, and this is called mixed dementia. What are the causes? This condition is caused by damage to cells in the brain. Some causes that cannot be reversed include:  Having a condition that affects the blood vessels of the brain, such as diabetes, heart disease, or blood vessel disease.  Changes to genes. Some causes that can be reversed or slowed include:  Injury to the brain.  Certain medicines.  Infection.  Not having enough vitamin B12 in the body, or thyroid problems.  A tumor or blood clot in the brain. What are the signs or symptoms? Symptoms depend on the type of dementia. This may include:  Problems remembering things.  Having trouble taking a bath or putting clothes on.  Forgetting appointments.  Forgetting to pay bills.  Trouble planning and making meals.  Having trouble speaking.  Getting lost easily. How is this treated? Treatment depends on the cause of the dementia. It might include taking medicines that help:  To control the dementia.  To slow down the dementia.  To manage symptoms. In some cases, treating the cause of your dementia can improve symptoms, reverse symptoms, or slow down how quickly it gets worse. Your doctor can help you find support groups and other doctors who can help with your care. Follow  these instructions at home: Medicines  Take over-the-counter and prescription medicines only as told by your doctor.  Use a pill organizer to help you manage your medicines.  Avoidtaking medicines for pain or for sleep. Lifestyle  Make healthy choices: ? Be active as told by your doctor. ? Do not use any products that contain nicotine or tobacco, such as cigarettes, e-cigarettes, and chewing tobacco. If you need help quitting, ask your doctor. ? Do not drink alcohol. ? When you get stressed, do something that will help you to relax. Your doctor can give you tips. ? Spend time with other people.  Make sure you get good sleep. To get good sleep: ? Try not to take naps during the day. ? Keep your bedroom dark and cool. ? In the few hours before you go to bed, try not to do any exercise. ? Do not have foods and drinks with caffeine at night. Eating and drinking  Drink enough fluid to keep your pee (urine) pale yellow.  Eat a healthy diet. General instructions   Talk with your doctor to figure out: ? What you need help with. ? What your safety needs are.  Ask your doctor if it is safe for you to drive.  If told, wear a bracelet that tracks where you are or shows that you are a person with memory loss.  Work with your family to make big decisions.  Keep all follow-up visits as told by your doctor. This is important. Contact a doctor if:  You have  any new symptoms.  Your symptoms get worse.  You have problems with swallowing or choking. Get help right away if:  You feel very sad, or feel that you want to harm yourself.  You or your family members are worried for your safety. If you ever feel like you may hurt yourself or others, or have thoughts about taking your own life, get help right away. You can go to your nearest emergency department or call:  Your local emergency services (911 in the [place]  A suicide crisis helpline, such as the National Suicide Prevention  Lifeline at 682 443 6501. This is open 24 hours a day. Summary  Dementia often affects memory and thinking.  Some types of dementia get worse with time and cannot be reversed.  Treatment for this condition depends on the cause.  Talk with your doctor to figure out what you need help with.  Your doctor can help you find support groups and other doctors who can help with your care. This information is not intended to replace advice given to you by your health care provider. Make sure you discuss any questions you have with your health care provider. Document Revised: 11/23/2018 Document Reviewed: 11/23/2018 Elsevier Patient Education  2020 [employer].  Managing Your Hypertension Hypertension is commonly called high blood pressure. This is when the force of your blood pressing against the walls of your arteries is too strong. Arteries are blood vessels that carry blood from your heart throughout your body. Hypertension forces the heart to work harder to pump blood, and may cause the arteries to become narrow or stiff. Having untreated or uncontrolled hypertension can cause heart attack, stroke, kidney disease, and other problems. What are blood pressure readings? A blood pressure reading consists of a higher number over a lower number. Ideally, your blood pressure should be below 120/80. The first ("top") number is called the systolic pressure. It is a measure of the pressure in your arteries as your heart beats. The second ("bottom") number is called the diastolic pressure. It is a measure of the pressure in your arteries as the heart relaxes. What does my blood pressure reading mean? Blood pressure is classified into four stages. Based on your blood pressure reading, your health care provider may use the following stages to determine what type of treatment you need, if any. Systolic pressure and diastolic pressure are measured in a unit called mm Hg. Normal  Systolic pressure: below  120.  Diastolic pressure: below 80. Elevated  Systolic pressure: 120-129.  Diastolic pressure: below 80. Hypertension stage 1  Systolic pressure: 130-139.  Diastolic pressure: 80-89. Hypertension stage 2  Systolic pressure: 140 or above.  Diastolic pressure: 90 or above. What health risks are associated with hypertension? Managing your hypertension is an important responsibility. Uncontrolled hypertension can lead to:  A heart attack.  A stroke.  A weakened blood vessel (aneurysm).  Heart failure.  Kidney damage.  Eye damage.  Metabolic syndrome.  Memory and concentration problems. What changes can I make to manage my hypertension? Hypertension can be managed by making lifestyle changes and possibly by taking medicines. Your health care provider will help you make a plan to bring your blood pressure within a normal range. Eating and drinking   Eat a diet that is high in fiber and potassium, and low in salt (sodium), added sugar, and fat. An example eating plan is called the DASH (Dietary Approaches to Stop Hypertension) diet. To eat this way: ? Eat plenty of fresh fruits and  vegetables. Try to fill half of your plate at each meal with fruits and vegetables. ? Eat whole grains, such as whole wheat pasta, brown rice, or whole grain bread. Fill about one quarter of your plate with whole grains. ? Eat low-fat diary products. ? Avoid fatty cuts of meat, processed or cured meats, and poultry with skin. Fill about one quarter of your plate with lean proteins such as fish, chicken without skin, beans, eggs, and tofu. ? Avoid premade and processed foods. These tend to be higher in sodium, added sugar, and fat.  Reduce your daily sodium intake. Most people with hypertension should eat less than 1,500 mg of sodium a day.  Limit alcohol intake to no more than 1 drink a day for nonpregnant women and 2 drinks a day for men. One drink equals 12 oz of beer, 5 oz of wine, or 1 oz of  hard liquor. Lifestyle  Work with your health care provider to maintain a healthy body weight, or to lose weight. Ask what an ideal weight is for you.  Get at least 30 minutes of exercise that causes your heart to beat faster (aerobic exercise) most days of the week. Activities may include walking, swimming, or biking.  Include exercise to strengthen your muscles (resistance exercise), such as weight lifting, as part of your weekly exercise routine. Try to do these types of exercises for 30 minutes at least 3 days a week.  Do not use any products that contain nicotine or tobacco, such as cigarettes and e-cigarettes. If you need help quitting, ask your health care provider.  Control any long-term (chronic) conditions you have, such as high cholesterol or diabetes. Monitoring  Monitor your blood pressure at home as told by your health care provider. Your personal target blood pressure may vary depending on your medical conditions, your age, and other factors.  Have your blood pressure checked regularly, as often as told by your health care provider. Working with your health care provider  Review all the medicines you take with your health care provider because there may be side effects or interactions.  Talk with your health care provider about your diet, exercise habits, and other lifestyle factors that may be contributing to hypertension.  Visit your health care provider regularly. Your health care provider can help you create and adjust your plan for managing hypertension. Will I need medicine to control my blood pressure? Your health care provider may prescribe medicine if lifestyle changes are not enough to get your blood pressure under control, and if:  Your systolic blood pressure is 130 or higher.  Your diastolic blood pressure is 80 or higher. Take medicines only as told by your health care provider. Follow the directions carefully. Blood pressure medicines must be taken as  prescribed. The medicine does not work as well when you skip doses. Skipping doses also puts you at risk for problems. Contact a health care provider if:  You think you are having a reaction to medicines you have taken.  You have repeated (recurrent) headaches.  You feel dizzy.  You have swelling in your ankles.  You have trouble with your vision. Get help right away if:  You develop a severe headache or confusion.  You have unusual weakness or numbness, or you feel faint.  You have severe pain in your chest or abdomen.  You vomit repeatedly.  You have trouble breathing. Summary  Hypertension is when the force of blood pumping through your arteries is too strong. If  this condition is not controlled, it may put you at risk for serious complications.  Your personal target blood pressure may vary depending on your medical conditions, your age, and other factors. For most people, a normal blood pressure is less than 120/80.  Hypertension is managed by lifestyle changes, medicines, or both. Lifestyle changes include weight loss, eating a healthy, low-sodium diet, exercising more, and limiting alcohol. This information is not intended to replace advice given to you by your health care provider. Make sure you discuss any questions you have with your health care provider. Document Revised: 12/31/2018 Document Reviewed: 08/06/2016 Elsevier Patient Education  2020 Elsevier Inc[employer]  Hyperglycemia Hyperglycemia is when the sugar (glucose) level in your blood is too high. It may not cause symptoms. If you do have symptoms, they may include warning signs, such as:  Feeling more thirsty than normal.  Hunger.  Feeling tired.  Needing to pee (urinate) more than normal.  Blurry eyesight (vision). You may get other symptoms as it gets worse, such as:  Dry mouth.  Not being hungry (loss of appetite).  Fruity-smelling breath.  Weakness.  Weight gain or loss that is not planned.  Weight loss may be fast.  A tingling or numb feeling in your hands or feet.  Headache.  Skin that does not bounce back quickly when it is lightly pinched and released (poor skin turgor).  Pain in your belly (abdomen).  Cuts or bruises that heal slowly. High blood sugar can happen to people who do or do not have diabetes. High blood sugar can happen slowly or quickly, and it can be an emergency. Follow these instructions at home: General instructions  Take over-the-counter and prescription medicines only as told by your doctor.  Do not use products that contain nicotine or tobacco, such as cigarettes and e-cigarettes. If you need help quitting, ask your doctor.  Limit alcohol intake to no more than 1 drink per day for nonpregnant women and 2 drinks per day for men. One drink equals 12 oz of beer, 5 oz of wine, or 1 oz of hard liquor.  Manage stress. If you need help with this, ask your doctor.  Keep all follow-up visits as told by your doctor. This is important. Eating and drinking   Stay at a healthy weight.  Exercise regularly, as told by your doctor.  Drink enough fluid, especially when you: ? Exercise. ? Get sick. ? Are in hot temperatures.  Eat healthy foods, such as: ? Low-fat (lean) proteins. ? Complex carbs (complex carbohydrates), such as whole wheat bread or brown rice. ? Fresh fruits and vegetables. ? Low-fat dairy products. ? Healthy fats.  Drink enough fluid to keep your pee (urine) clear or pale yellow. If you have diabetes:   Make sure you know the symptoms of hyperglycemia.  Follow your diabetes management plan, as told by your doctor. Make sure you: ? Take insulin and medicines as told. ? Follow your exercise plan. ? Follow your meal plan. Eat on time. Do not skip meals. ? Check your blood sugar as often as told. Make sure to check before and after exercise. If you exercise longer or in a different way than you normally do, check your blood sugar  more often. ? Follow your sick day plan whenever you cannot eat or drink normally. Make this plan ahead of time with your doctor.  Share your diabetes management plan with people in your workplace, school, and household.  Check your urine for ketones when you  are ill and as told by your doctor.  Carry a card or wear jewelry that says that you have diabetes. Contact a doctor if:  Your blood sugar level is higher than 240 mg/dL (86.7 mmol/L) for 2 days in a row.  You have problems keeping your blood sugar in your target range.  High blood sugar happens often for you. Get help right away if:  You have trouble breathing.  You have a change in how you think, feel, or act (mental status).  You feel sick to your stomach (nauseous), and that feeling does not go away.  You cannot stop throwing up (vomiting). These symptoms may be an emergency. Do not wait to see if the symptoms will go away. Get medical help right away. Call your local emergency services (911 in the [place] Do not drive yourself to the hospital. Summary  Hyperglycemia is when the sugar (glucose) level in your blood is too high.  High blood sugar can happen to people who do or do not have diabetes.  Make sure you drink enough fluids, eat healthy foods, and exercise regularly.  Contact your doctor if you have problems keeping your blood sugar in your target range. This information is not intended to replace advice given to you by your health care provider. Make sure you discuss any questions you have with your health care provider. Document Revised: 05/26/2016 Document Reviewed: 05/26/2016 Elsevier Patient Education  2020 [employer].

## 2020-07-25 NOTE — Progress Notes (Signed)
Subjective:    Patient ID: Karen Powell, female    DOB: Feb 21, 1936, 84 y.o.   MRN: [id number]  No chief complaint on file. Pt's daughter in the car.  HPI Patient was seen today for f/u.  Pt notes elevation in blood sugar, highest 300.  Pt eating soup, collards, hamhocks, and neck bones.   Pt drinking a capful of Bacardi 151 before bed.  States taking all meds.  Pt notes HH made an initial visit.  Pt endorses difficulty with memory.  Tried Prevagen xtra strength.  Pt lives in an apt alone.    Past Medical History:  Diagnosis Date  . Arthritis   . Blood transfusion without reported diagnosis   . Depression   . Diabetic neuropathy (HCC)   . Dizziness   . Frequent headaches   . Glaucoma   . Hyperlipidemia   . Hypertension   . Migraines   . Palpitations   . Urinary incontinence     Allergies  Allergen Reactions  . Penicillins Anaphylaxis    ROS General: Denies fever, chills, night sweats, changes in weight, changes in appetite  +memory concerns.   HEENT: Denies headaches, ear pain, changes in vision, rhinorrhea, sore throat CV: Denies CP, palpitations, SOB, orthopnea Pulm: Denies SOB, cough, wheezing GI: Denies abdominal pain, nausea, vomiting, diarrhea, constipation GU: Denies dysuria, hematuria, frequency, vaginal discharge Msk: Denies muscle cramps, joint pains Neuro: Denies weakness, numbness, tingling Skin: Denies rashes, bruising Psych: Denies depression, anxiety, hallucinations     Objective:    Blood pressure (!) 162/78, pulse 71, temperature 97.8 F (36.6 C), temperature source Oral, weight 168 lb (76.2 kg), SpO2 96 %.  Gen. Pleasant, well-nourished, in no distress, normal affect   HEENT: [place]/AT, face symmetric, conjunctiva clear, no scleral icterus, PERRLA, EOMI, nares patent without drainage Lungs: no accessory muscle use Cardiovascular: RRR, no peripheral edema Musculoskeletal: No deformities, no cyanosis or clubbing, normal tone Neuro:  A&Ox3, CN II-XII  intact, normal gait Skin:  Warm, no lesions/ rash Psych: Normal affect, mood appropriate.  MOCA administered.  16/30 pts given including 1 pt for education.  Total from image below missing 2 points for contour and hands on clock.     Wt Readings from Last 3 Encounters:  07/25/20 168 lb (76.2 kg)  07/11/20 170 lb (77.1 kg)  01/03/20 166 lb 12.8 oz (75.7 kg)    Lab Results  Component Value Date   WBC 6.3 07/11/2020   HGB 12.8 07/11/2020   HCT 38.2 07/11/2020   PLT 217 07/11/2020   GLUCOSE 175 (H) 07/11/2020   ALT 17 07/11/2020   AST 21 07/11/2020   NA 137 07/11/2020   K 4.4 07/11/2020   CL 101 07/11/2020   CREATININE 0.79 07/11/2020   BUN 16 07/11/2020   CO2 30 07/11/2020   TSH 1.05 07/11/2020   HGBA1C 9.2 (H) 07/11/2020    Assessment/Plan:  Dementia without behavioral disturbance, unspecified dementia type (HCC)  -MMSE done this visit.  Score 16/30 including additional point for education as pt completed HS and 6 months of post HS education -discussed obtaining imaging and neuropsych testing -pt's daughter encouraged to come to next f/u appt - Plan: Ambulatory referral to Psychology, CT HEAD W & WO CONTRAST  Essential hypertension -uncontrolled -concern pt may be forgetting to take meds -HH to start assisting with meds -discussed lifestyle modifications -Continue losartan-hydrochlorothiazide 100-25 mg daily, Toprol-XL 50 mg in a.m. and 25 mg in p.m.  Type 2 diabetes mellitus with diabetic neuropathy, with long-term  current use of insulin (HCC) -hemoglobin A1C 9.2% on 07/11/20 -lifestyle modifications strongly encouraged -continue current medications including lantus 55 units qhs, NovoLog 15 units 3 times daily with meals, Metformin 500 mg twice daily -given given concerns for memory issues will wait to adjust medication until home health is established and visiting regularly.  F/u in 1 month  Abbe Amsterdam, MD

## 2020-08-01 ENCOUNTER — Encounter: Payer: Self-pay | Admitting: Family Medicine

## 2020-08-01 DIAGNOSIS — F0390 Unspecified dementia without behavioral disturbance: Secondary | ICD-10-CM | POA: Insufficient documentation

## 2020-08-01 DIAGNOSIS — F0280 Dementia in other diseases classified elsewhere without behavioral disturbance: Secondary | ICD-10-CM | POA: Insufficient documentation

## 2020-08-04 ENCOUNTER — Other Ambulatory Visit: Payer: Self-pay | Admitting: Family Medicine

## 2020-08-04 DIAGNOSIS — E1140 Type 2 diabetes mellitus with diabetic neuropathy, unspecified: Secondary | ICD-10-CM

## 2020-08-08 ENCOUNTER — Telehealth: Payer: Self-pay | Admitting: Family Medicine

## 2020-08-08 NOTE — Telephone Encounter (Signed)
Spoke with pt pharmacy state that they had enough refills for pt medications. Advise to call pt to let her know that they are ready for pick up

## 2020-08-08 NOTE — Telephone Encounter (Signed)
Pt call and stated she need a refill on losartan-hydrochlorothiazide (HYZAAR) 100-25 MG tablet and NOVOLOG FLEXPEN 100 UNIT/ML FlexPen sent to  [id number], [place] - [address] Phone:  [phone]  Fax:  [phone]

## 2020-08-18 DIAGNOSIS — G43909 Migraine, unspecified, not intractable, without status migrainosus: Secondary | ICD-10-CM | POA: Diagnosis not present

## 2020-08-18 DIAGNOSIS — E1140 Type 2 diabetes mellitus with diabetic neuropathy, unspecified: Secondary | ICD-10-CM | POA: Diagnosis not present

## 2020-08-18 DIAGNOSIS — Z794 Long term (current) use of insulin: Secondary | ICD-10-CM | POA: Diagnosis not present

## 2020-08-18 DIAGNOSIS — Z7984 Long term (current) use of oral hypoglycemic drugs: Secondary | ICD-10-CM | POA: Diagnosis not present

## 2020-08-18 DIAGNOSIS — I10 Essential (primary) hypertension: Secondary | ICD-10-CM | POA: Diagnosis not present

## 2020-08-18 DIAGNOSIS — M1990 Unspecified osteoarthritis, unspecified site: Secondary | ICD-10-CM | POA: Diagnosis not present

## 2020-08-18 DIAGNOSIS — L299 Pruritus, unspecified: Secondary | ICD-10-CM | POA: Diagnosis not present

## 2020-08-18 DIAGNOSIS — F32A Depression, unspecified: Secondary | ICD-10-CM | POA: Diagnosis not present

## 2020-08-20 ENCOUNTER — Other Ambulatory Visit: Payer: Self-pay | Admitting: Family Medicine

## 2020-08-20 ENCOUNTER — Other Ambulatory Visit: Payer: Self-pay

## 2020-08-20 DIAGNOSIS — E1140 Type 2 diabetes mellitus with diabetic neuropathy, unspecified: Secondary | ICD-10-CM

## 2020-08-20 DIAGNOSIS — Z794 Long term (current) use of insulin: Secondary | ICD-10-CM

## 2020-08-20 MED ORDER — NOVOLOG FLEXPEN 100 UNIT/ML ~~LOC~~ SOPN: PEN_INJECTOR | SUBCUTANEOUS | 3 refills | Status: DC

## 2020-08-20 MED ORDER — NOVOLOG FLEXPEN 100 UNIT/ML ~~LOC~~ SOPN: PEN_INJECTOR | SUBCUTANEOUS | 1 refills | Status: DC

## 2020-08-20 NOTE — Telephone Encounter (Signed)
Rx sent in

## 2020-08-20 NOTE — Addendum Note (Signed)
Addended by: Kathreen Devoid on: 08/20/2020 12:36 PM   Modules accepted: Orders

## 2020-08-20 NOTE — Telephone Encounter (Signed)
NOVOLOG FLEXPEN 100 UNIT/ML FlexPen  She needs 3 pens which is a 3 month supply  [id number], [place] - [address] Phone:  [phone]  Fax:  [phone]

## 2020-08-22 ENCOUNTER — Encounter: Payer: Self-pay | Admitting: Family Medicine

## 2020-08-22 ENCOUNTER — Other Ambulatory Visit: Payer: Self-pay

## 2020-08-22 ENCOUNTER — Ambulatory Visit (INDEPENDENT_AMBULATORY_CARE_PROVIDER_SITE_OTHER): Payer: Medicare Other | Admitting: Family Medicine

## 2020-08-22 VITALS — BP 148/88 | HR 80 | Temp 98.5°F | Wt 169.8 lb

## 2020-08-22 DIAGNOSIS — Z794 Long term (current) use of insulin: Secondary | ICD-10-CM

## 2020-08-22 DIAGNOSIS — F0390 Unspecified dementia without behavioral disturbance: Secondary | ICD-10-CM

## 2020-08-22 DIAGNOSIS — I10 Essential (primary) hypertension: Secondary | ICD-10-CM

## 2020-08-22 DIAGNOSIS — E1140 Type 2 diabetes mellitus with diabetic neuropathy, unspecified: Secondary | ICD-10-CM

## 2020-08-22 DIAGNOSIS — Z8659 Personal history of other mental and behavioral disorders: Secondary | ICD-10-CM | POA: Diagnosis not present

## 2020-08-22 LAB — POCT GLUCOSE (DEVICE FOR HOME USE): Glucose Fasting, POC: 271 mg/dL — AB (ref 70–99)

## 2020-08-22 LAB — POCT GLYCOSYLATED HEMOGLOBIN (HGB A1C): Hemoglobin A1C: 8.4 % — AB (ref 4.0–5.6)

## 2020-08-22 NOTE — Progress Notes (Signed)
Subjective:    Patient ID: Karen Powell, female    DOB: May 30, 1936, 84 y.o.   MRN: [id number]  No chief complaint on file. Patient accompanied by her daughter, [name]  HPI Patient is an 84 yo female with pmh sig for HTN, migraines, GERD, DM II with neuropathy, dementia, HLD, arthritis, h/o depression who was seen for f/u.  Pt states there are still bugs in her home that come out at night.  Pt's daughter denies this, states she and family members have stayed at her apt and have not had any bites or seen any bugs.  Pt's daughter inquires about why HH was started.  Endorses 2 HH visits.   Pt states HH was to call before coming by, but she does not answer calls of numbers she does not know.  Pt has not been taking Novolog as she could not get a 90 day supply from the pharmacy.   Separate discussion with daughter reveals family noticed memory changes x at least 2 yrs.  Daughter inquires if pt having skin issues 2/2 hyperglycemia.  Past Medical History:  Diagnosis Date  . Arthritis   . Blood transfusion without reported diagnosis   . Depression   . Diabetic neuropathy (HCC)   . Dizziness   . Frequent headaches   . Glaucoma   . Hyperlipidemia   . Hypertension   . Migraines   . Palpitations   . Urinary incontinence     Allergies  Allergen Reactions  . Penicillins Anaphylaxis    ROS General: Denies fever, chills, night sweats, changes in weight, changes in appetite  +memory chanages HEENT: Denies headaches, ear pain, changes in vision, rhinorrhea, sore throat CV: Denies CP, palpitations, SOB, orthopnea Pulm: Denies SOB, cough, wheezing GI: Denies abdominal pain, nausea, vomiting, diarrhea, constipation GU: Denies dysuria, hematuria, frequency, vaginal discharge Msk: Denies muscle cramps, joint pains Neuro: Denies weakness, numbness, tingling Skin: Denies rashes, bruising +pruritis Psych: Denies depression, anxiety, hallucinations     Objective:    Blood pressure (!) 148/88,  pulse 80, temperature 98.5 F (36.9 C), temperature source Oral, weight 169 lb 12.8 oz (77 kg), SpO2 97 %.   Gen. Pleasant, well-nourished, in no distress, normal affect.  Pt argumentive with her daughter. HEENT: [place]/AT, face symmetric, conjunctiva clear, no scleral icterus, PERRLA, EOMI, nares patent without drainage Lungs: no accessory muscle use, CTAB, no wheezes or rales Cardiovascular: RRR, no m/r/g, no peripheral edema Musculoskeletal: No deformities, no cyanosis or clubbing, normal tone Neuro:  A&Ox2 (person and place), CN II-XII intact, normal gait Skin:  Warm, no lesions/ rash   Wt Readings from Last 3 Encounters:  08/22/20 169 lb 12.8 oz (77 kg)  07/25/20 168 lb (76.2 kg)  07/11/20 170 lb (77.1 kg)    Lab Results  Component Value Date   WBC 6.3 07/11/2020   HGB 12.8 07/11/2020   HCT 38.2 07/11/2020   PLT 217 07/11/2020   GLUCOSE 175 (H) 07/11/2020   ALT 17 07/11/2020   AST 21 07/11/2020   NA 137 07/11/2020   K 4.4 07/11/2020   CL 101 07/11/2020   CREATININE 0.79 07/11/2020   BUN 16 07/11/2020   CO2 30 07/11/2020   TSH 1.05 07/11/2020   HGBA1C 9.2 (H) 07/11/2020    Assessment/Plan:  Type 2 diabetes mellitus with diabetic neuropathy, with long-term current use of insulin (HCC)  -Hemoglobin A1c 8.4% next visit -FSBS 271 this visit -Patient and daughter advised no changes to be made to insulin regimen as pt is not  checking fsbs nor novolog consistently -Patient advised to contact home health agency for assistance with medication, fsbs, and BP -lifestyle modifications strongly encouraged. - Plan: POCT glycosylated hemoglobin (Hb A1C), POCT Glucose (Device for Home Use)  Essential hypertension -elevated -recheck 148/88 -Discussed importance of taking medications daily. -Continue current medications including losartan-HCTZ 100-25 mg -Discussed contacting HH to help with bp checks.  Dementia without behavioral disturbance, unspecified dementia type [facility])   -family involvement strongly encouraged -TSH and free T4 normal on 07/11/20 -consider obtaining vitamin B12 -CT head w/wo contrast ordered 07/25/20. - Plan: Ambulatory referral to Neurology  H/o depression -concern for possible VH -discussed appt with Psychiatry for evaluation.   F/u in 1 month  Abbe Amsterdam, MD

## 2020-08-22 NOTE — Patient Instructions (Signed)

## 2020-08-24 DIAGNOSIS — L299 Pruritus, unspecified: Secondary | ICD-10-CM | POA: Diagnosis not present

## 2020-08-24 DIAGNOSIS — E1140 Type 2 diabetes mellitus with diabetic neuropathy, unspecified: Secondary | ICD-10-CM | POA: Diagnosis not present

## 2020-08-24 DIAGNOSIS — F32A Depression, unspecified: Secondary | ICD-10-CM | POA: Diagnosis not present

## 2020-08-24 DIAGNOSIS — I10 Essential (primary) hypertension: Secondary | ICD-10-CM | POA: Diagnosis not present

## 2020-08-24 DIAGNOSIS — M1990 Unspecified osteoarthritis, unspecified site: Secondary | ICD-10-CM | POA: Diagnosis not present

## 2020-08-24 DIAGNOSIS — G43909 Migraine, unspecified, not intractable, without status migrainosus: Secondary | ICD-10-CM | POA: Diagnosis not present

## 2020-08-31 ENCOUNTER — Telehealth: Payer: Self-pay | Admitting: Family Medicine

## 2020-08-31 NOTE — Telephone Encounter (Signed)
[facility] [phone]  The patient wants to change both of her insulin that she is on because the insulin is not lowering her Blood Sugar. It is staying in the range of 200/300   Please dvise

## 2020-08-31 NOTE — Telephone Encounter (Signed)
Please advise 

## 2020-09-19 NOTE — Telephone Encounter (Signed)
Patient should make an appointment.

## 2020-09-20 NOTE — Telephone Encounter (Signed)
Spoke with pt will discuss about insulin at the appointment

## 2020-09-20 NOTE — Telephone Encounter (Signed)
Pt has an appointment on 09/26/2020, aware to discuss request at the appointment

## 2020-09-26 ENCOUNTER — Other Ambulatory Visit: Payer: Self-pay

## 2020-09-26 ENCOUNTER — Encounter: Payer: Self-pay | Admitting: Family Medicine

## 2020-09-26 ENCOUNTER — Ambulatory Visit (INDEPENDENT_AMBULATORY_CARE_PROVIDER_SITE_OTHER): Payer: Medicare Other | Admitting: Family Medicine

## 2020-09-26 VITALS — BP 150/84 | HR 66 | Temp 98.4°F | Wt 170.0 lb

## 2020-09-26 DIAGNOSIS — I10 Essential (primary) hypertension: Secondary | ICD-10-CM | POA: Diagnosis not present

## 2020-09-26 DIAGNOSIS — Z794 Long term (current) use of insulin: Secondary | ICD-10-CM

## 2020-09-26 DIAGNOSIS — E1140 Type 2 diabetes mellitus with diabetic neuropathy, unspecified: Secondary | ICD-10-CM

## 2020-09-26 DIAGNOSIS — F0390 Unspecified dementia without behavioral disturbance: Secondary | ICD-10-CM

## 2020-09-26 NOTE — Progress Notes (Signed)
Subjective:    Patient ID: Karen Powell, female    DOB: 07/20/1936, 85 y.o.   MRN: [id number]  No chief complaint on file.   HPI Patient was seen today for f/u on DM and HTN.  Pt alone this OFV.  Fsbs readings with her ranging from 41-348.  Pt states she doesn't have trouble taking her meds or with meals.  States ate a piece of candy for hypoglycemia this am.  Pt lives alone.  Past Medical History:  Diagnosis Date  . Arthritis   . Blood transfusion without reported diagnosis   . Depression   . Diabetic neuropathy (HCC)   . Dizziness   . Frequent headaches   . Glaucoma   . Hyperlipidemia   . Hypertension   . Migraines   . Palpitations   . Urinary incontinence     Allergies  Allergen Reactions  . Penicillins Anaphylaxis    ROS General: Denies fever, chills, night sweats, changes in weight, changes in appetite HEENT: Denies headaches, ear pain, changes in vision, rhinorrhea, sore throat CV: Denies CP, palpitations, SOB, orthopnea Pulm: Denies SOB, cough, wheezing GI: Denies abdominal pain, nausea, vomiting, diarrhea, constipation GU: Denies dysuria, hematuria, frequency, vaginal discharge Msk: Denies muscle cramps, joint pains Neuro: Denies weakness, numbness, tingling Skin: Denies rashes, bruising Psych: Denies depression, anxiety, hallucinations    Objective:    Blood pressure (!) 168/98, pulse 66, temperature 98.4 F (36.9 C), temperature source Oral, weight 170 lb (77.1 kg), SpO2 98 %.  Gen. 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   Skin:  Warm, no lesions/ rash  Wt Readings from Last 3 Encounters:  09/26/20 170 lb (77.1 kg)  08/22/20 169 lb 12.8 oz (77 kg)  07/25/20 168 lb (76.2 kg)    Lab Results  Component Value Date   WBC 6.3 07/11/2020   HGB 12.8 07/11/2020   HCT 38.2 07/11/2020   PLT 217 07/11/2020   GLUCOSE 175 (H) 07/11/2020   ALT 17 07/11/2020   AST 21 07/11/2020   NA 137 07/11/2020   K 4.4 07/11/2020   CL 101 07/11/2020   CREATININE 0.79 07/11/2020   BUN 16 07/11/2020   CO2 30 07/11/2020   TSH 1.05 07/11/2020   HGBA1C 8.4 (A) 08/22/2020    Assessment/Plan:  Dementia without behavioral disturbance, unspecified dementia type (HCC) -family involvement encouraged in pt's care at previous OFVs.  Will reach out to [employer], pt's daughter, [phone] who is listed on contacts. -concern for pt's ability to manage DM. -In the past HH had difficulty scheduling visits with pt as she does not answer phone calls from numbers she does not recognize.  - Plan: AMB Referral to [facility]  Essential hypertension  -elevated -hesitant to make adjustments to medications to avoid confusion -continue Toprol XL 50 mg in am and 25 mg in pm, losartan-HCTZ 100-25 mg daily -continue f/u with Cardiology - Plan: [facility]B [facility]  Type 2 diabetes mellitus with diabetic neuropathy, with long-term current use of insulin (HCC)  -fsbs liable with several hypoglycemic episodes.  fsbs  41 this am at home -reviewed s/s of hypo and hyperglycemia -discussed reducing insulin doses (Novolog and lantus). - Plan: AMB Referral to [facility]  F/u in 2-4 wks  Abbe Amsterdam, MD

## 2020-10-06 DIAGNOSIS — Z23 Encounter for immunization: Secondary | ICD-10-CM | POA: Diagnosis not present

## 2020-10-16 ENCOUNTER — Telehealth: Payer: Self-pay

## 2020-10-16 NOTE — Telephone Encounter (Signed)
**Note De-Identified  Obfuscation** Letter received from [facility] stating that they have approved a tier exception on the pts Toprol XL. Approval is valid until 10/15/2021  I have notified the [facility] pharmacy of this approval.

## 2020-10-18 ENCOUNTER — Telehealth: Payer: Self-pay | Admitting: Family Medicine

## 2020-10-18 ENCOUNTER — Telehealth: Payer: Self-pay

## 2020-10-18 NOTE — Telephone Encounter (Signed)
I think you meant to send this to someone else, I have never seen this patient and she does not come to the [facility]  Thanks Latrelle Dodrill, MD

## 2020-10-18 NOTE — Telephone Encounter (Signed)
   Telephone encounter was:  Unsuccessful.  10/18/2020 Name: Karen Powell MRN: [id number] DOB: 1935-12-17  Unsuccessful outbound call made today to assist with:  Food Insecurity and Unable to leave message voicemail did not pick up.  Outreach Attempt:  1st Attempt    Resha Filippone, AAS Paralegal, [facility] Care Guide . Embedded Care Coordination [facility]  Care Management  [address] [place], [place] [address] [email or web address]  ?? [phone]   [email or web address]

## 2020-10-18 NOTE — Telephone Encounter (Signed)
[facility] [phone] Fax: [phone]  [facility] called wanting to know if we have received a requisition form for Cardiac Care that they sent on 01/18.  [facility] stated that this form is time sensitive, that the patient is waiting.  Please advise

## 2020-10-23 ENCOUNTER — Other Ambulatory Visit: Payer: Self-pay | Admitting: Family Medicine

## 2020-10-23 DIAGNOSIS — E1140 Type 2 diabetes mellitus with diabetic neuropathy, unspecified: Secondary | ICD-10-CM

## 2020-10-24 ENCOUNTER — Encounter: Payer: Self-pay | Admitting: Family Medicine

## 2020-10-24 ENCOUNTER — Other Ambulatory Visit: Payer: Self-pay

## 2020-10-24 ENCOUNTER — Ambulatory Visit (INDEPENDENT_AMBULATORY_CARE_PROVIDER_SITE_OTHER): Payer: Medicare Other | Admitting: Family Medicine

## 2020-10-24 VITALS — BP 160/90 | HR 87 | Temp 98.6°F | Wt 169.0 lb

## 2020-10-24 DIAGNOSIS — I10 Essential (primary) hypertension: Secondary | ICD-10-CM | POA: Diagnosis not present

## 2020-10-24 DIAGNOSIS — F0390 Unspecified dementia without behavioral disturbance: Secondary | ICD-10-CM | POA: Diagnosis not present

## 2020-10-24 DIAGNOSIS — Z794 Long term (current) use of insulin: Secondary | ICD-10-CM | POA: Diagnosis not present

## 2020-10-24 DIAGNOSIS — E1140 Type 2 diabetes mellitus with diabetic neuropathy, unspecified: Secondary | ICD-10-CM | POA: Diagnosis not present

## 2020-10-24 MED ORDER — NOVOLOG FLEXPEN 100 UNIT/ML ~~LOC~~ SOPN: PEN_INJECTOR | SUBCUTANEOUS | 11 refills | Status: DC

## 2020-10-24 MED ORDER — LOSARTAN POTASSIUM-HCTZ 100-25 MG PO TABS: 1.0000 | ORAL_TABLET | Freq: Every day | ORAL | 3 refills | Status: DC

## 2020-10-24 MED ORDER — LANTUS SOLOSTAR 100 UNIT/ML ~~LOC~~ SOPN: PEN_INJECTOR | SUBCUTANEOUS | 11 refills | Status: DC

## 2020-10-24 NOTE — Patient Instructions (Addendum)
[facility] with [facility] [phone]   You may have to use the mail order pharmacy to get your medication in the 90 day supply.  Sometimes the insurance companies require this.   Low-Sodium Eating Plan Sodium, which is an element that makes up salt, helps you maintain a healthy balance of fluids in your body. Too much sodium can increase your blood pressure and cause fluid and waste to be held in your body. Your health care provider or dietitian may recommend following this plan if you have high blood pressure (hypertension), kidney disease, liver disease, or heart failure. Eating less sodium can help lower your blood pressure, reduce swelling, and protect your heart, liver, and kidneys. What are tips for following this plan? Reading food labels  The Nutrition Facts label lists the amount of sodium in one serving of the food. If you eat more than one serving, you must multiply the listed amount of sodium by the number of servings.  Choose foods with less than 140 mg of sodium per serving.  Avoid foods with 300 mg of sodium or more per serving. Shopping  Look for lower-sodium products, often labeled as "low-sodium" or "no salt added."  Always check the sodium content, even if foods are labeled as "unsalted" or "no salt added."  Buy fresh foods. ? Avoid canned foods and pre-made or frozen meals. ? Avoid canned, cured, or processed meats.  Buy breads that have less than 80 mg of sodium per slice.   Cooking  Eat more home-cooked food and less restaurant, buffet, and fast food.  Avoid adding salt when cooking. Use salt-free seasonings or herbs instead of table salt or sea salt. Check with your health care provider or pharmacist before using salt substitutes.  Cook with plant-based oils, such as canola, sunflower, or olive oil.   Meal planning  When eating at a restaurant, ask that your food be prepared with less salt or no salt, if possible. Avoid  dishes labeled as brined, pickled, cured, smoked, or made with soy sauce, miso, or teriyaki sauce.  Avoid foods that contain MSG (monosodium glutamate). MSG is sometimes added to [place] food, bouillon, and some canned foods.  Make meals that can be grilled, baked, poached, roasted, or steamed. These are generally made with less sodium. General information Most people on this plan should limit their sodium intake to 1,500-2,000 mg (milligrams) of sodium each day. What foods should I eat? Fruits Fresh, frozen, or canned fruit. Fruit juice. Vegetables Fresh or frozen vegetables. "No salt added" canned vegetables. "No salt added" tomato sauce and paste. Low-sodium or reduced-sodium tomato and vegetable juice. Grains Low-sodium cereals, including oats, puffed wheat and rice, and shredded wheat. Low-sodium crackers. Unsalted rice. Unsalted pasta. Low-sodium bread. Whole-grain breads and whole-grain pasta. Meats and other proteins Fresh or frozen (no salt added) meat, poultry, seafood, and fish. Low-sodium canned tuna and salmon. Unsalted nuts. Dried peas, beans, and lentils without added salt. Unsalted canned beans. Eggs. Unsalted nut butters. Dairy Milk. Soy milk. Cheese that is naturally low in sodium, such as ricotta cheese, fresh mozzarella, or Swiss cheese. Low-sodium or reduced-sodium cheese. Cream cheese. Yogurt. Seasonings and condiments Fresh and dried herbs and spices. Salt-free seasonings. Low-sodium mustard and ketchup. Sodium-free salad dressing. Sodium-free light mayonnaise. Fresh or refrigerated horseradish. Lemon juice. Vinegar. Other foods Homemade, reduced-sodium, or low-sodium soups. Unsalted popcorn and pretzels. Low-salt or salt-free chips. The items listed above may not be a complete list of foods and beverages you can  eat. Contact a dietitian for more information. What foods should I avoid? Vegetables Sauerkraut, pickled vegetables, and relishes. Olives. [place] fries. Onion  rings. Regular canned vegetables (not low-sodium or reduced-sodium). Regular canned tomato sauce and paste (not low-sodium or reduced-sodium). Regular tomato and vegetable juice (not low-sodium or reduced-sodium). Frozen vegetables in sauces. Grains Instant hot cereals. Bread stuffing, pancake, and biscuit mixes. Croutons. Seasoned rice or pasta mixes. Noodle soup cups. Boxed or frozen macaroni and cheese. Regular salted crackers. Self-rising flour. Meats and other proteins Meat or fish that is salted, canned, smoked, spiced, or pickled. Precooked or cured meat, such as sausages or meat loaves. Karen Powell. Ham. Pepperoni. Hot dogs. Corned beef. Chipped beef. Salt pork. Jerky. Pickled herring. Anchovies and sardines. Regular canned tuna. Salted nuts. Dairy Processed cheese and cheese spreads. Hard cheeses. Cheese curds. Blue cheese. Feta cheese. String cheese. Regular cottage cheese. Buttermilk. Canned milk. Fats and oils Salted butter. Regular margarine. Ghee. Bacon fat. Seasonings and condiments Onion salt, garlic salt, seasoned salt, table salt, and sea salt. Canned and packaged gravies. Worcestershire sauce. Tartar sauce. Barbecue sauce. Teriyaki sauce. Soy sauce, including reduced-sodium. Steak sauce. Fish sauce. Oyster sauce. Cocktail sauce. Horseradish that you find on the shelf. Regular ketchup and mustard. Meat flavorings and tenderizers. Bouillon cubes. Hot sauce. Pre-made or packaged marinades. Pre-made or packaged taco seasonings. Relishes. Regular salad dressings. Salsa. Other foods Salted popcorn and pretzels. Corn chips and puffs. Potato and tortilla chips. Canned or dried soups. Pizza. Frozen entrees and pot pies. The items listed above may not be a complete list of foods and beverages you should avoid. Contact a dietitian for more information. Summary  Eating less sodium can help lower your blood pressure, reduce swelling, and protect your heart, liver, and kidneys.  Most people on this  plan should limit their sodium intake to 1,500-2,000 mg (milligrams) of sodium each day.  Canned, boxed, and frozen foods are high in sodium. Restaurant foods, fast foods, and pizza are also very high in sodium. You also get sodium by adding salt to food.  Try to cook at home, eat more fresh fruits and vegetables, and eat less fast food and canned, processed, or prepared foods. This information is not intended to replace advice given to you by your health care provider. Make sure you discuss any questions you have with your health care provider. Document Revised: 10/14/2019 Document Reviewed: 08/10/2019 Elsevier Patient Education  2021 [place].  Type 2 Diabetes Mellitus, Self-Care, Adult When you have type 2 diabetes (type 2 diabetes mellitus), you must make sure your blood sugar (glucose) stays in a healthy range. You can do this with:  Nutrition.  Exercise.  Lifestyle changes.  Medicines or insulin, if needed.  Support from your doctors and others. What are the risks? Having diabetes can raise your risk for other long-term (chronic) health problems. You may get medicines to help prevent these problems. How to stay aware of blood sugar  Check your blood sugar level every day, as often as told.  Have your A1C (hemoglobin A1C) level checked two or more times a year. Have it checked more often if told.  Your doctor will set personal treatment goals for you. In general, you should have these blood sugar levels: ? Before meals: 80-130 mg/dL (4.4-7.2 mmol/L). ? After meals: below 180 mg/dL (10 mmol/L). ? A1C: less than 7%.   How to manage high and low blood sugar Symptoms of high blood sugar High blood sugar is also called hyperglycemia. Know the  symptoms of high blood sugar. These may include:  More thirst.  Hunger.  Feeling very tired.  Needing to pee (urinate) more often than normal.  Seeing things blurry. Symptoms of low blood sugar Low blood sugar is also called  hypoglycemia. This is when blood sugar is at or below 70 mg/dL (3.9 mmol/L). Symptoms may include:  Hunger.  Feeling worried or nervous (anxious).  Feeling sweaty and cold to the touch (clammy).  Being dizzy or light-headed.  Feeling sleepy.  A fast heartbeat.  Feeling grouchy (irritable).  Tingling or loss of feeling (numbness) around your mouth, lips, or tongue.  Restless sleep. Diabetes medicines can cause low blood sugar. You are more at risk:  While you exercise.  After exercise.  During sleep.  When you are sick.  When you skip meals or do not eat for a long time. Treating low blood sugar If you think you have low blood sugar, eat or drink something sugary right away. Keep 15 grams of a fast-acting carb (carbohydrate) with you all the time. Make sure your family and friends know how to treat you if you cannot treat yourself. Treating very low blood sugar Severe hypoglycemia is when your blood sugar is at or below 54 mg/dL (3 mmol/L). Severe hypoglycemia is an emergency. Do not wait to see if the symptoms will go away. Get medical help right away. Call your local emergency services (911 in the [place] Do not drive yourself to the hospital. You may need a glucagon shot if you have very low blood sugar and you cannot eat or drink. Have a family member or friend learn how to check your blood sugar and how to give you a glucagon shot. Ask your doctor if you should have a kit for glucagon shots. Follow these instructions at home: Medicines  Take diabetes medicines as told. If your doctor prescribed insulin or diabetes medicines, take them each day.  Do not run out of insulin or other medicines. Plan ahead.  If you use insulin, change the amount you take based on how active you are and what foods you eat. Your doctor will tell you how to do this.  Take over-the-counter and prescription medicines only as told by your doctor. Eating and drinking  Eat healthy foods. These  include: ? Low-fat (lean) proteins. ? Complex carbs, such as whole grains. ? Fresh fruits and vegetables. ? Low-fat dairy products. ? Healthy fats.  Meet with a food expert (dietitian) to make an eating plan.  Follow instructions from your doctor about what you cannot eat or drink.  Drink enough fluid to keep your pee (urine) pale yellow.  Keep track of carbs that you eat. Read food labels and learn serving sizes of foods.  Follow your sick-day plan when you cannot eat or drink as normal. Make this plan with your doctor so it is ready to use.   Activity  Exercise as told by your doctor. You may need to: ? Do stretching and strength exercises 2 or more times a week. ? Do 150 minutes or more of exercise each week that makes your heart beat faster and makes you sweat.  Spread out your exercise over 3 or more days a week.  Do not go more than 2 days in a row without exercise.  Talk with your doctor before you start a new exercise. Your doctor may tell you to change: ? How much insulin or medicines you take. ? How much food you eat. Lifestyle  Do  not use any products that contain nicotine or tobacco, such as cigarettes, e-cigarettes, and chewing tobacco. If you need help quitting, ask your doctor.  If you drink alcohol and your doctor says alcohol is safe for you: ? Limit how much you use to:  0-1 drink a day for women who are not pregnant.  0-2 drinks a day for men. ? Be aware of how much alcohol is in your drink. In the [place] one drink equals one 12 oz bottle of beer (355 mL), one 5 oz glass of wine (148 mL), or one 1 oz glass of hard liquor (44 mL).  Learn to deal with stress. If you need help, ask your doctor. Body care  Stay up to date with your shots (immunizations).  Have your eyes and feet checked by a doctor as often as told.  Check your skin and feet every day. Check for cuts, bruises, redness, blisters, or sores.  Brush your teeth and gums two times a day.  Floss one or more times a day.  Go to the dentist one or more times every 6 months.  Stay at a healthy weight.   General instructions  Share your diabetes care plan with: ? Your work or school. ? People you live with.  Carry a card or wear jewelry that says you have diabetes.  Keep all follow-up visits as told by your doctor. This is important. Questions to ask your doctor  Do I need to meet with a certified expert in diabetes education and care?  Where can I find a support group? Where to find more information  American Diabetes Association: www.diabetes.org  American Association of Diabetes Care and Education Specialists: www.diabeteseducator.org  International Diabetes Federation: MemberVerification.ca Summary  When you have type 2 diabetes, you must make sure your blood sugar (glucose) stays in a healthy range. You can do this with nutrition, exercise, medicines and insulin, and support from doctors and others.  Check your blood sugar every day, or as often as told.  Having diabetes can raise your risk for other long-term health problems. You may get medicines to help prevent these problems.  Share your diabetes management plan with people at work, school, and home.  Keep all follow-up visits as told by your doctor. This is important. This information is not intended to replace advice given to you by your health care provider. Make sure you discuss any questions you have with your health care provider. Document Revised: 04/11/2020 Document Reviewed: 04/11/2020 Elsevier Patient Education  [place].

## 2020-10-24 NOTE — Progress Notes (Addendum)
Subjective:    Patient ID: Karen Powell, female    DOB: [birth year], 85 y.o.   MRN: [id number]  No chief complaint on file. Pt accompanied by granddaughter, Bronson Ing.  HPI Patient is an 85 yo female with pmh sig for dementia, DM II, HTN, OAB, GERD, HLD, palpitations who was seen today for f/u.  Pt states she the local pharmacy will not give her a 90 day supply of medicine despite rx's written as such.  Pt does not trust the reliability of mail order pharmacy.  Pt may skip doses of insulin if she does not feel she needs it.  Eating salty, pre-packaged foods.  Pt missed phone calls about referral to  [facility] as she does not answer phone numbers she does not recognize.  Pt was receiving HH services to help with meds and bp checks, however she has not contacted the nurse in regards to restarting services.  Past Medical History:  Diagnosis Date  . Arthritis   . Blood transfusion without reported diagnosis   . Depression   . Diabetic neuropathy (HCC)   . Dizziness   . Frequent headaches   . Glaucoma   . Hyperlipidemia   . Hypertension   . Migraines   . Palpitations   . Urinary incontinence     Allergies  Allergen Reactions  . Penicillins Anaphylaxis    ROS General: Denies fever, chills, night sweats, changes in weight, changes in appetite  +dementia HEENT: Denies headaches, ear pain, changes in vision, rhinorrhea, sore throat CV: Denies CP, palpitations, SOB, orthopnea Pulm: Denies SOB, cough, wheezing GI: Denies abdominal pain, nausea, vomiting, diarrhea, constipation GU: Denies dysuria, hematuria, frequency, vaginal discharge Msk: Denies muscle cramps, joint pains Neuro: Denies weakness, numbness, tingling Skin: Denies rashes, bruising Psych: Denies depression, anxiety, hallucinations    Objective:    Blood pressure (!) 160/90, pulse 87, temperature 98.6 F (37 C), temperature source Oral, weight 169 lb (76.7 kg), SpO2 96 %.  Gen. Pleasant, well-nourished, in no  distress, normal affect   HEENT: [place]/AT, face symmetric, conjunctiva clear, no scleral icterus, PERRLA, EOMI, nares patent without drainage Lungs: no accessory muscle use, CTAB, no wheezes or rales Cardiovascular: RRR, no m/r/g, no peripheral edema Musculoskeletal: No deformities, no cyanosis or clubbing, normal tone Neuro:  A&Ox3, CN II-XII intact, normal gait Skin:  Warm, no lesions/ rash   Wt Readings from Last 3 Encounters:  09/26/20 170 lb (77.1 kg)  08/22/20 169 lb 12.8 oz (77 kg)  07/25/20 168 lb (76.2 kg)    Lab Results  Component Value Date   WBC 6.3 07/11/2020   HGB 12.8 07/11/2020   HCT 38.2 07/11/2020   PLT 217 07/11/2020   GLUCOSE 175 (H) 07/11/2020   ALT 17 07/11/2020   AST 21 07/11/2020   NA 137 07/11/2020   K 4.4 07/11/2020   CL 101 07/11/2020   CREATININE 0.79 07/11/2020   BUN 16 07/11/2020   CO2 30 07/11/2020   TSH 1.05 07/11/2020   HGBA1C 8.4 (A) 08/22/2020    Assessment/Plan:  Type 2 diabetes mellitus with diabetic neuropathy, with long-term current use of insulin (HCC) -stable.  -hgb A1C 8.4% on 08/22/20 -discussed the importance of eating regular meals -given pt's sporadic eating and compliance issues hesitant to make any med adjustments to avoid confusion especially with insulin. -Resuming HH strongly encouraged. -continue metformin 500 mgBID, novolog 15 u TID, and lantus 55 u qhs - Plan: insulin glargine (LANTUS SOLOSTAR) 100 UNIT/ML Solostar Pen  Essential hypertension  -uncontrolled.  Likely 2/2 compliance issues due to dementia. -pt and granddaughter encouraged to restart [facility] services for med management and bp checks. -continue current meds - Plan: losartan-hydrochlorothiazide (HYZAAR) 100-25 MG tablet  Dementia without behavioral disturbance, unspecified dementia type (HCC) -PHQ 9 score 11  -GAD 7 score 9 -referral to Neurology for Neuropsych testing placed 08/22/20 however pt declined appt. -discussed BH referral.  Declines at this  time. -CT head with and w/o contrast ordered 07/25/20, however not scheduled.  Will inquire about details. -discussed the importance of family involvement in pt's health. -given continued concerns regarding compliance frequent f/u advised.  F/u in 4 wks, sooner if needed.  Abbe Amsterdam, MD

## 2020-10-26 ENCOUNTER — Telehealth: Payer: Self-pay

## 2020-10-26 NOTE — Telephone Encounter (Signed)
   Telephone encounter was:  Unsuccessful.  10/26/2020 Name: Karen Powell MRN: [id number] DOB: [birth year]  Unsuccessful outbound call made today to assist with:  Food Insecurity  Outreach Attempt:  2nd Attempt  Unable to leave message voicemail did not pick-up.  Tyren Dugar, AAS Paralegal, [facility] Care Guide . Embedded Care Coordination [facility]  Care Management  [address] [place], [place] [address] ??[email or web address]  ?? [phone]   [email or web address]

## 2020-10-26 NOTE — Telephone Encounter (Signed)
Pt had an office visit at the office on 10/25/2020 with Dr Salomon Fick, denies agreeing to have any cardiac care from any medical service

## 2020-10-28 ENCOUNTER — Other Ambulatory Visit: Payer: Self-pay | Admitting: Family Medicine

## 2020-10-28 DIAGNOSIS — E1140 Type 2 diabetes mellitus with diabetic neuropathy, unspecified: Secondary | ICD-10-CM

## 2020-10-30 ENCOUNTER — Telehealth: Payer: Self-pay | Admitting: Family Medicine

## 2020-10-30 NOTE — Telephone Encounter (Signed)
Karen Powell w/[facility] is calling in to check the status of a requisition form that has been sent 2 weeks ago and they are needing it back on 10/31/2020.

## 2020-10-31 ENCOUNTER — Telehealth: Payer: Self-pay

## 2020-10-31 NOTE — Telephone Encounter (Signed)
   Telephone encounter was:  Successful.  10/31/2020 Name: Shauni Henner MRN: [id number] DOB: Feb 02, 1936  Karen Powell is a 85 y.o. year old female who is a primary care patient of Salomon Fick, Bettey Mare, MD . The community resource team was consulted for assistance with Food Insecurity  Care guide performed the following interventions: Follow up call placed to community resources to determine status of patients referral Follow up call placed to the patient to discuss status of referral Spoke with Elijah Birk at [facility] patient's meals will start on 11/01/2020. Ashlyn spoke with patient's daughter to get information for intake form to be placed on permanent route for meal delivery after 30 days. [facility] has contract with [facility]..  Follow Up Plan:  No further follow up planned at this time. The patient has been provided with needed resources.  Makinsley Schiavi, AAS Paralegal, [facility] Care Guide . Embedded Care Coordination [facility] Health  Care Management  [address] [place], [place] [address] [email or web address]  ?? [phone]   [email or web address]

## 2020-10-31 NOTE — Telephone Encounter (Signed)
Tried to return Vonna Kotyk call with no option to leave a message, Our office faxed pt Requisition with a message that pt had a visit at our clinic last week and stated that she did not request the service requested. Please notify this office that pt is not interested in their services.

## 2020-11-01 ENCOUNTER — Other Ambulatory Visit: Payer: Self-pay

## 2020-11-01 DIAGNOSIS — E1140 Type 2 diabetes mellitus with diabetic neuropathy, unspecified: Secondary | ICD-10-CM

## 2020-11-01 MED ORDER — ALCOHOL SWABS PADS: 1.0000 | MEDICATED_PAD | Freq: Three times a day (TID) | 1 refills | Status: DC

## 2020-11-10 ENCOUNTER — Encounter: Payer: Self-pay | Admitting: Family Medicine

## 2020-11-12 NOTE — Telephone Encounter (Signed)
Pt does not need any of the services listed a fax was sent to [facility]

## 2020-11-21 ENCOUNTER — Ambulatory Visit (INDEPENDENT_AMBULATORY_CARE_PROVIDER_SITE_OTHER): Payer: Medicare Other | Admitting: Family Medicine

## 2020-11-21 ENCOUNTER — Other Ambulatory Visit: Payer: Self-pay

## 2020-11-21 ENCOUNTER — Encounter: Payer: Self-pay | Admitting: Family Medicine

## 2020-11-21 VITALS — BP 120/78 | HR 68 | Temp 98.3°F | Wt 170.6 lb

## 2020-11-21 DIAGNOSIS — I10 Essential (primary) hypertension: Secondary | ICD-10-CM | POA: Diagnosis not present

## 2020-11-21 DIAGNOSIS — F0390 Unspecified dementia without behavioral disturbance: Secondary | ICD-10-CM | POA: Diagnosis not present

## 2020-11-21 DIAGNOSIS — E1140 Type 2 diabetes mellitus with diabetic neuropathy, unspecified: Secondary | ICD-10-CM

## 2020-11-21 DIAGNOSIS — Z794 Long term (current) use of insulin: Secondary | ICD-10-CM | POA: Diagnosis not present

## 2020-11-21 LAB — GLUCOSE, POCT (MANUAL RESULT ENTRY): POC Glucose: 193 mg/dl — AB (ref 70–99)

## 2020-11-21 MED ORDER — LANTUS SOLOSTAR 100 UNIT/ML ~~LOC~~ SOPN: PEN_INJECTOR | SUBCUTANEOUS | 11 refills | Status: DC

## 2020-11-21 NOTE — Progress Notes (Signed)
Subjective:    Patient ID: Karen Powell, female    DOB: 08/17/1936, 85 y.o.   MRN: [id number]  Chief Complaint  Patient presents with  . Follow-up  Pt accompanied by her granddaughter, Katoria Yetman [phone].  HPI Patient is an 85 year old female with PMH SAG 4 migraines, palpitations, dementia, HTN, HLD, LAD, GERD who was seen for f/u.  Pt states her bs is good, but does not like checking it.  Had a continuous monitor but hasnot received refills in the mail for sensors.  [employer] delivering meals to pt.  Pt states they sent her fish, orange juice, pineapple juice, potatoes, etc. patient taking Lantus 55 units nightly and NovoLog 15 units 3 times daily with meals.  Pt declined Neruology appt as she did not know who they were. Pt's daughter staying with her for the next few wks as she recovers from foot surgery.  Patient has yet to contact home health in regards to resuming services.  Plans to schedule an appointment with Dr. Katrinka Blazing, cardiology.  Past Medical History:  Diagnosis Date  . Arthritis   . Blood transfusion without reported diagnosis   . Depression   . Diabetic neuropathy (HCC)   . Dizziness   . Frequent headaches   . Glaucoma   . Hyperlipidemia   . Hypertension   . Migraines   . Palpitations   . Urinary incontinence     Allergies  Allergen Reactions  . Penicillins Anaphylaxis    ROS General: Denies fever, chills, night sweats, changes in weight, changes in appetite  +dementia HEENT: Denies headaches, ear pain, changes in vision, rhinorrhea, sore throat CV: Denies CP, palpitations, SOB, orthopnea Pulm: Denies SOB, cough, wheezing GI: Denies abdominal pain, nausea, vomiting, diarrhea, constipation GU: Denies dysuria, hematuria, frequency, vaginal discharge Msk: Denies muscle cramps, joint pains Neuro: Denies weakness, numbness, tingling Skin: Denies rashes, bruising Psych: Denies depression, anxiety, hallucinations    Objective:    Blood pressure  120/78, pulse 68, temperature 98.3 F (36.8 C), temperature source Oral, weight 170 lb 9.6 oz (77.4 kg), SpO2 98 %.  Gen. Pleasant, well-nourished, in no distress, normal affect  HEENT: [place]/AT, face symmetric, conjunctiva clear, no scleral icterus, PERRLA, EOMI, nares patent without drainage Lungs: no accessory muscle use, CTAB, no wheezes or rales Cardiovascular: RRR, no m/r/g, no peripheral edema Musculoskeletal: No deformities, no cyanosis or clubbing, normal tone Neuro:  A&Ox2( person and place), CN II-XII intact, normal gait Skin:  Warm, no lesions/ rash   Wt Readings from Last 3 Encounters:  11/21/20 170 lb 9.6 oz (77.4 kg)  10/24/20 169 lb (76.7 kg)  09/26/20 170 lb (77.1 kg)    Lab Results  Component Value Date   WBC 6.3 07/11/2020   HGB 12.8 07/11/2020   HCT 38.2 07/11/2020   PLT 217 07/11/2020   GLUCOSE 175 (H) 07/11/2020   ALT 17 07/11/2020   AST 21 07/11/2020   NA 137 07/11/2020   K 4.4 07/11/2020   CL 101 07/11/2020   CREATININE 0.79 07/11/2020   BUN 16 07/11/2020   CO2 30 07/11/2020   TSH 1.05 07/11/2020   HGBA1C 8.4 (A) 08/22/2020    Assessment/Plan:  Essential hypertension -Controlled this visit -Continue current medications including Toprol XL 50 mg in a.m. and 25 mg in p.m. losartan-hydrochlorothiazide 100-25 mg daily -Lifestyle modification strongly encouraged including decreasing sodium intake -Continue to monitor  Type 2 diabetes mellitus with diabetic neuropathy, with long-term current use of insulin (HCC) -Hemoglobin A1c 8.4% on 08/22/2020 -  FSBS 193 today in clinic -Discussed the importance of lifestyle modifications and medication compliance -We will increase Lantus from 55 to 57 units nightly -Continue NovoLog 15 units 3 times daily with meals -Patient strongly encouraged to contact home health in regards to helping with medication management -Given recent changes in medication we will have patient follow-up monthly, sooner if  needed -Continue statin and ARB. - Plan: POC Glucose (CBG)  Dementia without behavioral disturbance, unspecified dementia type (HCC) -We will attempt to reopen referral for neuropsych testing/neurology.  May be easier to contact patient's granddaughter Terez Montee [phone] in regards to scheduling appointments. -Continue to monitor  F/u 1 month  Abbe Amsterdam, MD

## 2020-11-21 NOTE — Patient Instructions (Addendum)
We will need to increase your dose of lantus from 55 units to 57 units each evening. Diabetes Mellitus Action Plan Following a diabetes action plan is a way for you to manage your diabetes (diabetes mellitus) symptoms. The plan is color-coded to help you understand what actions you need to take based on any symptoms you are having.  If you have symptoms in the red zone, you need medical care right away.  If you have symptoms in the yellow zone, you are having problems.  If you have symptoms in the green zone, you are doing well. Learning about and understanding diabetes can take time. Follow the plan that you develop with your health care provider. Know the target range for your blood sugar (glucose) level, and review your treatment plan with your health care provider at each visit. The target range for my blood sugar level is __________________________ mg/dL. Red zone Get medical help right away if you have any of the following symptoms:  A blood sugar test result that is below 54 mg/dL (3 mmol/L).  A blood sugar test result that is at or above 240 mg/dL (69.6 mmol/L) for 2 days in a row.  Confusion or trouble thinking clearly.  Difficulty breathing.  Sickness or a fever for 2 or more days that is not getting better.  Moderate or large ketone levels in your urine.  Feeling tired or having no energy. If you have any red zone symptoms, do not wait to see if the symptoms will go away. Get medical help right away. Call your local emergency services (911 in the [place] Do not drive yourself to the hospital. If you have severely low blood sugar (severe hypoglycemia) and you cannot eat or drink, you may need glucagon. Make sure a family member or close friend knows how to check your blood sugar and how to give you glucagon. You may need to be treated in a hospital for this condition.   Yellow zone If you have any of the following symptoms, your diabetes is not under control and you may need to  make some changes:  A blood sugar test result that is at or above 240 mg/dL (29.5 mmol/L) for 2 days in a row.  Blood sugar test results that are below 70 mg/dL (3.9 mmol/L).  Other symptoms of hypoglycemia, such as: ? Shaking or feeling light-headed. ? Confusion or irritability. ? Feeling hungry. ? Having a fast heartbeat. If you have any yellow zone symptoms:  Treat your hypoglycemia by eating or drinking 15 grams of a rapid-acting carbohydrate. Follow the 15:15 rule: ? Take 15 grams of a rapid-acting carbohydrate, such as:  1 tube of glucose gel.  4 glucose pills.  4 oz (120 mL) of fruit juice.  4 oz (120 mL) of regular (not diet) soda. ? Check your blood sugar 15 minutes after you take the carbohydrate. ? If the repeat blood sugar test is still at or below 70 mg/dL (3.9 mmol/L), take 15 grams of a carbohydrate again. ? If your blood sugar does not increase above 70 mg/dL (3.9 mmol/L) after 3 tries, get medical help right away. ? After your blood sugar returns to normal, eat a meal or a snack within 1 hour.  Keep taking your daily medicines as told by your health care provider.  Check your blood sugar more often than you normally would. ? Write down your results. ? Call your health care provider if you have trouble keeping your blood sugar in your target range.  Green zone These signs mean you are doing well and you can continue what you are doing to manage your diabetes:  Your blood sugar is within your personal target range. For most people, a blood sugar level before a meal (preprandial) should be 80-130 mg/dL (1.6-1.04.4-7.2 mmol/L).  You feel well, and you are able to do daily activities. If you are in the green zone, continue to manage your diabetes as told by your health care provider. To do this:  Eat a healthy diet.  Exercise regularly.  Check your blood sugar as told by your health care provider.  Take your medicines as told by your health care provider.   Where  to find more information  American Diabetes Association (ADA): diabetes.org  Association of Diabetes Care & Education Specialists (ADCES): diabeteseducator.org Summary  Following a diabetes action plan is a way for you to manage your diabetes symptoms. The plan is color-coded to help you understand what actions you need to take based on any symptoms you are having.  Follow the plan that you develop with your health care provider. Make sure you know your personal target blood sugar level.  Review your treatment plan with your health care provider at each visit. This information is not intended to replace advice given to you by your health care provider. Make sure you discuss any questions you have with your health care provider. Document Revised: 03/15/2020 Document Reviewed: 03/15/2020 Elsevier Patient Education  2021 Elsevier Inc.  https://www.diabeteseducator.org/docs/default-source/living-with-diabetes/conquering-the-grocery-store-v1.pdf?sfvrsn=4">  Carbohydrate Counting for Diabetes Mellitus, Adult Carbohydrate counting is a method of keeping track of how many carbohydrates you eat. Eating carbohydrates naturally increases the amount of sugar (glucose) in the blood. Counting how many carbohydrates you eat improves your blood glucose control, which helps you manage your diabetes. It is important to know how many carbohydrates you can safely have in each meal. This is different for every person. A dietitian can help you make a meal plan and calculate how many carbohydrates you should have at each meal and snack. What foods contain carbohydrates? Carbohydrates are found in the following foods:  Grains, such as breads and cereals.  Dried beans and soy products.  Starchy vegetables, such as potatoes, peas, and corn.  Fruit and fruit juices.  Milk and yogurt.  Sweets and snack foods, such as cake, cookies, candy, chips, and soft drinks.   How do I count carbohydrates in foods? There  are two ways to count carbohydrates in food. You can read food labels or learn standard serving sizes of foods. You can use either of the methods or a combination of both. Using the Nutrition Facts label The Nutrition Facts list is included on the labels of almost all packaged foods and beverages in the [place] It includes:  The serving size.  Information about nutrients in each serving, including the grams (g) of carbohydrate per serving. To use the Nutrition Facts:  Decide how many servings you will have.  Multiply the number of servings by the number of carbohydrates per serving.  The resulting number is the total amount of carbohydrates that you will be having. Learning the standard serving sizes of foods When you eat carbohydrate foods that are not packaged or do not include Nutrition Facts on the label, you need to measure the servings in order to count the amount of carbohydrates.  Measure the foods that you will eat with a food scale or measuring cup, if needed.  Decide how many standard-size servings you will eat.  Multiply the  number of servings by 15. For foods that contain carbohydrates, one serving equals 15 g of carbohydrates. ? For example, if you eat 2 cups or 10 oz (300 g) of strawberries, you will have eaten 2 servings and 30 g of carbohydrates (2 servings x 15 g = 30 g).  For foods that have more than one food mixed, such as soups and casseroles, you must count the carbohydrates in each food that is included. The following list contains standard serving sizes of common carbohydrate-rich foods. Each of these servings has about 15 g of carbohydrates:  1 slice of bread.  1 six-inch (15 cm) tortilla.  ? cup or 2 oz (53 g) cooked rice or pasta.   cup or 3 oz (85 g) cooked or canned, drained and rinsed beans or lentils.   cup or 3 oz (85 g) starchy vegetable, such as peas, corn, or squash.   cup or 4 oz (120 g) hot cereal.   cup or 3 oz (85 g) boiled or mashed  potatoes, or  or 3 oz (85 g) of a large baked potato.   cup or 4 fl oz (118 mL) fruit juice.  1 cup or 8 fl oz (237 mL) milk.  1 small or 4 oz (106 g) apple.   or 2 oz (63 g) of a medium banana.  1 cup or 5 oz (150 g) strawberries.  3 cups or 1 oz (24 g) popped popcorn. What is an example of carbohydrate counting? To calculate the number of carbohydrates in this sample meal, follow the steps shown below. Sample meal  3 oz (85 g) chicken breast.  ? cup or 4 oz (106 g) brown rice.   cup or 3 oz (85 g) corn.  1 cup or 8 fl oz (237 mL) milk.  1 cup or 5 oz (150 g) strawberries with sugar-free whipped topping. Carbohydrate calculation 1. Identify the foods that contain carbohydrates: ? Rice. ? Corn. ? Milk. ? Strawberries. 2. Calculate how many servings you have of each food: ? 2 servings rice. ? 1 serving corn. ? 1 serving milk. ? 1 serving strawberries. 3. Multiply each number of servings by 15 g: ? 2 servings rice x 15 g = 30 g. ? 1 serving corn x 15 g = 15 g. ? 1 serving milk x 15 g = 15 g. ? 1 serving strawberries x 15 g = 15 g. 4. Add together all of the amounts to find the total grams of carbohydrates eaten: ? 30 g + 15 g + 15 g + 15 g = 75 g of carbohydrates total. What are tips for following this plan? Shopping  Develop a meal plan and then make a shopping list.  Buy fresh and frozen vegetables, fresh and frozen fruit, dairy, eggs, beans, lentils, and whole grains.  Look at food labels. Choose foods that have more fiber and less sugar.  Avoid processed foods and foods with added sugars. Meal planning  Aim to have the same amount of carbohydrates at each meal and for each snack time.  Plan to have regular, balanced meals and snacks. Where to find more information  American Diabetes Association: www.diabetes.org  Centers for Disease Control and Prevention: FootballExhibition.com.br Summary  Carbohydrate counting is a method of keeping track of how many  carbohydrates you eat.  Eating carbohydrates naturally increases the amount of sugar (glucose) in the blood.  Counting how many carbohydrates you eat improves your blood glucose control, which helps you manage your diabetes.  A dietitian can help you make a meal plan and calculate how many carbohydrates you should have at each meal and snack. This information is not intended to replace advice given to you by your health care provider. Make sure you discuss any questions you have with your health care provider. Document Revised: 09/08/2019 Document Reviewed: 09/09/2019 Elsevier Patient Education  2021 [employer].

## 2020-12-03 ENCOUNTER — Telehealth: Payer: Self-pay | Admitting: Family Medicine

## 2020-12-03 NOTE — Telephone Encounter (Signed)
Pt call and stated she was not able to get her supply.

## 2020-12-03 NOTE — Telephone Encounter (Signed)
Advised pt to call supplier and see if they can giver her an ETA, if not to contact office again. -JMA

## 2020-12-10 ENCOUNTER — Telehealth: Payer: Self-pay

## 2020-12-10 NOTE — Telephone Encounter (Signed)
   Telephone encounter was:  Successful.  12/10/2020 Name: Karen Powell MRN: [id number] DOB: January 01, 1936  Karen Powell is a 85 y.o. year old female who is a primary care patient of Salomon Fick, Bettey Mare, MD . The community resource team was consulted for assistance with Food Insecurity  Care guide performed the following interventions: Received call from Elijah Birk of [facility] patient's daughter called to cancel meals. Patient will not be placed on [facility] program.  Patient received meals from Feb 10 - March 17.  Follow Up Plan:  No further follow up planned at this time. The patient has been provided with needed resources.  Psalm Schappell, AAS Paralegal, [facility] Care Guide . Embedded Care Coordination [facility] Health  Care Management  [address] [place], [place] [address] ??[email or web address]  ?? [phone]   [email or web address]

## 2020-12-13 ENCOUNTER — Telehealth: Payer: Self-pay | Admitting: Family Medicine

## 2020-12-13 NOTE — Telephone Encounter (Signed)
Patient is calling and requesting a call back regarding a personal matter. Pt is asking the provider to call her back on the grand pad, please advise.

## 2020-12-14 NOTE — Telephone Encounter (Signed)
This message was resolved 

## 2020-12-14 NOTE — Telephone Encounter (Signed)
Called patient, no answer 

## 2020-12-19 NOTE — Telephone Encounter (Signed)
Called patient at the number listed and no answer, no vm to leave a message.

## 2020-12-21 ENCOUNTER — Telehealth (INDEPENDENT_AMBULATORY_CARE_PROVIDER_SITE_OTHER): Payer: Medicare Other | Admitting: Family Medicine

## 2020-12-21 ENCOUNTER — Telehealth: Payer: Self-pay | Admitting: Family Medicine

## 2020-12-21 ENCOUNTER — Encounter: Payer: Self-pay | Admitting: Family Medicine

## 2020-12-21 DIAGNOSIS — E1140 Type 2 diabetes mellitus with diabetic neuropathy, unspecified: Secondary | ICD-10-CM | POA: Diagnosis not present

## 2020-12-21 DIAGNOSIS — F0390 Unspecified dementia without behavioral disturbance: Secondary | ICD-10-CM

## 2020-12-21 DIAGNOSIS — Z794 Long term (current) use of insulin: Secondary | ICD-10-CM | POA: Diagnosis not present

## 2020-12-21 NOTE — Telephone Encounter (Signed)
pt called and is awaiting a call from Dr. Salomon Fick she said she missed a call from the office because she did not answer. Pt insists that Dr banks call her on [facility] number which is [phone]. please call this number only per pt

## 2020-12-21 NOTE — Telephone Encounter (Signed)
Patient has appointment 12/21/20.

## 2020-12-21 NOTE — Progress Notes (Signed)
Virtual Visit via Telephone Note  I connected with [name] on 12/21/20 at  2:00 PM EDT by telephone and verified that I am speaking with the correct person using two identifiers.   I discussed the limitations, risks, security and privacy concerns of performing an evaluation and management service by telephone and the availability of in person appointments. I also discussed with the patient that there may be a patient responsible charge related to this service. The patient expressed understanding and agreed to proceed.  Location patient: home Location provider: work or home office Participants present for the call: patient, provider Patient did not have a visit in the prior 7 days to address this/these issue(s).   History of Present Illness: Pt is a 85 yo female with pmh sig for migraines, dementia, HTN, HLD, GERD, DM II who is seen for f/u.  Pt states her bs has been 252.   Pt states she has been eating a lot of beans, peas, broccoli and green beans.  Pt taking lantus 57 units and novolog 15 units with meals.  Pt no longer drinking juice.  Pt not putting any sugar in her coffee in am and pm.  Pt cancelled meals on wheels as she states it is not for her.   Observations/Objective: Patient sounds cheerful and well on the phone. I do not appreciate any SOB. Speech and thought processing are grossly intact. Patient reported vitals:  Assessment and Plan: Type 2 diabetes mellitus with diabetic neuropathy, with long-term current use of insulin (HCC)  -hgb A1C 8.4% -elevated blood sugars at home -concerns about compliance and risk of hypoglycemia -lifestyle modifications - Plan: insulin glargine (LANTUS SOLOSTAR) 100 UNIT/ML Solostar Pen  Dementia without behavioral disturbance, unspecified dementia type (HCC) -declines medication -discussed the importance of family involvement -frequent f/u appts   Follow Up Instructions: F/u prn in month  I did not refer this patient for an OV in  the next 24 hours for this/these issue(s).  I discussed the assessment and treatment plan with the patient. The patient was provided an opportunity to ask questions and all were answered. The patient agreed with the plan and demonstrated an understanding of the instructions.   The patient was advised to call back or seek an in-person evaluation if the symptoms worsen or if the condition fails to improve as anticipated.  I provided 29 minutes of non-face-to-face time during this encounter.   Deeann Saint, MD

## 2020-12-21 NOTE — Telephone Encounter (Signed)
Noted  

## 2020-12-30 MED ORDER — LANTUS SOLOSTAR 100 UNIT/ML ~~LOC~~ SOPN: 57.0000 [IU] | PEN_INJECTOR | Freq: Every day | SUBCUTANEOUS | 4 refills | Status: DC

## 2021-01-10 ENCOUNTER — Other Ambulatory Visit: Payer: Self-pay | Admitting: Interventional Cardiology

## 2021-01-23 ENCOUNTER — Telehealth: Payer: Self-pay | Admitting: Family Medicine

## 2021-01-23 NOTE — Telephone Encounter (Signed)
Patient is returning the call, please advise. CB is [phone]

## 2021-02-01 ENCOUNTER — Other Ambulatory Visit: Payer: Self-pay | Admitting: Family Medicine

## 2021-02-08 NOTE — Telephone Encounter (Signed)
Called pt back no answer  

## 2021-02-14 ENCOUNTER — Telehealth: Payer: Self-pay | Admitting: Family Medicine

## 2021-02-14 NOTE — Telephone Encounter (Signed)
Pt is calling in stating that she need a call back to her grand pad pt declined to provide the number.  She is looking for a call before 3:00 p,m. If not she stated that she will call back b/c Dr. Salomon Fick knows what she is calling about and would not elaborate on what the matter is at hand.

## 2021-02-15 NOTE — Telephone Encounter (Signed)
Pt called back to check the status of the below msg to Dr. Salomon Fick.  She is aware that it is on the doctors desktop to be reviewed.

## 2021-02-20 ENCOUNTER — Encounter: Payer: Self-pay | Admitting: Family Medicine

## 2021-02-20 ENCOUNTER — Other Ambulatory Visit: Payer: Self-pay

## 2021-02-20 ENCOUNTER — Ambulatory Visit (INDEPENDENT_AMBULATORY_CARE_PROVIDER_SITE_OTHER): Payer: Medicare Other | Admitting: Family Medicine

## 2021-02-20 VITALS — BP 144/96 | HR 87 | Temp 98.6°F | Wt 167.2 lb

## 2021-02-20 DIAGNOSIS — I10 Essential (primary) hypertension: Secondary | ICD-10-CM | POA: Diagnosis not present

## 2021-02-20 DIAGNOSIS — E1140 Type 2 diabetes mellitus with diabetic neuropathy, unspecified: Secondary | ICD-10-CM

## 2021-02-20 DIAGNOSIS — Z794 Long term (current) use of insulin: Secondary | ICD-10-CM

## 2021-02-20 DIAGNOSIS — F0390 Unspecified dementia without behavioral disturbance: Secondary | ICD-10-CM

## 2021-02-20 DIAGNOSIS — E782 Mixed hyperlipidemia: Secondary | ICD-10-CM | POA: Diagnosis not present

## 2021-02-20 LAB — CBC WITH DIFFERENTIAL/PLATELET
Basophils Absolute: 0 10*3/uL (ref 0.0–0.1)
Basophils Relative: 0.6 % (ref 0.0–3.0)
Eosinophils Absolute: 0.1 10*3/uL (ref 0.0–0.7)
Eosinophils Relative: 1.4 % (ref 0.0–5.0)
HCT: 39.1 % (ref 36.0–46.0)
Hemoglobin: 12.9 g/dL (ref 12.0–15.0)
Lymphocytes Relative: 33.6 % (ref 12.0–46.0)
Lymphs Abs: 1.7 10*3/uL (ref 0.7–4.0)
MCHC: 33.1 g/dL (ref 30.0–36.0)
MCV: 88.8 fl (ref 78.0–100.0)
Monocytes Absolute: 0.4 10*3/uL (ref 0.1–1.0)
Monocytes Relative: 7.1 % (ref 3.0–12.0)
Neutro Abs: 2.9 10*3/uL (ref 1.4–7.7)
Neutrophils Relative %: 57.3 % (ref 43.0–77.0)
Platelets: 202 10*3/uL (ref 150.0–400.0)
RBC: 4.4 Mil/uL (ref 3.87–5.11)
RDW: 13.8 % (ref 11.5–15.5)
WBC: 5 10*3/uL (ref 4.0–10.5)

## 2021-02-20 LAB — VITAMIN B12: Vitamin B-12: 845 pg/mL (ref 211–911)

## 2021-02-20 LAB — TSH: TSH: 1.03 u[IU]/mL (ref 0.35–4.50)

## 2021-02-20 LAB — T4, FREE: Free T4: 0.83 ng/dL (ref 0.60–1.60)

## 2021-02-20 LAB — HEMOGLOBIN A1C: Hgb A1c MFr Bld: 9.8 % — ABNORMAL HIGH (ref 4.6–6.5)

## 2021-02-20 NOTE — Progress Notes (Signed)
Subjective:    Patient ID: Karen Powell, female    DOB: Jan 24, 1936, 85 y.o.   MRN: [id number]  Chief Complaint  Patient presents with  . Follow-up    HPI Patient was seen today for f/u on chronic conditions.  Pt upset her medical supply company will no longer be providing glucose sensors.  Per the letter sent to her, [employer] will be aligning services with [employer].  The letter states this will happen automatically unless the pt contacts them by 02/13/21.  Pt states she did not receive the letter until after this date and does not want a new company.    FSBS at home are are up and down, range 62-246.  Pt stopped checking fsbs 5/18 until now.  Currently taking lantus 57 units and novolog 15 units with meals.  Pt requesting BD 32G x 1/4" pen needles when she needs refills.    Pt with h/o dementia, unaccompanied this visit.  Becomes upset with mention of her daughter being involved with care at visits.  Past Medical History:  Diagnosis Date  . Arthritis   . Blood transfusion without reported diagnosis   . Depression   . Diabetic neuropathy (HCC)   . Dizziness   . Frequent headaches   . Glaucoma   . Hyperlipidemia   . Hypertension   . Migraines   . Palpitations   . Urinary incontinence     Allergies  Allergen Reactions  . Penicillins Anaphylaxis    ROS General: Denies fever, chills, night sweats, changes in weight, changes in appetite HEENT: Denies headaches, ear pain, changes in vision, rhinorrhea, sore throat CV: Denies CP, palpitations, SOB, orthopnea Pulm: Denies SOB, cough, wheezing GI: Denies abdominal pain, nausea, vomiting, diarrhea, constipation GU: Denies dysuria, hematuria, frequency, vaginal discharge Msk: Denies muscle cramps, joint pains Neuro: Denies weakness, numbness, tingling Skin: Denies rashes, bruising Psych: Denies depression, anxiety, hallucinations     Objective:    Blood pressure (!) 144/96, pulse 87,  temperature 98.6 F (37 C), temperature source Oral, weight 167 lb 3.2 oz (75.8 kg), SpO2 97 %.  Gen. Pleasant, well-nourished, in no distress, normal affect   HEENT: [place]/AT, face symmetric, conjunctiva clear, no scleral icterus, PERRLA, EOMI, nares patent without drainage Lungs: no accessory muscle use, CTAB, no wheezes or rales Cardiovascular: RRR, no m/r/g, no peripheral edema Musculoskeletal: No deformities, no cyanosis or clubbing, normal tone Neuro:  A&Ox3, CN II-XII intact, normal gait Skin:  Warm, no lesions/ rash   Wt Readings from Last 3 Encounters:  02/20/21 167 lb 3.2 oz (75.8 kg)  11/21/20 170 lb 9.6 oz (77.4 kg)  10/24/20 169 lb (76.7 kg)    Lab Results  Component Value Date   WBC 6.3 07/11/2020   HGB 12.8 07/11/2020   HCT 38.2 07/11/2020   PLT 217 07/11/2020   GLUCOSE 175 (H) 07/11/2020   ALT 17 07/11/2020   AST 21 07/11/2020   NA 137 07/11/2020   K 4.4 07/11/2020   CL 101 07/11/2020   CREATININE 0.79 07/11/2020   BUN 16 07/11/2020   CO2 30 07/11/2020   TSH 1.05 07/11/2020   HGBA1C 8.4 (A) 08/22/2020    Assessment/Plan:  Type 2 diabetes mellitus with diabetic neuropathy, with long-term current use of insulin (HCC)  -hgb A1C 8.4% on 08/22/20 -discussed the importance of pt eating regular meals. Set up meals on wheels, however pt cancelled the service. -given liable fsbs hesitant to make insulin adjustments.  Attempted to stress the  importance of family involvement - Plan: Hemoglobin A1c, Ambulatory referral to Ophthalmology  Dementia without behavioral disturbance, unspecified dementia type [facility]) -stressed the importance of family involvement in pt's care -declines meds -will continue close monthly f/u.  - Plan: TSH, T4, Free, CBC with Differential/Platelet, Vitamin B12  Essential hypertension -elevated as pt did not take meds this am 2/2 coming to appt. -advised to take meds consistently as previously well controlled. -lifestyle modifications  including decreasing sodium intake encouraged. -continue losartan-HCTZ 100-25 mg daily, Toprol XL 25 mg  - Plan: CMP  Mixed hyperlipidemia  -continue crestor 20 mg qhs -lifestyle modifications - Plan: Lipid panel  F/u in 1 month  Abbe Amsterdam, MD

## 2021-02-20 NOTE — Patient Instructions (Addendum)
A referral was placed at this visit for you to see the ophthalmologist for diabetic retinopathy screening.  You should expect a call about making this appointment from their office.  It is important that you make sure you are eating regular meals and checking your blood sugar to prevent hypoglycemia (low blood sugar).   Managing Your Hypertension Hypertension, also called high blood pressure, is when the force of the blood pressing against the walls of the arteries is too strong. Arteries are blood vessels that carry blood from your heart throughout your body. Hypertension forces the heart to work harder to pump blood and may cause the arteries to become narrow or stiff. Understanding blood pressure readings Your personal target blood pressure may vary depending on your medical conditions, your age, and other factors. A blood pressure reading includes a higher number over a lower number. Ideally, your blood pressure should be below 120/80. You should know that:  The first, or top, number is called the systolic pressure. It is a measure of the pressure in your arteries as your heart beats.  The second, or bottom number, is called the diastolic pressure. It is a measure of the pressure in your arteries as the heart relaxes. Blood pressure is classified into four stages. Based on your blood pressure reading, your health care provider may use the following stages to determine what type of treatment you need, if any. Systolic pressure and diastolic pressure are measured in a unit called mmHg. Normal  Systolic pressure: below 120.  Diastolic pressure: below 80. Elevated  Systolic pressure: 120-129.  Diastolic pressure: below 80. Hypertension stage 1  Systolic pressure: 130-139.  Diastolic pressure: 80-89. Hypertension stage 2  Systolic pressure: 140 or above.  Diastolic pressure: 90 or above. How can this condition affect me? Managing your hypertension is an important responsibility.  Over time, hypertension can damage the arteries and decrease blood flow to important parts of the body, including the brain, heart, and kidneys. Having untreated or uncontrolled hypertension can lead to:  A heart attack.  A stroke.  A weakened blood vessel (aneurysm).  Heart failure.  Kidney damage.  Eye damage.  Metabolic syndrome.  Memory and concentration problems.  Vascular dementia. What actions can I take to manage this condition? Hypertension can be managed by making lifestyle changes and possibly by taking medicines. Your health care provider will help you make a plan to bring your blood pressure within a normal range. Nutrition  Eat a diet that is high in fiber and potassium, and low in salt (sodium), added sugar, and fat. An example eating plan is called the Dietary Approaches to Stop Hypertension (DASH) diet. To eat this way: ? Eat plenty of fresh fruits and vegetables. Try to fill one-half of your plate at each meal with fruits and vegetables. ? Eat whole grains, such as whole-wheat pasta, brown rice, or whole-grain bread. Fill about one-fourth of your plate with whole grains. ? Eat low-fat dairy products. ? Avoid fatty cuts of meat, processed or cured meats, and poultry with skin. Fill about one-fourth of your plate with lean proteins such as fish, chicken without skin, beans, eggs, and tofu. ? Avoid pre-made and processed foods. These tend to be higher in sodium, added sugar, and fat.  Reduce your daily sodium intake. Most people with hypertension should eat less than 1,500 mg of sodium a day.   Lifestyle  Work with your health care provider to maintain a healthy body weight or to lose weight. Ask what  an ideal weight is for you.  Get at least 30 minutes of exercise that causes your heart to beat faster (aerobic exercise) most days of the week. Activities may include walking, swimming, or biking.  Include exercise to strengthen your muscles (resistance exercise),  such as weight lifting, as part of your weekly exercise routine. Try to do these types of exercises for 30 minutes at least 3 days a week.  Do not use any products that contain nicotine or tobacco, such as cigarettes, e-cigarettes, and chewing tobacco. If you need help quitting, ask your health care provider.  Control any long-term (chronic) conditions you have, such as high cholesterol or diabetes.  Identify your sources of stress and find ways to manage stress. This may include meditation, deep breathing, or making time for fun activities.   Alcohol use  Do not drink alcohol if: ? Your health care provider tells you not to drink. ? You are pregnant, may be pregnant, or are planning to become pregnant.  If you drink alcohol: ? Limit how much you use to:  0-1 drink a day for women.  0-2 drinks a day for men. ? Be aware of how much alcohol is in your drink. In the [place] one drink equals one 12 oz bottle of beer (355 mL), one 5 oz glass of wine (148 mL), or one 1 oz glass of hard liquor (44 mL). Medicines Your health care provider may prescribe medicine if lifestyle changes are not enough to get your blood pressure under control and if:  Your systolic blood pressure is 130 or higher.  Your diastolic blood pressure is 80 or higher. Take medicines only as told by your health care provider. Follow the directions carefully. Blood pressure medicines must be taken as told by your health care provider. The medicine does not work as well when you skip doses. Skipping doses also puts you at risk for problems. Monitoring Before you monitor your blood pressure:  Do not smoke, drink caffeinated beverages, or exercise within 30 minutes before taking a measurement.  Use the bathroom and empty your bladder (urinate).  Sit quietly for at least 5 minutes before taking measurements. Monitor your blood pressure at home as told by your health care provider. To do this:  Sit with your back straight  and supported.  Place your feet flat on the floor. Do not cross your legs.  Support your arm on a flat surface, such as a table. Make sure your upper arm is at heart level.  Each time you measure, take two or three readings one minute apart and record the results. You may also need to have your blood pressure checked regularly by your health care provider.   General information  Talk with your health care provider about your diet, exercise habits, and other lifestyle factors that may be contributing to hypertension.  Review all the medicines you take with your health care provider because there may be side effects or interactions.  Keep all visits as told by your health care provider. Your health care provider can help you create and adjust your plan for managing your high blood pressure. Where to find more information  National Heart, Lung, and Blood Institute: PopSteam.is  American Heart Association: www.heart.org Contact a health care provider if:  You think you are having a reaction to medicines you have taken.  You have repeated (recurrent) headaches.  You feel dizzy.  You have swelling in your ankles.  You have trouble with your vision. Get  help right away if:  You develop a severe headache or confusion.  You have unusual weakness or numbness, or you feel faint.  You have severe pain in your chest or abdomen.  You vomit repeatedly.  You have trouble breathing. These symptoms may represent a serious problem that is an emergency. Do not wait to see if the symptoms will go away. Get medical help right away. Call your local emergency services (911 in the [place] Do not drive yourself to the hospital. Summary  Hypertension is when the force of blood pumping through your arteries is too strong. If this condition is not controlled, it may put you at risk for serious complications.  Your personal target blood pressure may vary depending on your medical conditions,  your age, and other factors. For most people, a normal blood pressure is less than 120/80.  Hypertension is managed by lifestyle changes, medicines, or both.  Lifestyle changes to help manage hypertension include losing weight, eating a healthy, low-sodium diet, exercising more, stopping smoking, and limiting alcohol. This information is not intended to replace advice given to you by your health care provider. Make sure you discuss any questions you have with your health care provider. Document Revised: 10/14/2019 Document Reviewed: 08/09/2019 Elsevier Patient Education  2021 Elsevier Inc.  Diabetes Mellitus and Standards of Medical Care Living with and managing diabetes (diabetes mellitus) can be complicated. Your diabetes treatment may be managed by a team of health care providers, including:  A physician who specializes in diabetes (endocrinologist). You might also have visits with a nurse practitioner or physician assistant.  Nurses.  A registered dietitian.  A certified diabetes care and education specialist.  An exercise specialist.  A pharmacist.  An eye doctor.  A foot specialist (podiatrist).  A dental care provider.  A primary care provider.  A mental health care provider. How to manage your diabetes You can do many things to successfully manage your diabetes. Your health care providers will follow guidelines to help you get the best quality of care. Here are general guidelines for your diabetes management plan. Your health care providers may give you more specific instructions. Physical exams When you are diagnosed with diabetes, and each year after that, your health care provider will ask about your medical and family history. You will have a physical exam, which may include:  Measuring your height, weight, and body mass index (BMI).  Checking your blood pressure. This will be done at every routine medical visit. Your target blood pressure may vary depending on  your medical conditions, your age, and other factors.  A thyroid exam.  A skin exam.  Screening for nerve damage (peripheral neuropathy). This may include checking the pulse in your legs and feet and the level of sensation in your hands and feet.  A foot exam to inspect the structure and skin of your feet, including checking for cuts, bruises, redness, blisters, sores, or other problems.  Screening for blood vessel (vascular) problems. This may include checking the pulse in your legs and feet and checking your temperature. Blood tests Depending on your treatment plan and your personal needs, you may have the following tests:  Hemoglobin A1C (HbA1C). This test provides information about blood sugar (glucose) control over the previous 2-3 months. It is used to adjust your treatment plan, if needed. This test will be done: ? At least 2 times a year, if you are meeting your treatment goals. ? 4 times a year, if you are not meeting your  treatment goals or if your goals have changed.  Lipid testing, including total cholesterol, LDL and HDL cholesterol, and triglyceride levels. ? The goal for LDL is less than 100 mg/dL (5.5 mmol/L). If you are at high risk for complications, the goal is less than 70 mg/dL (3.9 mmol/L). ? The goal for HDL is 40 mg/dL (2.2 mmol/L) or higher for men, and 50 mg/dL (2.8 mmol/L) or higher for women. An HDL cholesterol of 60 mg/dL (3.3 mmol/L) or higher gives some protection against heart disease. ? The goal for triglycerides is less than 150 mg/dL (8.3 mmol/L).  Liver function tests.  Kidney function tests.  Thyroid function tests.   Dental and eye exams  Visit your dentist two times a year.  If you have type 1 diabetes, your health care provider may recommend an eye exam within 5 years after you are diagnosed, and then once a year after your first exam. ? For children with type 1 diabetes, the health care provider may recommend an eye exam when your child is age  74 or older and has had diabetes for 3-5 years. After the first exam, your child should get an eye exam once a year.  If you have type 2 diabetes, your health care provider may recommend an eye exam as soon as you are diagnosed, and then every 1-2 years after your first exam.   Immunizations  A yearly flu (influenza) vaccine is recommended annually for everyone 6 months or older. This is especially important if you have diabetes.  The pneumonia (pneumococcal) vaccine is recommended for everyone 2 years or older who has diabetes. If you are age 73 or older, you may get the pneumonia vaccine as a series of two separate shots.  The hepatitis B vaccine is recommended for adults shortly after being diagnosed with diabetes. Adults and children with diabetes should receive all other vaccines according to age-specific recommendations from the Centers for Disease Control and Prevention (CDC). Mental and emotional health Screening for symptoms of eating disorders, anxiety, and depression is recommended at the time of diagnosis and after as needed. If your screening shows that you have symptoms, you may need more evaluation. You may work with a mental health care provider. Follow these instructions at home: Treatment plan You will monitor your blood glucose levels and may give yourself insulin. Your treatment plan will be reviewed at every medical visit. You and your health care provider will discuss:  How you are taking your medicines, including insulin.  Any side effects you have.  Your blood glucose level target goals.  How often you monitor your blood glucose level.  Lifestyle habits, such as activity level and tobacco, alcohol, and substance use. Education Your health care provider will assess how well you are monitoring your blood glucose levels and whether you are taking your insulin and medicines correctly. He or she may refer you to:  A certified diabetes care and education specialist to  manage your diabetes throughout your life, starting at diagnosis.  A registered dietitian who can create and review your personal nutrition plan.  An exercise specialist who can discuss your activity level and exercise plan. General instructions  Take over-the-counter and prescription medicines only as told by your health care provider.  Keep all follow-up visits. This is important. Where to find support There are many diabetes support networks, including:  American Diabetes Association (ADA): diabetes.org  Defeat Diabetes Foundation: defeatdiabetes.org Where to find more information  American Diabetes Association (ADA): www.diabetes.org  Association  of Diabetes Care & Education Specialists (ADCES): diabeteseducator.org  International Diabetes Federation (IDF): http://hill.biz/idf.org Summary  Managing diabetes (diabetes mellitus) can be complicated. Your diabetes treatment may be managed by a team of health care providers.  Your health care providers follow guidelines to help you get the best quality care.  You should have physical exams, blood tests, blood pressure monitoring, immunizations, and screening tests regularly. Stay updated on how to manage your diabetes.  Your health care providers may also give you more specific instructions based on your individual health. This information is not intended to replace advice given to you by your health care provider. Make sure you discuss any questions you have with your health care provider. Document Revised: 03/15/2020 Document Reviewed: 03/15/2020 Elsevier Patient Education  2021 [employer]

## 2021-02-21 NOTE — Telephone Encounter (Signed)
Patient had office visit 02/20/21.

## 2021-02-22 LAB — COMPREHENSIVE METABOLIC PANEL
ALT: 16 U/L (ref 0–35)
AST: 20 U/L (ref 0–37)
Albumin: 4.4 g/dL (ref 3.5–5.2)
Alkaline Phosphatase: 46 U/L (ref 39–117)
BUN: 26 mg/dL — ABNORMAL HIGH (ref 6–23)
CO2: 19 mEq/L (ref 19–32)
Calcium: 11.3 mg/dL — ABNORMAL HIGH (ref 8.4–10.5)
Chloride: 103 mEq/L (ref 96–112)
Creatinine, Ser: 0.99 mg/dL (ref 0.40–1.20)
GFR: 53.04 mL/min — ABNORMAL LOW (ref >60.00)
Glucose, Bld: 433 mg/dL — ABNORMAL HIGH (ref 70–99)
Potassium: 4.3 mEq/L (ref 3.5–5.1)
Sodium: 141 mEq/L (ref 135–145)
Total Bilirubin: 0.7 mg/dL (ref 0.2–1.2)
Total Protein: 7.3 g/dL (ref 6.0–8.3)

## 2021-02-22 LAB — LIPID PANEL
Cholesterol: 154 mg/dL (ref 0–200)
HDL: 47 mg/dL (ref >39.00)
LDL Cholesterol: 82 mg/dL (ref 0–99)
NonHDL: 107.26
Total CHOL/HDL Ratio: 3
Triglycerides: 126 mg/dL (ref 0.0–149.0)
VLDL: 25.2 mg/dL (ref 0.0–40.0)

## 2021-02-26 ENCOUNTER — Encounter: Payer: Self-pay | Admitting: Family Medicine

## 2021-02-27 MED ORDER — FREESTYLE LIBRE 2 SENSOR MISC: 3 refills | Status: DC

## 2021-02-27 NOTE — Addendum Note (Signed)
Addended by: Elwin Mocha on: 02/27/2021 05:07 PM   Modules accepted: Orders

## 2021-02-27 NOTE — Progress Notes (Signed)
Spoke with patient, is aware. Needed, sensors for her freestyle libre, sent in to pharmacy.

## 2021-03-02 ENCOUNTER — Other Ambulatory Visit: Payer: Self-pay | Admitting: Family Medicine

## 2021-03-20 ENCOUNTER — Other Ambulatory Visit: Payer: Self-pay | Admitting: Family Medicine

## 2021-04-12 ENCOUNTER — Other Ambulatory Visit: Payer: Self-pay

## 2021-04-12 NOTE — Telephone Encounter (Signed)
Rx request for Karen Powell requesting additional information was faxed and waiting on a response.

## 2021-04-13 ENCOUNTER — Other Ambulatory Visit: Payer: Self-pay | Admitting: Interventional Cardiology

## 2021-04-18 ENCOUNTER — Telehealth: Payer: Self-pay | Admitting: Family Medicine

## 2021-04-18 NOTE — Telephone Encounter (Signed)
Completed form and LOV notes faxed -7/28

## 2021-04-18 NOTE — Telephone Encounter (Signed)
[name] w/[facility] is calling in stating that they have called several time (04/02/2021 and 04/12/21) left msg to see if they could get a new prescription faxed to them for Rx FreeStyle Karen Powell  it is to be faxed to [phone]

## 2021-04-25 ENCOUNTER — Telehealth: Payer: Self-pay

## 2021-04-25 NOTE — Telephone Encounter (Signed)
Representative calling requesting paperwork for Rx changed.  Informed them Dr. Salomon Fick Is out of the office until 8/8.

## 2021-04-26 NOTE — Telephone Encounter (Signed)
Noted  

## 2021-04-29 ENCOUNTER — Telehealth: Payer: Self-pay | Admitting: Family Medicine

## 2021-04-29 NOTE — Telephone Encounter (Signed)
Pt call and stated she received a 30 day supply of her medication and it should have been for 90 day supply .medication is TOPROL XL 25 MG 24 hr tablet pt stated she need it fix.

## 2021-04-30 NOTE — Telephone Encounter (Signed)
Pt informed to contact Dr. Katrinka Blazing with Cardiology in regards to RX as Dr. Katrinka Blazing is the prescribing provider.

## 2021-04-30 NOTE — Telephone Encounter (Signed)
Representative called to follow up

## 2021-05-01 ENCOUNTER — Telehealth: Payer: Self-pay | Admitting: Family Medicine

## 2021-05-01 NOTE — Telephone Encounter (Signed)
Megan Salon from [facility] is requesting an updated prescription stating insulin be taken 4 times a day be sent to them via fax.  Fax number is [phone]  Any questions call [phone]

## 2021-05-02 ENCOUNTER — Ambulatory Visit: Payer: Medicare Other | Admitting: Family Medicine

## 2021-05-03 ENCOUNTER — Telehealth: Payer: Self-pay | Admitting: Family Medicine

## 2021-05-03 NOTE — Telephone Encounter (Signed)
[facility] needs a correction on the Continuous Blood Gluc Sensor (FREESTYLE LIBRE 2 SENSOR) MISC. The info they got on the clinical notes and the prescription don't match up and they need a correct one with a initial and date by the Dr on it.

## 2021-05-08 ENCOUNTER — Other Ambulatory Visit: Payer: Self-pay | Admitting: Family Medicine

## 2021-05-08 ENCOUNTER — Telehealth: Payer: Self-pay

## 2021-05-08 ENCOUNTER — Other Ambulatory Visit: Payer: Self-pay

## 2021-05-08 DIAGNOSIS — E1140 Type 2 diabetes mellitus with diabetic neuropathy, unspecified: Secondary | ICD-10-CM

## 2021-05-08 DIAGNOSIS — Z794 Long term (current) use of insulin: Secondary | ICD-10-CM

## 2021-05-08 NOTE — Telephone Encounter (Signed)
Pt called requesting medict

## 2021-05-08 NOTE — Telephone Encounter (Signed)
Pt message sent to Dr Salomon Fick [facility]

## 2021-05-09 ENCOUNTER — Ambulatory Visit (INDEPENDENT_AMBULATORY_CARE_PROVIDER_SITE_OTHER): Payer: Medicare Other | Admitting: Family Medicine

## 2021-05-09 ENCOUNTER — Encounter: Payer: Self-pay | Admitting: Family Medicine

## 2021-05-09 VITALS — BP 132/84 | HR 78 | Temp 98.8°F | Wt 170.0 lb

## 2021-05-09 DIAGNOSIS — Z794 Long term (current) use of insulin: Secondary | ICD-10-CM

## 2021-05-09 DIAGNOSIS — F0390 Unspecified dementia without behavioral disturbance: Secondary | ICD-10-CM | POA: Diagnosis not present

## 2021-05-09 DIAGNOSIS — I10 Essential (primary) hypertension: Secondary | ICD-10-CM

## 2021-05-09 DIAGNOSIS — N8110 Cystocele, unspecified: Secondary | ICD-10-CM | POA: Diagnosis not present

## 2021-05-09 DIAGNOSIS — N816 Rectocele: Secondary | ICD-10-CM | POA: Diagnosis not present

## 2021-05-09 DIAGNOSIS — E1140 Type 2 diabetes mellitus with diabetic neuropathy, unspecified: Secondary | ICD-10-CM | POA: Diagnosis not present

## 2021-05-09 LAB — POCT URINALYSIS DIPSTICK
Bilirubin, UA: NEGATIVE
Blood, UA: NEGATIVE
Glucose, UA: NEGATIVE
Ketones, UA: NEGATIVE
Leukocytes, UA: NEGATIVE
Nitrite, UA: NEGATIVE
Protein, UA: NEGATIVE
Spec Grav, UA: 1.025 (ref 1.010–1.025)
Urobilinogen, UA: NEGATIVE E.U./dL — AB
pH, UA: 6 (ref 5.0–8.0)

## 2021-05-09 NOTE — Patient Instructions (Addendum)
A referral has been placed for you to see the Gynecologist again.  In the past you saw one for your pessary.  They will try to call you to set up this appointment.  You should strongly consider starting medication for your mood and memory.  Also consider counseling.  Your blood sugar continues to be high.  You should work on eating less bread, rice, pasta, other carbohydrates, and sweets.  Please call Dr. Michaelle Copas office and set up your regular follow up appointment.

## 2021-05-09 NOTE — Progress Notes (Signed)
Subjective:    Patient ID: Karen Powell, female    DOB: 06-23-1936, 85 y.o.   MRN: [id number]  Chief Complaint  Patient presents with   Follow-up    DM    HPI Patient was seen today for DM, HTN, memory. Pt states she has been doing a lot of thinking.  Notes change in her memory which is frustrating.  Feeling down some as not able to get out and go like she used to in [place] or [place].  BS 160s-300.  Having vaginal issues but doesn't like discussing.  Inquires if she needs PCN.  Past Medical History:  Diagnosis Date   Arthritis    Blood transfusion without reported diagnosis    Depression    Diabetic neuropathy (HCC)    Dizziness    Frequent headaches    Glaucoma    Hyperlipidemia    Hypertension    Migraines    Palpitations    Urinary incontinence     Allergies  Allergen Reactions   Penicillins Anaphylaxis    ROS General: Denies fever, chills, night sweats, changes in weight, changes in appetite  +dementia HEENT: Denies headaches, ear pain, changes in vision, rhinorrhea, sore throat CV: Denies CP, palpitations, SOB, orthopnea Pulm: Denies SOB, cough, wheezing GI: Denies abdominal pain, nausea, vomiting, diarrhea, constipation GU: Denies dysuria, hematuria, frequency +vaginal irritation, h/o cystocele Msk: Denies muscle cramps, joint pains Neuro: Denies weakness, numbness, tingling Skin: Denies rashes, bruising Psych: Denies depression, anxiety, hallucinations     Objective:    Blood pressure 132/84, pulse 78, temperature 98.8 F (37.1 C), temperature source Oral, weight 170 lb (77.1 kg), SpO2 99 %.  Gen. Pleasant, well-nourished, in no distress, normal affect   HEENT: [place]/AT, face symmetric, conjunctiva clear, no scleral icterus, PERRLA, EOMI, nares patent without drainage Lungs: no accessory muscle use, CTAB, no wheezes or rales Cardiovascular: RRR, no m/r/g, no peripheral edema Musculoskeletal: No deformities, no cyanosis or clubbing, normal tone Neuro:  A&Ox3,  CN II-XII intact, normal gait Skin:  Warm, intact.  Dry skin on b/l UEs  Depression screen [facility] 2/9 05/09/2021 10/24/2020 07/24/2020  Decreased Interest 2 2 0  Down, Depressed, Hopeless 2 1 1   PHQ - 2 Score 4 3 1   Altered sleeping - 1 0  Tired, decreased energy 2 1 0  Change in appetite 2 2 0  Feeling bad or failure about yourself  2 1 0  Trouble concentrating 0 1 0  Moving slowly or fidgety/restless 0 2 0  Suicidal thoughts 0 0 0  PHQ-9 Score - 11 1  Difficult doing work/chores - Somewhat difficult Not difficult at all   GAD 7 : Generalized Anxiety Score 05/09/2021 11/10/2020  Nervous, Anxious, on Edge 1 1  Control/stop worrying 1 2  Worry too much - different things 1 1  Trouble relaxing 1 1  Restless 1 1  Easily annoyed or irritable 1 2  Afraid - awful might happen 1 1  Total GAD 7 Score 7 9  Anxiety Difficulty - Somewhat difficult      Wt Readings from Last 3 Encounters:  05/09/21 170 lb (77.1 kg)  02/20/21 167 lb 3.2 oz (75.8 kg)  11/21/20 170 lb 9.6 oz (77.4 kg)    Lab Results  Component Value Date   WBC 5.0 02/20/2021   HGB 12.9 02/20/2021   HCT 39.1 02/20/2021   PLT 202.0 02/20/2021   GLUCOSE 433 (H) 02/20/2021   CHOL 154 02/20/2021   TRIG 126.0 02/20/2021   HDL 47.00  02/20/2021   LDLCALC 82 02/20/2021   ALT 16 02/20/2021   AST 20 02/20/2021   NA 141 02/20/2021   K 4.3 02/20/2021   CL 103 02/20/2021   CREATININE 0.99 02/20/2021   BUN 26 (H) 02/20/2021   CO2 19 02/20/2021   TSH 1.03 02/20/2021   HGBA1C 9.8 (H) 02/20/2021    Assessment/Plan:  Essential hypertension -Currently controlled -Lifestyle modifications -Continue Toprol-XL 50 mg in a.m. and 25 mg in p.m. as prescribed by cardiology -Also continue losartan-hydrochlorothiazide 100-25 mg daily -Encouraged to schedule follow-up appointment with cardiology, Dr. Katrinka Blazing  Type 2 diabetes mellitus with diabetic neuropathy, with long-term current use of insulin (HCC) -Uncontrolled -Hemoglobin A1c 9.8%  on 02/20/2021 -Discussed the importance of lifestyle modifications -Hesitant to make insulin changes 2/2 dementia -Continue current medications -On ARB and statin -Continue close follow-up  Dementia without behavioral disturbance, unspecified dementia type (HCC) -PHQ 9 score 10 though did not answer sleep question -GAD 7 score 7 -Counseling and neuropsych testing advised -Continue close follow-up  Cystocele with rectocele  -UA negative - Plan: Ambulatory referral to Gynecology, POCT urinalysis dipstick  F/u in 1-2 months  Abbe Amsterdam, MD

## 2021-05-22 ENCOUNTER — Other Ambulatory Visit: Payer: Self-pay | Admitting: Interventional Cardiology

## 2021-06-10 NOTE — Telephone Encounter (Signed)
Papers completed and faxed in several times.

## 2021-06-15 ENCOUNTER — Other Ambulatory Visit: Payer: Self-pay | Admitting: Interventional Cardiology

## 2021-06-19 ENCOUNTER — Other Ambulatory Visit: Payer: Self-pay | Admitting: Family Medicine

## 2021-06-20 ENCOUNTER — Ambulatory Visit (HOSPITAL_BASED_OUTPATIENT_CLINIC_OR_DEPARTMENT_OTHER): Payer: Medicare Other | Admitting: Family

## 2021-06-20 ENCOUNTER — Other Ambulatory Visit: Payer: Self-pay | Admitting: Interventional Cardiology

## 2021-06-20 NOTE — Progress Notes (Deleted)
   Office Visit    Patient Name: Karen Powell Date of Encounter: 06/20/2021  PCP:  Deeann Saint, MD   [facility]  Cardiologist:  Lesleigh Noe, MD  Advanced Practice Provider:  No care team member to display Electrophysiologist:  None      Chief Complaint    Karen Powell is a 85 y.o. female with a hx of hypertension, palpitations, DM2, hyperlipidemia presents today for annual hypertension follow-up  Past Medical History    Past Medical History:  Diagnosis Date   Arthritis    Blood transfusion without reported diagnosis    Depression    Diabetic neuropathy (HCC)    Dizziness    Frequent headaches    Glaucoma    Hyperlipidemia    Hypertension    Migraines    Palpitations    Urinary incontinence    Past Surgical History:  Procedure Laterality Date   none      Allergies  Allergies  Allergen Reactions   Penicillins Anaphylaxis    History of Present Illness    Karen Powell is a 85 y.o. female with a hx of hypertension, palpitations, DM2, hyperlipidemia last seen 01/03/2020 by Dr. Katrinka Blazing.  She was last seen 01/20/2019 with blood pressure 90.  She was noted to have high fidelity natural therapy and taking many supplements.  She had to HCTZ that day.  She also endorsed following a high salt diet.  Conversation was noted to be tangential and was removed for further evaluation.  She presents today for follow-up.   EKGs/Labs/Other Studies Reviewed:   The following studies were reviewed today: ***  EKG:  EKG is  ordered today.  The ekg ordered today demonstrates ***  Recent Labs: 02/20/2021: ALT 16; BUN 26; Creatinine, Ser 0.99; Hemoglobin 12.9; Platelets 202.0; Potassium 4.3; Sodium 141; TSH 1.03  Recent Lipid Panel    Component Value Date/Time   CHOL 154 02/20/2021 1222   TRIG 126.0 02/20/2021 1222   HDL 47.00 02/20/2021 1222   CHOLHDL 3 02/20/2021 1222   VLDL 25.2 02/20/2021 1222   LDLCALC 82 02/20/2021 1222    Home  Medications   No outpatient medications have been marked as taking for the 06/20/21 encounter (Appointment) with Alver Sorrow, NP.     Review of Systems      All other systems reviewed and are otherwise negative except as noted above.  Physical Exam    VS:  There were no vitals taken for this visit. , BMI There is no height or weight on file to calculate BMI.  Wt Readings from Last 3 Encounters:  05/09/21 170 lb (77.1 kg)  02/20/21 167 lb 3.2 oz (75.8 kg)  11/21/20 170 lb 9.6 oz (77.4 kg)     GEN: Well nourished, well developed, in no acute distress. HEENT: normal. Neck: Supple, no JVD, carotid bruits, or masses. Cardiac: ***RRR, no murmurs, rubs, or gallops. No clubbing, cyanosis, edema.  ***Radials/PT 2+ and equal bilaterally.  Respiratory:  ***Respirations regular and unlabored, clear to auscultation bilaterally. GI: Soft, nontender, nondistended. MS: No deformity or atrophy. Skin: Warm and dry, no rash. Neuro:  Strength and sensation are intact. Psych: Normal affect.  Assessment & Plan    ***  Disposition: Follow up {follow up:15908} with Dr. Katrinka Blazing or APP.  Signed, Alver Sorrow, NP 06/20/2021, 11:28 AM [facility]

## 2021-06-22 NOTE — Progress Notes (Signed)
Follow-up Cardiology Office Note:    Date:  06/25/2021   ID:  [name], DOB 1935/10/06, MRN [id number]  PCP:  Deeann Saint, MD  Cardiologist:  Lesleigh Noe, MD   Referring MD: Deeann Saint, MD   Chief Complaint  Patient presents with   Hypertension     History of Present Illness:    Karen Powell is a 85 y.o. female with a hx of primary hypertension, hyperlipidemia, DM II, and palpitations.  To start, the patient loudly blamed me for not continuing her beta-blocker.  She was given a 1 month supply and told to make an office appointment because the last official refill was done greater than a year earlier.  The appointment that was scheduled to authorize a new prescription was missed by the patient, she says because she gave that appointment to her daughter.  She then states that we could not get her back in the office to see me until now.  She did have an earlier appointment scheduled to see one of our providers at [facility], however after she arrived she decided she did not want to be seen by anyone but myself.  Past Medical History:  Diagnosis Date   Arthritis    Blood transfusion without reported diagnosis    Depression    Diabetic neuropathy (HCC)    Dizziness    Frequent headaches    Glaucoma    Hyperlipidemia    Hypertension    Migraines    Palpitations    Urinary incontinence     Past Surgical History:  Procedure Laterality Date   none      Current Medications: Current Meds  Medication Sig   Alcohol Swabs PADS 1 each by Does not apply route 3 (three) times daily. Used for checking Blood glucose at least 3 times daily   aspirin EC 81 MG tablet Take 1 tablet (81 mg total) by mouth daily.   BD PEN NEEDLE MICRO U/F 32G X 6 MM MISC USE TO ADMINISTER INSULIN THREE TIMES DAILY   Coenzyme Q10 (COQ10) 200 MG CAPS Take 1 capsule by mouth daily.   Continuous Blood Gluc Sensor (FREESTYLE LIBRE 2 SENSOR) MISC Use to check blood sugars.   esomeprazole  (NEXIUM) 20 MG capsule Take 20 mg by mouth daily at 12 noon.   insulin glargine (LANTUS SOLOSTAR) 100 UNIT/ML Solostar Pen Inject 57 Units into the skin at bedtime.   losartan-hydrochlorothiazide (HYZAAR) 100-25 MG tablet Take 1 tablet by mouth daily.   metFORMIN (GLUCOPHAGE) 500 MG tablet TAKE 1 TABLET BY MOUTH TWICE A DAY   Multiple Vitamins-Minerals (CENTRUM PO) Take 1 tablet by mouth daily.   Naproxen Sodium (ALEVE PO) Take 2 tablets by mouth daily as needed (pain).    NOVOLOG FLEXPEN 100 UNIT/ML FlexPen INJECT 15 UNITS INTO THE SKIN 3 (THREE) TIMES DAILY WITH MEALS.   Omega-3 Fatty Acids (OMEGA 3 PO) Take 1 capsule by mouth daily.   Polyethyl Glycol-Propyl Glycol (SYSTANE ULTRA OP) Apply 1 drop to eye daily.   Probiotic Product (PROBIOTIC PO) Take 1 capsule by mouth at bedtime.   rosuvastatin (CRESTOR) 20 MG tablet TAKE 1 TABLET BY MOUTH EVERYDAY AT BEDTIME   Turmeric 500 MG TABS Take 1 tablet by mouth daily.   [DISCONTINUED] TOPROL XL 25 MG 24 hr tablet TAKE 2 TABS BY MOUTH EACH MORNING AND 1 TAB EACH EVENING. PLEASE KEEP UPCOMING APPT     Allergies:   Penicillins   Social History   Socioeconomic History  Marital status: Single    Spouse name: Not on file   Number of children: 4   Years of education: 92   Highest education level: Not on file  Occupational History   Occupation: retired  Tobacco Use   Smoking status: Never   Smokeless tobacco: Never  Vaping Use   Vaping Use: Never used  Substance and Sexual Activity   Alcohol use: Yes   Drug use: No   Sexual activity: Not Currently  Other Topics Concern   Not on file  Social History Narrative   Not on file   Social Determinants of Health   Financial Resource Strain: Low Risk    Difficulty of Paying Living Expenses: Not hard at all  Food Insecurity: No Food Insecurity   Worried About Programme researcher, broadcasting/film/video in the Last Year: Never true   Ran Out of Food in the Last Year: Never true  Transportation Needs: No  Transportation Needs   Lack of Transportation (Medical): No   Lack of Transportation (Non-Medical): No  Physical Activity: Inactive   Days of Exercise per Week: 0 days   Minutes of Exercise per Session: 0 min  Stress: No Stress Concern Present   Feeling of Stress : Not at all  Social Connections: Socially Isolated   Frequency of Communication with Friends and Family: More than three times a week   Frequency of Social Gatherings with Friends and Family: More than three times a week   Attends Religious Services: Never   Database administrator or Organizations: No   Attends Banker Meetings: Never   Marital Status: Widowed     Family History: The patient's family history includes Diabetes in her maternal grandfather and mother; Hypertension in her mother.  ROS:   Please see the history of present illness.    She states that she once had sleep apnea but does not think she has it now.  Cannot remember if she was on CPAP.  She refused to see an extender to have her a prescription for metoprolol succinate renewed.  She has been off medication now for greater than a month.  She has not had chest pain, shortness of breath, edema, palpitations or any other cardiac symptom.  Conversationally, it is difficult to follow her train of thought.  All other systems reviewed and are negative.  EKGs/Labs/Other Studies Reviewed:    The following studies were reviewed today: No new or recent imaging  EKG:  EKG normal sinus rhythm, left axis deviation, and when compared to the prior tracing from April 2021, no changes occurred.  Recent Labs: 02/20/2021: ALT 16; BUN 26; Creatinine, Ser 0.99; Hemoglobin 12.9; Platelets 202.0; Potassium 4.3; Sodium 141; TSH 1.03  Recent Lipid Panel    Component Value Date/Time   CHOL 154 02/20/2021 1222   TRIG 126.0 02/20/2021 1222   HDL 47.00 02/20/2021 1222   CHOLHDL 3 02/20/2021 1222   VLDL 25.2 02/20/2021 1222   LDLCALC 82 02/20/2021 1222    Physical  Exam:    VS:  BP (!) 180/84   Pulse 74   Ht 5\' 3"  (1.6 m)   Wt 170 lb 3.2 oz (77.2 kg)   SpO2 99%   BMI 30.15 kg/m     Wt Readings from Last 3 Encounters:  06/25/21 170 lb 3.2 oz (77.2 kg)  05/09/21 170 lb (77.1 kg)  02/20/21 167 lb 3.2 oz (75.8 kg)     GEN: Seems somewhat aggravated/agitated when I entered the room.. No acute  distress HEENT: Normal NECK: No JVD. LYMPHATICS: No lymphadenopathy CARDIAC: No murmur. RRR no gallop, or edema. VASCULAR:  Normal Pulses. No bruits. RESPIRATORY:  Clear to auscultation without rales, wheezing or rhonchi  ABDOMEN: Soft, non-tender, non-distended, No pulsatile mass, MUSCULOSKELETAL: No deformity  SKIN: Warm and dry NEUROLOGIC:  Alert and oriented x 3.  Difficulty with memory as suggested by the conversation although I did not do a formal evaluation. PSYCHIATRIC: Angry and speaking loudly.  ASSESSMENT:    1. Essential hypertension   2. Type 2 diabetes mellitus with complication, with long-term current use of insulin (HCC)   3. Mixed hyperlipidemia   4. Dementia without behavioral disturbance (HCC)   5. Controlled type 2 diabetes mellitus with diabetic nephropathy, without long-term current use of insulin (HCC)   6. Aortic dilatation (HCC)   7. Aortic aneurysm without rupture, unspecified portion of aorta (HCC)    PLAN:    In order of problems listed above:  Not well controlled.  Resume Toprol-XL 75 mg daily.  Target is 140/80 mmHg. Hemoglobin A1c was 9.8 in June.  When discussed with the patient she states that she was not aware of this.  She advocates compliance with Glucophage and Lantus. Continue rosuvastatin 20 mg/day.  Most recent LDL was 82 in June 2022. Today's visit confirms a cognitive issue.  She cannot follow the conversation and cannot rationally understand why she was only given a limited supply of Toprol-XL (greater than 1 year since her last prescription was written.  Explained to her that the prescription needs to be  written every 12 months and is not on continuous renewal. Will need identified supporting data.  Echocardiogram from 2012 revealed normal aortic root size and ascending aortic diameter.   Medication Adjustments/Labs and Tests Ordered: Current medicines are reviewed at length with the patient today.  Concerns regarding medicines are outlined above.  Orders Placed This Encounter  Procedures   EKG 12-Lead    Meds ordered this encounter  Medications   TOPROL XL 25 MG 24 hr tablet    Sig: TAKE 2 TABS BY MOUTH EACH MORNING AND 1 TAB EACH EVENING    Dispense:  270 tablet    Refill:  3     Patient Instructions  Medication Instructions:  Your physician recommends that you continue on your current medications as directed. Please refer to the Current Medication list given to you today.  *If you need a refill on your cardiac medications before your next appointment, please call your pharmacy*   Lab Work: None If you have labs (blood work) drawn today and your tests are completely normal, you will receive your results only by: MyChart Message (if you have MyChart) OR A paper copy in the mail If you have any lab test that is abnormal or we need to change your treatment, we will call you to review the results.   Testing/Procedures: None   Follow-Up: At [facility], you and your health needs are our priority.  As part of our continuing mission to provide you with exceptional heart care, we have created designated Provider Care Teams.  These Care Teams include your primary Cardiologist (physician) and Advanced Practice Providers (APPs -  Physician Assistants and Nurse Practitioners) who all work together to provide you with the care you need, when you need it.  We recommend signing up for the patient portal called "MyChart".  Sign up information is provided on this After Visit Summary.  MyChart is used to connect with patients for Virtual  Visits (Telemedicine).  Patients are able to view  lab/test results, encounter notes, upcoming appointments, etc.  Non-urgent messages can be sent to your provider as well.   To learn more about what you can do with MyChart, go to [email or web address].    Your next appointment:   1 year(s)  The format for your next appointment:   In Person  Provider:   You may see Lesleigh Noe, MD or one of the following Advanced Practice Providers on your designated Care Team:   Nada Boozer, NP   Other Instructions     Signed, Lesleigh Noe, MD  06/25/2021 5:52 PM    [facility]

## 2021-06-25 ENCOUNTER — Ambulatory Visit (INDEPENDENT_AMBULATORY_CARE_PROVIDER_SITE_OTHER): Payer: Medicare Other | Admitting: Interventional Cardiology

## 2021-06-25 ENCOUNTER — Other Ambulatory Visit: Payer: Self-pay

## 2021-06-25 ENCOUNTER — Encounter: Payer: Self-pay | Admitting: Interventional Cardiology

## 2021-06-25 VITALS — BP 180/84 | HR 74 | Ht 63.0 in | Wt 170.2 lb

## 2021-06-25 DIAGNOSIS — I77819 Aortic ectasia, unspecified site: Secondary | ICD-10-CM | POA: Diagnosis not present

## 2021-06-25 DIAGNOSIS — I719 Aortic aneurysm of unspecified site, without rupture: Secondary | ICD-10-CM | POA: Diagnosis not present

## 2021-06-25 DIAGNOSIS — E782 Mixed hyperlipidemia: Secondary | ICD-10-CM | POA: Diagnosis not present

## 2021-06-25 DIAGNOSIS — F0390 Unspecified dementia without behavioral disturbance: Secondary | ICD-10-CM

## 2021-06-25 DIAGNOSIS — I10 Essential (primary) hypertension: Secondary | ICD-10-CM | POA: Diagnosis not present

## 2021-06-25 DIAGNOSIS — E118 Type 2 diabetes mellitus with unspecified complications: Secondary | ICD-10-CM | POA: Diagnosis not present

## 2021-06-25 DIAGNOSIS — Z794 Long term (current) use of insulin: Secondary | ICD-10-CM | POA: Diagnosis not present

## 2021-06-25 DIAGNOSIS — E1121 Type 2 diabetes mellitus with diabetic nephropathy: Secondary | ICD-10-CM

## 2021-06-25 MED ORDER — TOPROL XL 25 MG PO TB24: ORAL_TABLET | ORAL | 3 refills | Status: DC

## 2021-06-25 NOTE — Patient Instructions (Signed)

## 2021-07-04 DIAGNOSIS — H04123 Dry eye syndrome of bilateral lacrimal glands: Secondary | ICD-10-CM | POA: Diagnosis not present

## 2021-07-04 DIAGNOSIS — H53003 Unspecified amblyopia, bilateral: Secondary | ICD-10-CM | POA: Diagnosis not present

## 2021-07-04 DIAGNOSIS — Z961 Presence of intraocular lens: Secondary | ICD-10-CM | POA: Diagnosis not present

## 2021-07-04 DIAGNOSIS — H3581 Retinal edema: Secondary | ICD-10-CM | POA: Diagnosis not present

## 2021-07-11 ENCOUNTER — Encounter: Payer: Self-pay | Admitting: Family Medicine

## 2021-07-11 ENCOUNTER — Ambulatory Visit (INDEPENDENT_AMBULATORY_CARE_PROVIDER_SITE_OTHER): Payer: Medicare Other | Admitting: Family Medicine

## 2021-07-11 ENCOUNTER — Other Ambulatory Visit: Payer: Self-pay

## 2021-07-11 VITALS — BP 152/80 | HR 78 | Temp 98.7°F | Ht 63.0 in | Wt 169.2 lb

## 2021-07-11 DIAGNOSIS — F0390 Unspecified dementia without behavioral disturbance: Secondary | ICD-10-CM

## 2021-07-11 DIAGNOSIS — Z794 Long term (current) use of insulin: Secondary | ICD-10-CM | POA: Diagnosis not present

## 2021-07-11 DIAGNOSIS — I10 Essential (primary) hypertension: Secondary | ICD-10-CM

## 2021-07-11 DIAGNOSIS — Z23 Encounter for immunization: Secondary | ICD-10-CM | POA: Diagnosis not present

## 2021-07-11 DIAGNOSIS — E1140 Type 2 diabetes mellitus with diabetic neuropathy, unspecified: Secondary | ICD-10-CM

## 2021-07-11 LAB — POCT GLYCOSYLATED HEMOGLOBIN (HGB A1C): Hemoglobin A1C: 8.2 % — AB (ref 4.0–5.6)

## 2021-07-11 NOTE — Progress Notes (Signed)
Subjective:    Patient ID: Karen Powell, female    DOB: 06-09-1936, 85 y.o.   MRN: [id number]  Chief Complaint  Patient presents with   Follow-up    HPI Patient was seen today for follow up.  Pt states she is doing well.  She is considering letting her family place her in a SNF, but she is concerned about the size of the room at the facility.  Pt endorses recent eye exam.  Seen by Dr. Oliva Bustard. Karen Powell.  Pt has a record of fsbs: 224, 284, 204, 299, 297, 78, 150, 195, 324, 171, 195, 114, 144, 148, 138, 131, 152, 116.  Pt states she was eating the wrong things when it was high and she knows how to take care of "Karen Powell".  Pt states she is supposed to meet a new provider at the Cardiology office but has no plans on doing so.  Pt states she had her COVID vaccines and a booster: 03/01/20, 03/29/20, and 10/06/20.  Past Medical History:  Diagnosis Date   Arthritis    Blood transfusion without reported diagnosis    Depression    Diabetic neuropathy (HCC)    Dizziness    Frequent headaches    Glaucoma    Hyperlipidemia    Hypertension    Migraines    Palpitations    Urinary incontinence     Allergies  Allergen Reactions   Penicillins Anaphylaxis    ROS General: Denies fever, chills, night sweats, changes in weight, changes in appetite HEENT: Denies headaches, ear pain, changes in vision, rhinorrhea, sore throat CV: Denies CP, palpitations, SOB, orthopnea Pulm: Denies SOB, cough, wheezing GI: Denies abdominal pain, nausea, vomiting, diarrhea, constipation GU: Denies dysuria, hematuria, frequency, vaginal discharge Msk: Denies muscle cramps, joint pains Neuro: Denies weakness, numbness, tingling Skin: Denies rashes, bruising Psych: Denies depression, anxiety, hallucinations    Objective:    Blood pressure (!) 152/80, pulse 78, temperature 98.7 F (37.1 C), height 5\' 3"  (1.6 m), weight 169 lb 3.2 oz (76.7 kg), SpO2 98 %.  Gen. Pleasant, well-nourished, in no distress, normal affect    HEENT: [place]/AT, face symmetric, conjunctiva clear, no scleral icterus, PERRLA, EOMI, nares patent without drainage Lungs: no accessory muscle use, CTAB, no wheezes or rales Cardiovascular: RRR, no m/r/g, no peripheral edema Abdomen: BS present, soft, NT/ND, no hepatosplenomegaly. Musculoskeletal: No deformities, no cyanosis or clubbing, normal tone Neuro:  A&Ox3, CN II-XII intact, normal gait Skin:  Warm, no lesions/ rash   Wt Readings from Last 3 Encounters:  07/11/21 169 lb 3.2 oz (76.7 kg)  06/25/21 170 lb 3.2 oz (77.2 kg)  05/09/21 170 lb (77.1 kg)    Lab Results  Component Value Date   WBC 5.0 02/20/2021   HGB 12.9 02/20/2021   HCT 39.1 02/20/2021   PLT 202.0 02/20/2021   GLUCOSE 433 (H) 02/20/2021   CHOL 154 02/20/2021   TRIG 126.0 02/20/2021   HDL 47.00 02/20/2021   LDLCALC 82 02/20/2021   ALT 16 02/20/2021   AST 20 02/20/2021   NA 141 02/20/2021   K 4.3 02/20/2021   CL 103 02/20/2021   CREATININE 0.99 02/20/2021   BUN 26 (H) 02/20/2021   CO2 19 02/20/2021   TSH 1.03 02/20/2021   HGBA1C 9.8 (H) 02/20/2021    Assessment/Plan:  Essential hypertension -uncontrolled.  Likely 2/2 noncompliance worsened by memory deficit, and diet. -To avoid confusion will continue current medication doses -discussed the importance of lifestyle modifications.  Type 2 diabetes mellitus with diabetic  neuropathy, with long-term current use of insulin (HCC)  -uncontrolled 2/2 noncompliance and diet worsened by memory deficit. -continue current medications -lifestyle modifications and family involvement encouraged. - Plan: POC HgB A1c  Dementia without behavioral disturbance (HCC) -declines medication at this time -advised to consider Neuropsych testing and imaging -discussed the importance of family involvement. -continue regular f/u at short intervals.  Need for immunization against influenza  - Plan: Flu Vaccine QUAD High Dose(Fluad)  F/u 2-3 months  Abbe Amsterdam, MD

## 2021-07-11 NOTE — Patient Instructions (Signed)
Consider having the memory testing done.  If this is something you decided you can always contact the office so we can set this up for you.  Consider meeting the other Cardiologist.

## 2021-07-12 ENCOUNTER — Other Ambulatory Visit: Payer: Self-pay | Admitting: Family Medicine

## 2021-07-12 DIAGNOSIS — Z23 Encounter for immunization: Secondary | ICD-10-CM | POA: Diagnosis not present

## 2021-07-12 DIAGNOSIS — I10 Essential (primary) hypertension: Secondary | ICD-10-CM

## 2021-07-30 ENCOUNTER — Encounter: Payer: Self-pay | Admitting: Family Medicine

## 2021-09-04 ENCOUNTER — Other Ambulatory Visit: Payer: Self-pay | Admitting: Family Medicine

## 2021-10-01 ENCOUNTER — Other Ambulatory Visit: Payer: Self-pay | Admitting: Family Medicine

## 2021-10-01 DIAGNOSIS — E1140 Type 2 diabetes mellitus with diabetic neuropathy, unspecified: Secondary | ICD-10-CM

## 2021-10-01 DIAGNOSIS — Z794 Long term (current) use of insulin: Secondary | ICD-10-CM

## 2021-10-03 ENCOUNTER — Ambulatory Visit (INDEPENDENT_AMBULATORY_CARE_PROVIDER_SITE_OTHER): Payer: Medicare Other | Admitting: Family Medicine

## 2021-10-03 ENCOUNTER — Encounter: Payer: Self-pay | Admitting: Family Medicine

## 2021-10-03 VITALS — BP 142/88 | HR 86 | Temp 98.8°F | Wt 167.4 lb

## 2021-10-03 DIAGNOSIS — E1140 Type 2 diabetes mellitus with diabetic neuropathy, unspecified: Secondary | ICD-10-CM | POA: Diagnosis not present

## 2021-10-03 DIAGNOSIS — I10 Essential (primary) hypertension: Secondary | ICD-10-CM | POA: Diagnosis not present

## 2021-10-03 DIAGNOSIS — E782 Mixed hyperlipidemia: Secondary | ICD-10-CM

## 2021-10-03 DIAGNOSIS — Z794 Long term (current) use of insulin: Secondary | ICD-10-CM

## 2021-10-03 DIAGNOSIS — I719 Aortic aneurysm of unspecified site, without rupture: Secondary | ICD-10-CM

## 2021-10-03 DIAGNOSIS — F0390 Unspecified dementia without behavioral disturbance: Secondary | ICD-10-CM | POA: Diagnosis not present

## 2021-10-03 MED ORDER — ROSUVASTATIN CALCIUM 20 MG PO TABS: ORAL_TABLET | ORAL | 3 refills | Status: DC

## 2021-10-03 MED ORDER — LOSARTAN POTASSIUM-HCTZ 100-25 MG PO TABS: 1.0000 | ORAL_TABLET | Freq: Every day | ORAL | 3 refills | Status: DC

## 2021-10-03 MED ORDER — METFORMIN HCL 500 MG PO TABS: 500.0000 mg | ORAL_TABLET | Freq: Two times a day (BID) | ORAL | 2 refills | Status: DC

## 2021-10-03 MED ORDER — LANTUS SOLOSTAR 100 UNIT/ML ~~LOC~~ SOPN: 57.0000 [IU] | PEN_INJECTOR | Freq: Every day | SUBCUTANEOUS | 4 refills | Status: DC

## 2021-10-03 MED ORDER — NOVOLOG FLEXPEN 100 UNIT/ML ~~LOC~~ SOPN: PEN_INJECTOR | SUBCUTANEOUS | 11 refills | Status: DC

## 2021-10-03 MED ORDER — FREESTYLE LIBRE 2 SENSOR MISC: 11 refills | Status: DC

## 2021-10-03 NOTE — Progress Notes (Signed)
Subjective:    Patient ID: Karen Powell, female    DOB: 1936-08-11, 86 y.o.   MRN: [id number]  Chief Complaint  Patient presents with   Follow-up    BP    HPI Patient was seen today for f/u. Pt states she is doing well.  Eager to show this provider her new insurance card with [employer].  States has to use [facility] mail order pharmacy.  Pt likes using the freestyle libre sensor.  BS in clinic 67 as pt has not eaten yet, asymptomatic.  States "don't worry I'll get it up" and puts a cough drop in her mouth.  BS at home 200-70s.  Taking lantus 57 units qhs and novolog 15 units TID with meals.  States taking all her meds.    Past Medical History:  Diagnosis Date   Arthritis    Blood transfusion without reported diagnosis    Depression    Diabetic neuropathy (HCC)    Dizziness    Frequent headaches    Glaucoma    Hyperlipidemia    Hypertension    Migraines    Palpitations    Urinary incontinence     Allergies  Allergen Reactions   Penicillins Anaphylaxis    ROS +dementia General: Denies fever, chills, night sweats, changes in weight, changes in appetite   HEENT: Denies headaches, ear pain, changes in vision, rhinorrhea, sore throat CV: Denies CP, palpitations, SOB, orthopnea Pulm: Denies SOB, cough, wheezing GI: Denies abdominal pain, nausea, vomiting, diarrhea, constipation GU: Denies dysuria, hematuria, frequency, vaginal discharge Msk: Denies muscle cramps, joint pains Neuro: Denies weakness, numbness, tingling Skin: Denies rashes, bruising Psych: Denies depression, anxiety, hallucinations    Objective:    Blood pressure (!) 142/88, pulse 86, temperature 98.8 F (37.1 C), temperature source Oral, weight 167 lb 6.4 oz (75.9 kg), SpO2 98 %.  Gen. Pleasant, well-nourished, in no distress, normal affect   HEENT: [place]/AT, face symmetric, conjunctiva clear, no scleral icterus, PERRLA, EOMI, nares patent without drainage Lungs: no accessory muscle use, CTAB, no wheezes  or rales Cardiovascular: RRR, no m/r/g, trace LE edema Abdomen: BS present, soft, NT/ND Musculoskeletal: No deformities, no cyanosis or clubbing, normal tone Neuro:  A&Ox3, CN II-XII intact, normal gait Skin:  Warm, no lesions/ rash   Wt Readings from Last 3 Encounters:  10/03/21 167 lb 6.4 oz (75.9 kg)  07/11/21 169 lb 3.2 oz (76.7 kg)  06/25/21 170 lb 3.2 oz (77.2 kg)    Lab Results  Component Value Date   WBC 5.0 02/20/2021   HGB 12.9 02/20/2021   HCT 39.1 02/20/2021   PLT 202.0 02/20/2021   GLUCOSE 433 (H) 02/20/2021   CHOL 154 02/20/2021   TRIG 126.0 02/20/2021   HDL 47.00 02/20/2021   LDLCALC 82 02/20/2021   ALT 16 02/20/2021   AST 20 02/20/2021   NA 141 02/20/2021   K 4.3 02/20/2021   CL 103 02/20/2021   CREATININE 0.99 02/20/2021   BUN 26 (H) 02/20/2021   CO2 19 02/20/2021   TSH 1.03 02/20/2021   HGBA1C 8.2 (A) 07/11/2021    Assessment/Plan:  Essential hypertension  -elevated -compliance a concern given dementia. -continue current medications including losartan-hydrochlorothiazide, Toprol-XL 50 mg in a.m. and 25 mg - Plan: losartan-hydrochlorothiazide (HYZAAR) 100-25 MG tablet  Type 2 diabetes mellitus with diabetic neuropathy, with long-term current use of insulin (HCC)  -Hemoglobin A1c 8.2% on 07/11/2021 -BS in clinic 67 per freestyle libre sensor -Current medications including Lantus 57 units nightly, NovoLog 15 units  3 times daily with meals, metformin 500 mg twice daily -Continue ARB and statin -Foot exam at next [facility] -Diabetic retinopathy screen encouraged. - Plan: Continuous Blood Gluc Sensor (FREESTYLE LIBRE 2 SENSOR) MISC, insulin glargine (LANTUS SOLOSTAR) 100 UNIT/ML Solostar Pen, metFORMIN (GLUCOPHAGE) 500 MG tablet, insulin aspart (NOVOLOG FLEXPEN) 100 UNIT/ML FlexPen  Mixed hyperlipidemia -Controlled -Total cholesterol 154, triglycerides 126, LDL 82, HDL 47 on 02/20/2021. - Plan: rosuvastatin (CRESTOR) 20 MG tablet  Dementia without  behavioral disturbance (HCC) -Stable -Not currently on medication -Family involvement encouraged.  Met with hesitation.  Aortic aneurysm without rupture, unspecified portion of aorta (HCC) -Stable -Continue Toprol-XL -Continue follow-up with cardiology  F/u in 3-4 months, sooner if needed  Grier Mitts, MD

## 2021-10-03 NOTE — Patient Instructions (Signed)
Your prescriptions where sent to the mail order pharmacy [facility].

## 2021-10-10 ENCOUNTER — Other Ambulatory Visit: Payer: Self-pay

## 2021-10-10 ENCOUNTER — Encounter: Payer: Self-pay | Admitting: Family Medicine

## 2021-10-10 ENCOUNTER — Ambulatory Visit (INDEPENDENT_AMBULATORY_CARE_PROVIDER_SITE_OTHER): Payer: Medicare Other | Admitting: Family Medicine

## 2021-10-10 ENCOUNTER — Ambulatory Visit (INDEPENDENT_AMBULATORY_CARE_PROVIDER_SITE_OTHER): Payer: Medicare Other

## 2021-10-10 VITALS — BP 160/88 | HR 68 | Temp 98.9°F | Wt 166.8 lb

## 2021-10-10 DIAGNOSIS — F339 Major depressive disorder, recurrent, unspecified: Secondary | ICD-10-CM

## 2021-10-10 DIAGNOSIS — W19XXXA Unspecified fall, initial encounter: Secondary | ICD-10-CM

## 2021-10-10 DIAGNOSIS — I10 Essential (primary) hypertension: Secondary | ICD-10-CM

## 2021-10-10 DIAGNOSIS — R0789 Other chest pain: Secondary | ICD-10-CM

## 2021-10-10 DIAGNOSIS — F0390 Unspecified dementia without behavioral disturbance: Secondary | ICD-10-CM | POA: Diagnosis not present

## 2021-10-10 DIAGNOSIS — R079 Chest pain, unspecified: Secondary | ICD-10-CM | POA: Diagnosis not present

## 2021-10-10 MED ORDER — SERTRALINE HCL 25 MG PO TABS: 25.0000 mg | ORAL_TABLET | Freq: Every day | ORAL | 3 refills | Status: DC

## 2021-10-10 NOTE — Progress Notes (Signed)
Subjective:    Patient ID: Karen Powell, female    DOB: [birth year], 86 y.o.   MRN: [id number]  Chief Complaint  Patient presents with   [place] forward over the weekend, started feeling pain in chest under her breast 2 days after. Heating pads, aleve, and vicks salve.   Pt accompanied by her granddaughter.  HPI Patient was seen today for acute concern.  Pt endorses fall from standing 5-6 days ago after tripping on the side walk.  Pt landed on the left side of her body/chest.  Pt denies hitting head, LOC, pain in extremities.  Pt notes L sided chest pain with taking in a deep breath and with certain movements.  Pt tried Tylenol, vick's vapor rub, heat, and other topical meds.  Pt taking ASA 81 mg daily.  Pt requesting med to help her "stop running her mouth".  States it is causing her to loose family.  Does not want to go to counseling as does not think it will help.  Past Medical History:  Diagnosis Date   Arthritis    Blood transfusion without reported diagnosis    Depression    Diabetic neuropathy (HCC)    Dizziness    Frequent headaches    Glaucoma    Hyperlipidemia    Hypertension    Migraines    Palpitations    Urinary incontinence     Allergies  Allergen Reactions   Penicillins Anaphylaxis    ROS General: Denies fever, chills, night sweats, changes in weight, changes in appetite HEENT: Denies headaches, ear pain, changes in vision, rhinorrhea, sore throat CV: Denies CP, palpitations, SOB, orthopnea Pulm: Denies SOB, cough, wheezing GI: Denies abdominal pain, nausea, vomiting, diarrhea, constipation GU: Denies dysuria, hematuria, frequency, vaginal discharge Msk: Denies muscle cramps, joint pains Neuro: Denies weakness, numbness, tingling Skin: Denies rashes, bruising Psych: Denies depression, anxiety, hallucinations    Objective:    Blood pressure (!) 160/88, pulse 68, temperature 98.9 F (37.2 C), temperature source Oral, weight 166 lb 12.8 oz (75.7 kg),  SpO2 97 %.  Gen. Pleasant, well-nourished, in no distress, normal affect   HEENT: [place]/AT, face symmetric, conjunctiva clear, no scleral icterus, PERRLA, EOMI, nares patent without drainage Lungs: no accessory muscle use, CTAB, no wheezes or rales Cardiovascular: RRR, no m/r/g, no peripheral edema Abdomen: BS present, soft, NT/ND Musculoskeletal: TTP of Left chest an sternum, no displaced ribs appreciated, no deformities, no cyanosis or clubbing, normal tone Neuro:  A&Ox3, CN II-XII intact, normal gait without assistance Skin:  Warm, no lesions/ rash, no ecchymosis   Wt Readings from Last 3 Encounters:  10/10/21 166 lb 12.8 oz (75.7 kg)  10/03/21 167 lb 6.4 oz (75.9 kg)  07/11/21 169 lb 3.2 oz (76.7 kg)    Lab Results  Component Value Date   WBC 5.0 02/20/2021   HGB 12.9 02/20/2021   HCT 39.1 02/20/2021   PLT 202.0 02/20/2021   GLUCOSE 433 (H) 02/20/2021   CHOL 154 02/20/2021   TRIG 126.0 02/20/2021   HDL 47.00 02/20/2021   LDLCALC 82 02/20/2021   ALT 16 02/20/2021   AST 20 02/20/2021   NA 141 02/20/2021   K 4.3 02/20/2021   CL 103 02/20/2021   CREATININE 0.99 02/20/2021   BUN 26 (H) 02/20/2021   CO2 19 02/20/2021   TSH 1.03 02/20/2021   HGBA1C 8.2 (A) 07/11/2021   Depression screen PHQ 2/9 10/03/2021 05/09/2021 10/24/2020  Decreased Interest 3 2 2   Down, Depressed, Hopeless 2 2  1  PHQ - 2 Score 5 4 3   Altered sleeping 1 - 1  Tired, decreased energy 0 2 1  Change in appetite 1 2 2   Feeling bad or failure about yourself  0 2 1  Trouble concentrating 1 0 1  Moving slowly or fidgety/restless 0 0 2  Suicidal thoughts 0 0 0  PHQ-9 Score 8 - 11  Difficult doing work/chores - - Somewhat difficult     Assessment/Plan:  Chest wall pain -Given history of fall and aspirin use will obtain imaging -Tenderness to palpation on exam without deformity -Discussed topical analgesics, heat, Tylenol.  Patient encouraged to take deep breaths intermittently throughout the day to avoid  atelectasis/pneumonia -Advised likely duration of symptoms - Plan: DG Chest 2 View  Fall from standing, initial encounter  -Fall prevention - Plan: DG Chest 2 View  Dementia without behavioral disturbance ([place]) -Stable -Not currently on medication as has declined in the past  Depression, recurrent (HCC) -PHQ 9 score 8 this visit -Encouraged to consider counseling -We will start Zoloft 25 mg daily.  Discussed r/b/a. -Follow-up in 4-6 weeks - Plan: sertraline (ZOLOFT) 25 MG tablet  Essential hypertension -Elevated, likely 2/2 pain -Continue current medications -Continue lifestyle modifications  F/u follow-up in 4-6 weeks, sooner if needed  Grier Mitts, MD

## 2021-10-11 ENCOUNTER — Other Ambulatory Visit: Payer: Self-pay | Admitting: *Deleted

## 2021-10-11 ENCOUNTER — Telehealth: Payer: Self-pay | Admitting: Interventional Cardiology

## 2021-10-11 MED ORDER — TOPROL XL 25 MG PO TB24: ORAL_TABLET | ORAL | 2 refills | Status: DC

## 2021-10-11 NOTE — Telephone Encounter (Signed)
°*  STAT* If patient is at the pharmacy, call can be transferred to refill team.   1. Which medications need to be refilled? (please list name of each medication and dose if known) TOPROL XL 25 MG 24 hr tablet  2. Which pharmacy/location (including street and city if local pharmacy) is medication to be sent to? [facility] Delivery) - [place], [place] - [address] [place]  3. Do they need a 30 day or 90 day supply? 270 day supply

## 2021-10-11 NOTE — Telephone Encounter (Signed)
Pt's medication was already sent to pt's pharmacy as requested. Confirmation received.  

## 2021-10-16 ENCOUNTER — Telehealth: Payer: Self-pay | Admitting: Family Medicine

## 2021-10-16 ENCOUNTER — Other Ambulatory Visit: Payer: Self-pay

## 2021-10-16 ENCOUNTER — Telehealth: Payer: Self-pay | Admitting: Interventional Cardiology

## 2021-10-16 DIAGNOSIS — F339 Major depressive disorder, recurrent, unspecified: Secondary | ICD-10-CM

## 2021-10-16 MED ORDER — ZOLOFT 25 MG PO TABS: 25.0000 mg | ORAL_TABLET | Freq: Every day | ORAL | 3 refills | Status: DC

## 2021-10-16 MED ORDER — TOPROL XL 25 MG PO TB24: ORAL_TABLET | ORAL | 2 refills | Status: DC

## 2021-10-16 NOTE — Telephone Encounter (Signed)
Pt is calling to let dr banks she does not do generic medication she needs name brand. Pt does not know the name of medication its medication that control her temper with the family she said  [facility]) [place], [place] - [address] Phone:  [phone]  Fax:  [phone]    Pt does not want her name listed but dr banks can call her at [phone]

## 2021-10-16 NOTE — Telephone Encounter (Signed)
Rx re-sent for brand name.

## 2021-10-16 NOTE — Telephone Encounter (Signed)
°*  STAT* If patient is at the pharmacy, call can be transferred to refill team.   1. Which medications need to be refilled? (please list name of each medication and dose if known) TOPROL XL 25 MG 24 hr tablet (does not want generic) Tier one  2. Which pharmacy/location (including street and city if local pharmacy) is medication to be sent to? [facility]) - [place], [place] - [address] [place]  3. Do they need a 30 day or 90 day supply? 90 day

## 2021-10-17 ENCOUNTER — Telehealth: Payer: Self-pay | Admitting: Interventional Cardiology

## 2021-10-17 NOTE — Telephone Encounter (Signed)
Patient called and made mention that Dr. Katrinka Blazing needed to fill out a form so that she get can get the TOPROL XL 25 MG 24 hr tablet. She does not want the generic brand.   *STAT* If patient is at the pharmacy, call can be transferred to refill team.   1. Which medications need to be refilled? (please list name of each medication and dose if known) TOPROL XL 25 MG 24 hr tablet  2. Which pharmacy/location (including street and city if local pharmacy) is medication to be sent to? [facility] Delivery) - [place], [place] - [address] [place]  3. Do they need a 30 day or 90 day supply? 90

## 2021-11-06 NOTE — Progress Notes (Addendum)
[facility] Note  11/12/2021     CHIEF COMPLAINT Patient presents for Retina Evaluation   HISTORY OF PRESENT ILLNESS: Karen Powell is a 86 y.o. female who presents to the clinic today for:   HPI     Retina Evaluation   In left eye.  I, the attending physician,  performed the HPI with the patient and updated documentation appropriately.        Comments   Patient referred by Dr. Lucianne Lei for possible macular edema OS. Patient states no recent eye surgeries. No eye trauma. Using systane prn OU. BS was 86 this am. Last a1c was 8.2, checked 4 months ago. Patient very vague on history. May have had an eye surgery in the past, patient unsure. Patient states vision worse in the evening, especially for reading and watching TV. BS tends to spike at times, which patient associates with blurred vision.       Last edited by Bernarda Caffey, MD on 11/13/2021 11:38 AM.    Pt is here on the referral of Dr. Lucianne Lei for concern of macular edema OS, pt states she is diabetic and hypertensive, she takes insulin and oral medication, her blood sugar fluctuates between 118-380, she states it has done that for years, last A1c was 8.2 on 10.20.22, she is using AT's for dryness per Dr. Lucianne Lei, she had cataract sx in [place], pt realized today while reading the eye chart that her left eye was weaker than her right  Referring physician: Lisabeth Pick, MD [address],  [place] [address]  HISTORICAL INFORMATION:   Selected notes from the MEDICAL RECORD NUMBER Referred by Dr. Lucianne Lei for mac edema LEE: 07/04/21 BCVA OD: 20/40 OS: 20/100 Ocular Hx- amblyopia, macular edema OS PMH- DM, HTN, migraines, high cholesterol; last a1c was 8.2 on 07/11/21    CURRENT MEDICATIONS: Current Outpatient Medications (Ophthalmic Drugs)  Medication Sig   Polyethyl Glycol-Propyl Glycol (SYSTANE ULTRA OP) Apply 1 drop to eye daily.   No current facility-administered medications for this visit.  (Ophthalmic Drugs)   Current Outpatient Medications (Other)  Medication Sig   Alcohol Swabs PADS 1 each by Does not apply route 3 (three) times daily. Used for checking Blood glucose at least 3 times daily   Apoaequorin (PREVAGEN PO) Take 1 capsule by mouth daily.   aspirin EC 81 MG tablet Take 1 tablet (81 mg total) by mouth daily.   BD PEN NEEDLE MICRO U/F 32G X 6 MM MISC USE TO ADMINISTER INSULIN THREE TIMES DAILY   Cetirizine HCl (ZYRTEC ALLERGY PO) Take 1 tablet by mouth daily.   Coenzyme Q10 (COQ10) 200 MG CAPS Take 1 capsule by mouth daily.   Continuous Blood Gluc Sensor (FREESTYLE LIBRE 2 SENSOR) MISC Use to check blood sugars.   esomeprazole (NEXIUM) 20 MG capsule Take 20 mg by mouth daily at 12 noon.   insulin aspart (NOVOLOG FLEXPEN) 100 UNIT/ML FlexPen INJECT 15 UNITS INTO THE SKIN 3 (THREE) TIMES DAILY WITH MEALS.   insulin glargine (LANTUS SOLOSTAR) 100 UNIT/ML Solostar Pen Inject 57 Units into the skin at bedtime.   losartan-hydrochlorothiazide (HYZAAR) 100-25 MG tablet Take 1 tablet by mouth daily.   metFORMIN (GLUCOPHAGE) 500 MG tablet Take 1 tablet (500 mg total) by mouth 2 (two) times daily.   Multiple Vitamins-Minerals (CENTRUM PO) Take 1 tablet by mouth daily.   Naproxen Sodium (ALEVE PO) Take 2 tablets by mouth daily as needed (pain).    Omega-3 Fatty Acids (OMEGA  3 PO) Take 1 capsule by mouth daily.   Probiotic Product (PROBIOTIC PO) Take 1 capsule by mouth at bedtime.   rosuvastatin (CRESTOR) 20 MG tablet TAKE 1 TABLET BY MOUTH EVERYDAY AT BEDTIME   TOPROL XL 25 MG 24 hr tablet TAKE 2 TABS BY MOUTH EACH MORNING AND 1 TAB EACH EVENING   ZOLOFT 25 MG tablet Take 1 tablet (25 mg total) by mouth daily.   Sennosides-Docusate Sodium (SENOKOT S PO) Take 1 tablet by mouth 2 (two) times daily.   Turmeric 500 MG TABS Take 1 tablet by mouth daily.   No current facility-administered medications for this visit. (Other)   REVIEW OF SYSTEMS: ROS   Positive for: Endocrine,  Cardiovascular, Eyes Negative for: Constitutional, Gastrointestinal, Neurological, Skin, Genitourinary, Musculoskeletal, HENT, Respiratory, Psychiatric, Allergic/Imm, Heme/Lymph Last edited by Jobe Marker, COT on 11/12/2021  1:33 PM.     ALLERGIES Allergies  Allergen Reactions   Penicillins Anaphylaxis   PAST MEDICAL HISTORY Past Medical History:  Diagnosis Date   Arthritis    Blood transfusion without reported diagnosis    Depression    Diabetic neuropathy ([place])    Dizziness    Frequent headaches    Hyperlipidemia    Hypertension    Migraines    Palpitations    Urinary incontinence    Past Surgical History:  Procedure Laterality Date   none     FAMILY HISTORY Family History  Problem Relation Age of Onset   Diabetes Mother    Hypertension Mother    Diabetes Maternal Grandmother    Diabetes Maternal Grandfather    SOCIAL HISTORY Social History   Tobacco Use   Smoking status: Never   Smokeless tobacco: Never  Vaping Use   Vaping Use: Never used  Substance Use Topics   Alcohol use: Yes   Drug use: No       OPHTHALMIC EXAM:  Base Eye Exam     Visual Acuity (Snellen - Linear)       Right Left   Dist cc 20/50 +2 20/100 -1   Dist ph cc 20/30 -1 20/100 +2    Correction: Glasses         Tonometry (Tonopen, 1:51 PM)       Right Left   Pressure 09 11         Pupils       Dark Light Shape React APD   Right 4 3 Round Brisk None   Left 4 3 Round Brisk None         Visual Fields (Counting fingers)       Left Right    Full Full         Extraocular Movement       Right Left    Full, Ortho Full, Ortho         Neuro/Psych     Oriented x3: Yes   Mood/Affect: Normal         Dilation     Both eyes: 1.0% Mydriacyl, 2.5% Phenylephrine @ 1:51 PM           Slit Lamp and Fundus Exam     Slit Lamp Exam       Right Left   Lids/Lashes Dermatochalasis - upper lid Dermatochalasis - upper lid   Conjunctiva/Sclera nasal and  temporal pinguecula, mild melanosis nasal and temporal pinguecula, mild melanosis   Cornea 2+ Punctate epithelial erosions, mild arcus 2+ Punctate epithelial erosions, mild arcus   Anterior Chamber Deep and quiet Deep  and quiet   Iris Round and dilated Round and dilated   Lens 3 piece PC IOL, open PC 3 piece PC IOL, open PC   Anterior Vitreous Vitreous syneresis, Posterior vitreous detachment, mild Asteroid hyalosis inferiorly Vitreous syneresis, Posterior vitreous detachment         Fundus Exam       Right Left   Disc Pink and Sharp, mild PPA/PPP Pink and Sharp, mild temporal PPA   C/D Ratio 0.3 0.5   Macula Flat, Good foveal reflex, rare MA Blunted foveal reflex, central cystic changes, +MA   Vessels attenuated, mild tortuosity, mild Copper wiring attenuated, mild tortuosity, mild Copper wiring   Periphery Attached, mild midzonal drusen, rare MA Attached, mild midzonal drusen, mild reticular degeneration, rare MA           Refraction     Wearing Rx       Sphere Cylinder Axis Add   Right -0.50 Sphere  +2.50   Left -0.50 +0.50 090 +2.50         Manifest Refraction       Sphere Cylinder Axis Dist VA   Right -1.50 +0.50 005 20/20-2   Left -1.50 +1.50 090 20/100            IMAGING AND PROCEDURES  Imaging and Procedures for 11/12/2021  OCT, Retina - OU - Both Eyes       Right Eye Quality was good. Central Foveal Thickness: 255. Progression has no prior data. Findings include normal foveal contour, no IRF, no SRF (Rare drusen).   Left Eye Quality was good. Central Foveal Thickness: 371. Progression has no prior data. Findings include abnormal foveal contour, intraretinal fluid, intraretinal hyper-reflective material, no SRF (+IRF/cystic changes and IRHM temporal fovea).   Notes *Images captured and stored on drive  Diagnosis / Impression:  OD: NFP; no IRF/SRF OS: +IRF/cystic changes and IRHM temporal fovea -- +DME   Clinical management:  See  below  Abbreviations: NFP - Normal foveal profile. CME - cystoid macular edema. PED - pigment epithelial detachment. IRF - intraretinal fluid. SRF - subretinal fluid. EZ - ellipsoid zone. ERM - epiretinal membrane. ORA - outer retinal atrophy. ORT - outer retinal tubulation. SRHM - subretinal hyper-reflective material. IRHM - intraretinal hyper-reflective material      Intravitreal Injection, Pharmacologic Agent - OS - Left Eye       Time Out 11/13/2021. 8:20 AM. Confirmed correct patient, procedure, site, and patient consented.   Anesthesia Topical anesthesia was used. Anesthetic medications included Lidocaine 2%, Proparacaine 0.5%.   Procedure Preparation included 5% betadine to ocular surface, eyelid speculum. A (32g) needle was used.   Injection: 1.25 mg Bevacizumab 1.32m/0.05ml   Route: Intravitreal, Site: Left Eye   NDC:: 42876-811-57 Lot: [id number], Expiration date: 12/02/2021   Post-op Post injection exam found visual acuity of at least counting fingers. The patient tolerated the procedure well. There were no complications. The patient received written and verbal post procedure care education.            ASSESSMENT/PLAN:    ICD-10-CM   1. Both eyes affected by mild nonproliferative diabetic retinopathy with macular edema, associated with type 2 diabetes mellitus (HCC)  EW62.0355OCT, Retina - OU - Both Eyes    Intravitreal Injection, Pharmacologic Agent - OS - Left Eye    Bevacizumab (AVASTIN) SOLN 1.25 mg    2. Intermediate stage nonexudative age-related macular degeneration of both eyes  H35.3132     3. Essential hypertension  I10  4. Hypertensive retinopathy of both eyes  H35.033     5. Pseudophakia, both eyes  Z96.1      Mild Non-proliferative diabetic retinopathy, both eyes - OD: without DME - OS: +DME - The incidence, risk factors for progression, natural history and treatment options for diabetic retinopathy were discussed with patient.   - The need  for close monitoring of blood glucose, blood pressure, and serum lipids, avoiding cigarette or any type of tobacco, and the need for long term follow up was also discussed with patient. - exam shows rare MA OU - unable to obtain FA today -- poor IV access - OCT shows diabetic macular edema, left eye  - The natural history, pathology, and characteristics of diabetic macular edema discussed with patient.  A generalized discussion of the major clinical trials concerning treatment of diabetic macular edema (ETDRS, DCT, SCORE, RISE / RIDE, and ongoing DRCR net studies) was completed.  This discussion included mention of the various approaches to treating diabetic macular edema (observation, laser photocoagulation, anti-VEGF injections with lucentis / Avastin / Eylea, steroid injections with Kenalog / Ozurdex, and intraocular surgery with vitrectomy).  The goal hemoglobin A1C of 6-7 was discussed, as well as importance of smoking cessation and hypertension control.  Need for ongoing treatment and monitoring were specifically discussed with reference to chronic nature of diabetic macular edema. - recommend IVA OS #1 today, 02.21.22 for DME - pt wishes to proceed with injection - RBA of procedure discussed, questions answered - informed consent obtained and signed - see procedure note - f/u in 4 wks -- DFE/OCT, possible injection  2. Age related macular degeneration, non-exudative, OU  - intermediate stage with mild midzonal drusen - The incidence, anatomy, and pathology of dry AMD, risk of progression, and the AREDS and AREDS 2 studies including smoking risks discussed with patient. - Recommend amsler grid monitoring - f/u 3 months, DFE, OCT  3,4. Hypertensive retinopathy OU - discussed importance of tight BP control - monitor  5. Pseudophakia OU  - s/p CE/IOL OU  - 3-piece IOLs in good position, doing well  - monitor   Ophthalmic Meds Ordered this visit:  Meds ordered this encounter   Medications   Bevacizumab (AVASTIN) SOLN 1.25 mg     Return in about 4 weeks (around 12/10/2021) for f/u NPDR OU, DME OS -- DFE, OCT.  There are no Patient Instructions on file for this visit.   Explained the diagnoses, plan, and follow up with the patient and they expressed understanding.  Patient expressed understanding of the importance of proper follow up care.   This document serves as a record of services personally performed by Gardiner Sleeper, MD, PhD. It was created on their behalf by San Jetty. Owens Shark, OA an ophthalmic technician. The creation of this record is the provider's dictation and/or activities during the visit.    Electronically signed by: San Jetty. Owens Shark, [place] 02.15.2023 11:44 AM  Gardiner Sleeper, M.D., Ph.D. Diseases & Surgery of the Retina and Vitreous [facility]  I have reviewed the above documentation for accuracy and completeness, and I agree with the above. Gardiner Sleeper, M.D., Ph.D. 11/13/21 11:44 AM  Abbreviations: M myopia (nearsighted); A astigmatism; H hyperopia (farsighted); P presbyopia; Mrx spectacle prescription;  CTL contact lenses; OD right eye; OS left eye; OU both eyes  XT exotropia; ET esotropia; PEK punctate epithelial keratitis; PEE punctate epithelial erosions; DES dry eye syndrome; MGD meibomian gland dysfunction; ATs artificial tears; PFAT's  preservative free artificial tears; Sweeny nuclear sclerotic cataract; PSC posterior subcapsular cataract; ERM epi-retinal membrane; PVD posterior vitreous detachment; RD retinal detachment; DM diabetes mellitus; DR diabetic retinopathy; NPDR non-proliferative diabetic retinopathy; PDR proliferative diabetic retinopathy; CSME clinically significant macular edema; DME diabetic macular edema; dbh dot blot hemorrhages; CWS cotton wool spot; POAG primary open angle glaucoma; C/D cup-to-disc ratio; HVF humphrey visual field; GVF goldmann visual field; OCT optical coherence tomography; IOP  intraocular pressure; BRVO Branch retinal vein occlusion; CRVO central retinal vein occlusion; CRAO central retinal artery occlusion; BRAO branch retinal artery occlusion; RT retinal tear; SB scleral buckle; PPV pars plana vitrectomy; VH Vitreous hemorrhage; PRP panretinal laser photocoagulation; IVK intravitreal kenalog; VMT vitreomacular traction; MH Macular hole;  NVD neovascularization of the disc; NVE neovascularization elsewhere; AREDS age related eye disease study; ARMD age related macular degeneration; POAG primary open angle glaucoma; EBMD epithelial/anterior basement membrane dystrophy; ACIOL anterior chamber intraocular lens; IOL intraocular lens; PCIOL posterior chamber intraocular lens; Phaco/IOL phacoemulsification with intraocular lens placement; Guthrie photorefractive keratectomy; LASIK laser assisted in situ keratomileusis; HTN hypertension; DM diabetes mellitus; COPD chronic obstructive pulmonary disease

## 2021-11-08 ENCOUNTER — Telehealth: Payer: Self-pay | Admitting: Family Medicine

## 2021-11-08 NOTE — Telephone Encounter (Signed)
Called patient and she stated that she doesn't want to take last Rx prescribed to her and to send all [facility] she also requested a call from Dr. Salomon Fick personally.

## 2021-11-08 NOTE — Telephone Encounter (Signed)
Pt does not want to make an appt but would like dr banks to call her back concerning her medications

## 2021-11-11 NOTE — Telephone Encounter (Signed)
Pt using mail order pharmacy as preferred by her insurance company for chronic medications.  If patient chooses to use local pharmacy for chronic medications she may incur increased cost and only be given 30-day supply at a time.

## 2021-11-12 ENCOUNTER — Other Ambulatory Visit: Payer: Self-pay

## 2021-11-12 ENCOUNTER — Ambulatory Visit (INDEPENDENT_AMBULATORY_CARE_PROVIDER_SITE_OTHER): Payer: Medicare Other | Admitting: Ophthalmology

## 2021-11-12 ENCOUNTER — Encounter (INDEPENDENT_AMBULATORY_CARE_PROVIDER_SITE_OTHER): Payer: Self-pay | Admitting: Ophthalmology

## 2021-11-12 DIAGNOSIS — H353132 Nonexudative age-related macular degeneration, bilateral, intermediate dry stage: Secondary | ICD-10-CM

## 2021-11-12 DIAGNOSIS — I10 Essential (primary) hypertension: Secondary | ICD-10-CM | POA: Diagnosis not present

## 2021-11-12 DIAGNOSIS — Z961 Presence of intraocular lens: Secondary | ICD-10-CM | POA: Diagnosis not present

## 2021-11-12 DIAGNOSIS — H3581 Retinal edema: Secondary | ICD-10-CM

## 2021-11-12 DIAGNOSIS — H35033 Hypertensive retinopathy, bilateral: Secondary | ICD-10-CM | POA: Diagnosis not present

## 2021-11-12 DIAGNOSIS — E113213 Type 2 diabetes mellitus with mild nonproliferative diabetic retinopathy with macular edema, bilateral: Secondary | ICD-10-CM | POA: Diagnosis not present

## 2021-11-12 DIAGNOSIS — E113313 Type 2 diabetes mellitus with moderate nonproliferative diabetic retinopathy with macular edema, bilateral: Secondary | ICD-10-CM

## 2021-11-13 ENCOUNTER — Telehealth: Payer: Self-pay | Admitting: Interventional Cardiology

## 2021-11-13 ENCOUNTER — Encounter (INDEPENDENT_AMBULATORY_CARE_PROVIDER_SITE_OTHER): Payer: Self-pay | Admitting: Ophthalmology

## 2021-11-13 ENCOUNTER — Other Ambulatory Visit: Payer: Self-pay | Admitting: Interventional Cardiology

## 2021-11-13 ENCOUNTER — Telehealth: Payer: Self-pay | Admitting: Family Medicine

## 2021-11-13 MED ORDER — BEVACIZUMAB CHEMO INJECTION 1.25MG/0.05ML SYRINGE FOR KALEIDOSCOPE
1.2500 mg | INTRAVITREAL | Status: AC | PRN
  Administered 2021-11-13: 1.25 mg via INTRAVITREAL

## 2021-11-13 MED ORDER — TOPROL XL 25 MG PO TB24: ORAL_TABLET | ORAL | 0 refills | Status: DC

## 2021-11-13 NOTE — Telephone Encounter (Signed)
°*  STAT* If patient is at the pharmacy, call can be transferred to refill team.   1. Which medications need to be refilled? (please list name of each medication and dose if known) Toprol   2. Which pharmacy/location (including street and city if local pharmacy) is medication to be sent to?[facility] [address], [place]  3. Do they need a 30 day or 90 day supply? 90 days and refills

## 2021-11-13 NOTE — Telephone Encounter (Signed)
Pt c/o medication issue:  1. Name of Medication: TOPROL XL 25 MG 24 hr tablet  2. How are you currently taking this medication (dosage and times per day)? TAKE 2 TABS BY MOUTH EACH MORNING AND 1 TAB EACH EVENING  3. Are you having a reaction (difficulty breathing--STAT)? no  4. What is your medication issue? They need Prior auth for this medication

## 2021-11-13 NOTE — Telephone Encounter (Signed)
Tried calling patient to schedule Medicare Annual Wellness Visit (AWV) either virtually or in office.   No answer  Last AWV 07/24/20 please schedule at anytime with [facility] Nurse Health Advisor 1 or 2   This should be a 45 minute visit.

## 2021-11-14 ENCOUNTER — Other Ambulatory Visit: Payer: Self-pay

## 2021-11-14 MED ORDER — METOPROLOL SUCCINATE ER 50 MG PO TB24: 50.0000 mg | ORAL_TABLET | Freq: Every day | ORAL | 0 refills | Status: DC

## 2021-11-14 MED ORDER — METOPROLOL SUCCINATE ER 50 MG PO TB24: ORAL_TABLET | ORAL | 0 refills | Status: DC

## 2021-11-14 MED ORDER — METOPROLOL SUCCINATE ER 50 MG PO TB24: ORAL_TABLET | ORAL | 3 refills | Status: DC

## 2021-11-14 NOTE — Telephone Encounter (Signed)
**Note De-Identified  Obfuscation** The following message was received through the Toprol PA done through covermymeds: Decision Notes: Toprol XL is denied because it is not on your plan's Drug List (formulary). Medication authorization requires the following: (1) You need to try this covered drug: metoprolol succinate ER; OR (2) your doctor needs to give [place] specific medical reasons why the covered drug is not appropriate for you. Reviewed by: Vista Deck If the treating physician would like to discuss this coverage decision with the physician or health care professional reviewer, please call [employer] Prior Authorization department at [phone].  [facility] to make [facility] aware of this outcome as I have been unable to reach the pt.after multiple attempts. Per the pharmacist this PA is no longer required as the pts plan did pay for the new RX I sent to them for Toprol 50 mg-take 1 tablet in the AM and 1/2 tablet in the PM as the RX, written this way requires less tablets per month which is what ins plans prefer. The pharmacist advised me that she will advise the pt that there has been a change in her dose due to her ins plans coverage and will explain her new dose when she comes to pick up her refill.

## 2021-11-14 NOTE — Telephone Encounter (Signed)
° °  Pt is calling would like to ask why her Toprol increased to 50 mg, she is upset and wanted to speak with Dr. Katrinka Blazing. Phone# [phone]

## 2021-11-14 NOTE — Telephone Encounter (Signed)
Spoke with pt and she was upset that her Metoprolol had been changed to the 50mg  tablets.  Explained to pt that the insurance was denying the 25mg  tablets because it required a higher quantity.  Pt began screaming at me and was saying she can't swallow the bigger tablets.  Asked pt to stop yelling at me that I'm trying to help her.  She continued to scream at me about how [employer] was messing with her medications and they had no right.  I was trying to explain to her that we only changed the prescription due to their requirements and she continued to scream over me.  I asked pt to please stop screaming at me and just listen for a moment so I could explain everything to her.  She said she didn't need to hear it because she already knew everything and we shouldn't have changed her medication.  Pt hung up on me.  Called pt back and asked her to please listen so I could explain.  She started to scream at me again and I asked her to stop yelling again and was finally able to get her to calm down enough to let me explain.  Pt was much calmer from here on out.  She was still upset that the medication was changed but states she is going to call the insurance company and question why they were doing this and ask for an exception to get the smaller tablets since she doesn't handle the larger pills that well.  I did suggest breaking them in half and trying to swallow that way and she said she has tried that before in the past.  Pt thanked me for the assistance and apologized for yelling at me.  I told the pt that I understood her frustrations but I was only trying to help and yelling doesn't help the situation.  Pt agreed and thanked me again for speaking with her.

## 2021-11-14 NOTE — Telephone Encounter (Signed)
Patient calling to check on the status of her Prior Auth

## 2021-11-14 NOTE — Telephone Encounter (Signed)
**Note De-Identified  Obfuscation** Urgent Toprol PA done through covermymeds. Key: [id number]  I have called the pt multiple times but keep getting the following message: "your call cannot be completed at this time please try your call again later".

## 2021-11-25 ENCOUNTER — Telehealth: Payer: Self-pay | Admitting: Family Medicine

## 2021-11-25 NOTE — Telephone Encounter (Signed)
Pt is calling and would like dr banks to return her call concerning everything that has happen to her  ?

## 2021-11-27 NOTE — Telephone Encounter (Signed)
Call pt and she is aware that she need to make a appointment. ?

## 2021-12-01 ENCOUNTER — Other Ambulatory Visit: Payer: Self-pay | Admitting: Family Medicine

## 2021-12-01 DIAGNOSIS — E782 Mixed hyperlipidemia: Secondary | ICD-10-CM

## 2021-12-02 ENCOUNTER — Telehealth: Payer: Self-pay | Admitting: Interventional Cardiology

## 2021-12-02 NOTE — Telephone Encounter (Signed)
Attempted to contact the pt again and still receiving message that call cannot be completed at this time.   ? ?Tried daughter's number and it was busy.  ?

## 2021-12-02 NOTE — Telephone Encounter (Signed)
Attempted pt again and still receiving call cannot be completed as dialed message.  ?

## 2021-12-02 NOTE — Telephone Encounter (Signed)
Attempted to call pt back x 3 and received a message that call cannot be completed at this time.  ?

## 2021-12-02 NOTE — Telephone Encounter (Signed)
?  Pt c/o medication issue: ? ?1. Name of Medication:  ? ?metoprolol succinate (TOPROL-XL) 50 MG 24 hr tablet ? ?2. How are you currently taking this medication (dosage and times per day)?  ? ?Take 1 tablet (50 mg) in the AM and 1/2 tablet (25 mg) in the PM ? ?3. Are you having a reaction (difficulty breathing--STAT)?  No ?4. What is your medication issue?  ? ? Patient is coughing up blood and states that she wants to go back to the 25 mg. Please see phone note on 11/13/21  ? ?

## 2021-12-02 NOTE — Telephone Encounter (Signed)
Spoke with the patient's daughter and advised her to have her mother call us back.  ?

## 2021-12-03 ENCOUNTER — Telehealth: Payer: Self-pay | Admitting: Interventional Cardiology

## 2021-12-03 ENCOUNTER — Other Ambulatory Visit: Payer: Self-pay

## 2021-12-03 NOTE — Telephone Encounter (Signed)
**Note De-Identified  Obfuscation** I have called [phone] X 5 and continue to get a message stating that my call can not be completed at this time and to try my call later. ?Will attempt call again tomorrow. ? ? I attempted this Toprol quantity exception through [facility] (card was scanned into the pts chart on 11/12/21) at [phone] but per Sherita at [facility] the pt has no active coverage with them.  ?[facility] and was given the following info from the card the RX is being ran under as follows:  ?[employer] IN:[id number], BIN: [id number], PCN: [id number].  ? ?I called [employer] at the number provided by [facility] from the back of the pts card at [phone] and was advised by Daphanie that the pts coverage ended ith them on 11/30/21.  ?

## 2021-12-03 NOTE — Telephone Encounter (Signed)
?

## 2021-12-03 NOTE — Telephone Encounter (Signed)
Patient calling to report she has coughed up dark red, blood-tinged mucous twice in the last week. Patient advised to go to Urgent Care for evaluation. Patient declined and states she has an appt with her PCP Dr. Salomon Fick on Friday 12/06/21 and she will wait until then. ? ?Patient states she believes she is coughing up blood-tinged mucous from having to break her metoprolol in half and this is irritating her throat. She states she is not happy about the change in her metoprolol and is requesting a prescription for 25mg  tablets in addition to the 50mg  tablets she already has. ? ?Patient states her insurance faxed over forms to our office 12/02/21 for the metoprolol 25mg  tablets. ? ?Will forward to [place] [facility] to review. ?

## 2021-12-03 NOTE — Telephone Encounter (Signed)
See phone note from 3/14 ?

## 2021-12-04 DIAGNOSIS — Z20822 Contact with and (suspected) exposure to covid-19: Secondary | ICD-10-CM | POA: Diagnosis not present

## 2021-12-04 NOTE — Telephone Encounter (Signed)
**Note De-Identified  Obfuscation** I was able to reach the pt. ?She read the information aloud to me from the letter she received from [facility] and provided me the follow: ?ID: [id number] ?BIN: [id number] ?PCN: [id number] ?Phone number: [phone] ? ?I attempted a name brand Toprol quantity exception through covermymeds but was unable using this info or the info from the [facility] card in her chart from 11/12/21. ? ?I called the phone number provided by the pt and s/w Joni at the [facility] who checked multiple times per my request and advised me each time that I have called the correct number, that the pt has had coverage with them in the past but that she is currently not active. ? ?Joni recommended that the pt contact her benefits office. ? ?I called the pt to advise and she began talking loud and demanding "that someone has messed her up with this insurance mess and that the Lat plan is not a insurance plan anyway". ? ?I advised her to contact her benefits office or to call the phone number that she provided to me from the letter she received. ?I explained that this maybe a simple fix with a phone call and encouraged her to call [phone] to get a better understanding of what the issues is. ? ?She was speaking loudly over me but finally agreed to call them as I advised her that there is nothing I can do until this has been worked out because if the [employer] will not cover any of her medications until this is handles. ?She stated that she will call them. ?

## 2021-12-06 ENCOUNTER — Ambulatory Visit (INDEPENDENT_AMBULATORY_CARE_PROVIDER_SITE_OTHER): Payer: Medicare Other | Admitting: Family Medicine

## 2021-12-06 ENCOUNTER — Encounter: Payer: Self-pay | Admitting: Family Medicine

## 2021-12-06 VITALS — BP 182/84 | HR 77 | Temp 98.8°F | Wt 170.0 lb

## 2021-12-06 DIAGNOSIS — F0390 Unspecified dementia without behavioral disturbance: Secondary | ICD-10-CM

## 2021-12-06 DIAGNOSIS — I10 Essential (primary) hypertension: Secondary | ICD-10-CM | POA: Diagnosis not present

## 2021-12-06 DIAGNOSIS — E1140 Type 2 diabetes mellitus with diabetic neuropathy, unspecified: Secondary | ICD-10-CM | POA: Diagnosis not present

## 2021-12-06 DIAGNOSIS — F339 Major depressive disorder, recurrent, unspecified: Secondary | ICD-10-CM

## 2021-12-06 DIAGNOSIS — Z7189 Other specified counseling: Secondary | ICD-10-CM

## 2021-12-06 DIAGNOSIS — Z794 Long term (current) use of insulin: Secondary | ICD-10-CM

## 2021-12-06 LAB — POCT GLYCOSYLATED HEMOGLOBIN (HGB A1C): Hemoglobin A1C: 6.8 % — AB (ref 4.0–5.6)

## 2021-12-06 NOTE — Patient Instructions (Addendum)
Behavioral Health Services: ?-to make an appointment contact the office/provider you are interested in seeing.  No referral is needed.  The below is not an all inclusive list, but will help you get started. ? ?[email or web address] ?-counseling located off of [address]. ? ?[email or web address] ?-website helps you find providers in your area ? ?[facility] ?-Located off of [employer]. across from [employer] ? ?Dr. Jannifer Franklin is a Psychiatrist with [facility]. [phone] ? ?[facility] ? ?[facility]  ?-[employer]  [phone] ?-a place in town that has counseling and Psychiatry services.   ?And Lantus ? ?Patient never started taking Zoloft 25 mg daily.  Patient never started taking Zoloft 25 mg daily.  Current meds including losartan-hydrochlorothiazide 100-25 mg daily, Toprol XL 50 mg in a.m. and 25 mg p.m.  Current medication including losartan-hydrochlorothiazide 100-25 mg daily family involvement encouraged.  Continued family involvement encouraged.  Advanced directive counseling/discussion. ?

## 2021-12-06 NOTE — Progress Notes (Signed)
Subjective:  ? ? Patient ID: Karen Powell, female    DOB: 18-Dec-1935, 86 y.o.   MRN: [id number] ? ?Chief Complaint  ?Patient presents with  ? Follow-up  ?  BP, depression  ?Pt accompanied by her daughter Karen Powell. ? ?HPI ?Patient was seen today for f/u.  Pt frustrated pharmacy as she has had difficulty getting Toprol XL 25 mg tabs.  Patient typically takes Toprol-XL 50 mg in a.m. and 25 mg in p.m.  Felt like having to break 50 mg tabs in half was causing throat irritation.  Pt advised per chart review the cardiology is working to make sure she gets her medication.  Pt did not take Toprol XL today. ? ?Pt checking bs regularly which tends to vary.  Blood sugar readings 271, 137, 158, 87, 161, 177, 196, 143, 62, 79, 128, 263, 99, 55, 112, 196.  Patient states she is eating more vegetables, but feels like having cake and ice cream today.  States increased stress makes her blood sugars go up.  Taking NovoLog 15 units with meals and Lantus 57 units nightly. ? ?Pt never started Zoloft 25 mg daily.  States if she gets stressed she just looks at the bottle.  Pt's daughter tells pt if she is willing to go to counseling she will go with her and encouraged pt to consider taking zoloft.   ? ?Pt's family members check on her regularly to make sure she is taking her meds and eating. ? ?Past Medical History:  ?Diagnosis Date  ? Arthritis   ? Blood transfusion without reported diagnosis   ? Depression   ? Diabetic neuropathy (HCC)   ? Dizziness   ? Frequent headaches   ? Hyperlipidemia   ? Hypertension   ? Migraines   ? Palpitations   ? Urinary incontinence   ? ? ?Allergies  ?Allergen Reactions  ? Penicillins Anaphylaxis  ? ? ?ROS ?General: Denies fever, chills, night sweats, changes in weight, changes in appetite  +Dementia ?HEENT: Denies headaches, ear pain, changes in vision, rhinorrhea, sore throat ?CV: Denies CP, palpitations, SOB, orthopnea ?Pulm: Denies SOB, cough, wheezing ?GI: Denies abdominal pain, nausea, vomiting, diarrhea,  constipation ?GU: Denies dysuria, hematuria, frequency, vaginal discharge ?Msk: Denies muscle cramps, joint pains ?Neuro: Denies weakness, numbness, tingling ?Skin: Denies rashes, bruising ?Psych: Denies depression, anxiety, hallucinations  +anxiety and depression ? ?   ?Objective:  ?  ?Blood pressure (!) 182/84, pulse 77, temperature 98.8 ?F (37.1 ?C), temperature source Oral, weight 170 lb (77.1 kg), SpO2 99 %. ? ?Gen. Pleasant, well-nourished, in no distress, normal affect   ?HEENT: [place]/AT, face symmetric, conjunctiva clear, no scleral icterus, PERRLA, EOMI, nares patent without drainage ?Lungs: no accessory muscle use, CTAB, no wheezes or rales ?Cardiovascular: RRR, no m/r/g, no peripheral edema ?Abdomen: BS present, soft, NT/ND ?Neuro:  A&Ox3, CN II-XII intact, normal gait ?Skin:  Warm, no lesions/ rash ? ?Depression screen [facility] 2/9 12/06/2021 10/03/2021 05/09/2021  ?Decreased Interest 0 3 2  ?Down, Depressed, Hopeless 0 2 2  ?PHQ - 2 Score 0 5 4  ?Altered sleeping 0 1 -  ?Tired, decreased energy 0 0 2  ?Change in appetite 0 1 2  ?Feeling bad or failure about yourself  0 0 2  ?Trouble concentrating 0 1 0  ?Moving slowly or fidgety/restless 0 0 0  ?Suicidal thoughts 0 0 0  ?PHQ-9 Score 0 8 -  ?Difficult doing work/chores - - -  ? ?GAD 7 : Generalized Anxiety Score 12/06/2021 05/09/2021 11/10/2020  ?Nervous,  Anxious, on Edge 0 1 1  ?Control/stop worrying - 1 2  ?Worry too much - different things - 1 1  ?Trouble relaxing 0 1 1  ?Restless - 1 1  ?Easily annoyed or irritable - 1 2  ?Afraid - awful might happen 0 1 1  ?Total GAD 7 Score - 7 9  ?Anxiety Difficulty - - Somewhat difficult  ? ? ? ? ?Wt Readings from Last 3 Encounters:  ?12/06/21 170 lb (77.1 kg)  ?10/10/21 166 lb 12.8 oz (75.7 kg)  ?10/03/21 167 lb 6.4 oz (75.9 kg)  ? ? ?Lab Results  ?Component Value Date  ? WBC 5.0 02/20/2021  ? HGB 12.9 02/20/2021  ? HCT 39.1 02/20/2021  ? PLT 202.0 02/20/2021  ? GLUCOSE 433 (H) 02/20/2021  ? CHOL 154 02/20/2021  ? TRIG 126.0  02/20/2021  ? HDL 47.00 02/20/2021  ? LDLCALC 82 02/20/2021  ? ALT 16 02/20/2021  ? AST 20 02/20/2021  ? NA 141 02/20/2021  ? K 4.3 02/20/2021  ? CL 103 02/20/2021  ? CREATININE 0.99 02/20/2021  ? BUN 26 (H) 02/20/2021  ? CO2 19 02/20/2021  ? TSH 1.03 02/20/2021  ? HGBA1C 8.2 (A) 07/11/2021  ? ? ?Assessment/Plan: ? On day of service, 41 minutes spent caring for this patient face-to-face, reviewing the chart, counseling and/or coordinating care for plan and treatment of diagnosis below.   ? ?Essential hypertension ?-uncontrolled likely 2/2 not taking Toprol XL this am. ?-pt encouraged to restart medication.  Advised per chart review Cardiology trying to get pt her meds.  ?-continue current medications including Toprol XL 50 mg in am and 25 mg in pm, and losartan-HCTZ 100-25 mg daily ?-low sodium diet.  Increase po intake of water. ? ?Type 2 diabetes mellitus with diabetic neuropathy, with long-term current use of insulin (HCC)  ?-hgb A1C 8.2% 07/11/21 ?-hgb A1C this visit 6.8% ?-continue current medications. ?-Hesitant to adjust meds as would like to avoid confusion given history of dementia.  Continued family involvement encouraged. ?- Plan: POC HgB A1c ? ?Dementia without behavioral disturbance (HCC) ?-stable ?-consider formal neuropsych testing. ?-not currently on meds. ?-discussed HCPOA, POA, living will.  Given packet of info. ? ?Depression, recurrent (HCC) ?-pt hesitant to discuss.  Initially declined completing PHQ 9 and GAD 7. ?-GAD 7 and PHQ 9 scores likely lower 2/2 decreased pt cooperation and denial. ?-counseling encouraged.  Given info for area providers. ?-pt encouraged to consider trying Zoloft. ? ?Advanced directive counseling/discussion ?-given packet of info. ?-pt encouraged to have further discussion with her daughter/family members regarding wishes, HCPOA, POA. ? ?F/u in 2-3 months, sooner if needed. ? ?Abbe Amsterdam, MD ?

## 2021-12-09 NOTE — Progress Notes (Signed)
?[facility] Note ? ?12/10/2021 ? ?  ? ?CHIEF COMPLAINT ?Patient presents for Retina Follow Up ? ? ?HISTORY OF PRESENT ILLNESS: ?Karen Powell is a 86 y.o. female who presents to the clinic today for:  ? ?HPI   ? ? Retina Follow Up   ?Patient presents with  Diabetic Retinopathy.  In both eyes.  This started 4 weeks ago.  I, the attending physician,  performed the HPI with the patient and updated documentation appropriately. ? ?  ?  ? ? Comments   ?Patient here for 4 weeks retina follow up for NPDR OU. Patient states vision is fine. No eye pain.  ? ?  ?  ?Last edited by Bernarda Caffey, MD on 12/10/2021 10:20 PM.  ?  ? ?Pt states vision has improved since first injection, last A1c was 6.8 on 03.17.23 ? ?Referring physician: ?Billie Ruddy, MD ?[address] ?[place],  [place] [address] ? ?HISTORICAL INFORMATION:  ? ?Selected notes from the [facility] ?Referred by Dr. Lucianne Lei for mac edema ?LEE: 07/04/21 BCVA OD: 20/40 OS: 20/100 ?Ocular Hx- amblyopia, macular edema OS ?PMH- DM, HTN, migraines, high cholesterol; last a1c was 8.2 on 07/11/21 ?  ? ?CURRENT MEDICATIONS: ?Current Outpatient Medications (Ophthalmic Drugs)  ?Medication Sig  ? Polyethyl Glycol-Propyl Glycol (SYSTANE ULTRA OP) Apply 1 drop to eye daily.  ? ?No current facility-administered medications for this visit. (Ophthalmic Drugs)  ? ?Current Outpatient Medications (Other)  ?Medication Sig  ? Alcohol Swabs PADS 1 each by Does not apply route 3 (three) times daily. Used for checking Blood glucose at least 3 times daily  ? Apoaequorin (PREVAGEN PO) Take 1 capsule by mouth daily.  ? aspirin EC 81 MG tablet Take 1 tablet (81 mg total) by mouth daily.  ? BD PEN NEEDLE MICRO U/F 32G X 6 MM MISC USE TO ADMINISTER INSULIN THREE TIMES DAILY  ? Cetirizine HCl (ZYRTEC ALLERGY PO) Take 1 tablet by mouth daily.  ? Coenzyme Q10 (COQ10) 200 MG CAPS Take 1 capsule by mouth daily.  ? Continuous Blood Gluc Sensor (FREESTYLE LIBRE 2  SENSOR) MISC Use to check blood sugars.  ? esomeprazole (NEXIUM) 20 MG capsule Take 20 mg by mouth daily at 12 noon.  ? insulin aspart (NOVOLOG FLEXPEN) 100 UNIT/ML FlexPen INJECT 15 UNITS INTO THE SKIN 3 (THREE) TIMES DAILY WITH MEALS.  ? insulin glargine (LANTUS SOLOSTAR) 100 UNIT/ML Solostar Pen Inject 57 Units into the skin at bedtime.  ? losartan-hydrochlorothiazide (HYZAAR) 100-25 MG tablet Take 1 tablet by mouth daily.  ? metFORMIN (GLUCOPHAGE) 500 MG tablet Take 1 tablet (500 mg total) by mouth 2 (two) times daily.  ? Multiple Vitamins-Minerals (CENTRUM PO) Take 1 tablet by mouth daily.  ? Naproxen Sodium (ALEVE PO) Take 2 tablets by mouth daily as needed (pain).   ? Omega-3 Fatty Acids (OMEGA 3 PO) Take 1 capsule by mouth daily.  ? Probiotic Product (PROBIOTIC PO) Take 1 capsule by mouth at bedtime.  ? rosuvastatin (CRESTOR) 20 MG tablet TAKE 1 TABLET BY MOUTH EVERYDAY AT BEDTIME  ? Sennosides-Docusate Sodium (SENOKOT S PO) Take 1 tablet by mouth 2 (two) times daily.  ? Turmeric 500 MG TABS Take 1 tablet by mouth daily.  ? ZOLOFT 25 MG tablet Take 1 tablet (25 mg total) by mouth daily. (Patient not taking: Reported on 12/06/2021)  ? ?No current facility-administered medications for this visit. (Other)  ? ?REVIEW OF SYSTEMS: ?ROS   ?Positive for: Endocrine, Cardiovascular,  Eyes ?Negative for: Constitutional, Gastrointestinal, Neurological, Skin, Genitourinary, Musculoskeletal, HENT, Respiratory, Psychiatric, Allergic/Imm, Heme/Lymph ?Last edited by Theodore Demark, COA on 12/10/2021  1:59 PM.  ?  ? ? ?ALLERGIES ?Allergies  ?Allergen Reactions  ? Penicillins Anaphylaxis  ? ?PAST MEDICAL HISTORY ?Past Medical History:  ?Diagnosis Date  ? Arthritis   ? Blood transfusion without reported diagnosis   ? Depression   ? Diabetic neuropathy ([place])   ? Dizziness   ? Frequent headaches   ? Hyperlipidemia   ? Hypertension   ? Migraines   ? Palpitations   ? Urinary incontinence   ? ?Past Surgical History:  ?Procedure  Laterality Date  ? none    ? ?FAMILY HISTORY ?Family History  ?Problem Relation Age of Onset  ? Diabetes Mother   ? Hypertension Mother   ? Diabetes Maternal Grandmother   ? Diabetes Maternal Grandfather   ? ?SOCIAL HISTORY ?Social History  ? ?Tobacco Use  ? Smoking status: Never  ? Smokeless tobacco: Never  ?Vaping Use  ? Vaping Use: Never used  ?Substance Use Topics  ? Alcohol use: Yes  ? Drug use: No  ?  ? ?  ?OPHTHALMIC EXAM: ? ?Base Eye Exam   ? ? Visual Acuity (Snellen - Linear)   ? ?   Right Left  ? Dist cc 20/50 20/100 +1  ? Dist ph cc 20/30 -1 20/80  ? ?  ?  ? ? Tonometry (Tonopen, 1:57 PM)   ? ?   Right Left  ? Pressure 13 15  ? ?  ?  ? ? Pupils   ? ?   Dark Light Shape React APD  ? Right 4 3 Round Brisk None  ? Left 4 3 Round Brisk None  ? ?  ?  ? ? Visual Fields (Counting fingers)   ? ?   Left Right  ?  Full Full  ? ?  ?  ? ? Extraocular Movement   ? ?   Right Left  ?  Full, Ortho Full, Ortho  ? ?  ?  ? ? Neuro/Psych   ? ? Oriented x3: Yes  ? Mood/Affect: Normal  ? ?  ?  ? ? Dilation   ? ? Both eyes: 1.0% Mydriacyl, 2.5% Phenylephrine @ 1:56 PM  ? ?  ?  ? ?  ? ?Slit Lamp and Fundus Exam   ? ? Slit Lamp Exam   ? ?   Right Left  ? Lids/Lashes Dermatochalasis - upper lid Dermatochalasis - upper lid  ? Conjunctiva/Sclera nasal and temporal pinguecula, mild melanosis nasal and temporal pinguecula, mild melanosis  ? Cornea 2+ Punctate epithelial erosions, mild arcus 2+ Punctate epithelial erosions, mild arcus  ? Anterior Chamber Deep and quiet Deep and quiet  ? Iris Round and dilated Round and dilated  ? Lens 3 piece PC IOL, open PC 3 piece PC IOL, open PC  ? Anterior Vitreous Vitreous syneresis, Posterior vitreous detachment, mild Asteroid hyalosis inferiorly Vitreous syneresis, Posterior vitreous detachment  ? ?  ?  ? ? Fundus Exam   ? ?   Right Left  ? Disc Pink and Sharp, mild PPA/PPP Pink and Sharp, mild temporal PPA  ? C/D Ratio 0.3 0.5  ? Macula Flat, Good foveal reflex, rare MA, cluster of drusen ST  mac Blunted foveal reflex, central cystic changes - persistent, +MA, pigmented CR scarring, mild ERM temporal macula  ? Vessels attenuated, Tortuous attenuated, mild tortuosity, mild Copper wiring  ? Periphery Attached, mild  midzonal drusen, rare MA Attached, mild midzonal drusen, mild reticular degeneration, rare MA  ? ?  ?  ? ?  ? ?Refraction   ? ? Wearing Rx   ? ?   Sphere Cylinder Axis Add  ? Right -0.50 Sphere  +2.50  ? Left -0.50 +0.50 090 +2.50  ? ?  ?  ? ?  ? ? ?IMAGING AND PROCEDURES  ?Imaging and Procedures for 12/10/2021 ? ?OCT, Retina - OU - Both Eyes   ? ?   ?Right Eye ?Quality was good. Central Foveal Thickness: 249. Progression has been stable. Findings include normal foveal contour, no IRF, no SRF (Rare drusen).  ? ?Left Eye ?Quality was good. Central Foveal Thickness: 393. Progression has worsened. Findings include abnormal foveal contour, intraretinal fluid, intraretinal hyper-reflective material, no SRF (Persistent central IRF/central cyst -- slightly increased).  ? ?Notes ?*Images captured and stored on drive ? ?Diagnosis / Impression:  ?OD: NFP; no IRF/SRF ?OS: Persistent central IRF/central cyst -- slightly increased ? ? ?Clinical management:  ?See below ? ?Abbreviations: NFP - Normal foveal profile. CME - cystoid macular edema. PED - pigment epithelial detachment. IRF - intraretinal fluid. SRF - subretinal fluid. EZ - ellipsoid zone. ERM - epiretinal membrane. ORA - outer retinal atrophy. ORT - outer retinal tubulation. SRHM - subretinal hyper-reflective material. IRHM - intraretinal hyper-reflective material ? ? ?  ? ?Intravitreal Injection, Pharmacologic Agent - OS - Left Eye   ? ?   ?Time Out ?12/10/2021. 2:48 PM. Confirmed correct patient, procedure, site, and patient consented.  ? ?Anesthesia ?Topical anesthesia was used. Anesthetic medications included Lidocaine 2%, Proparacaine 0.5%.  ? ?Procedure ?Preparation included 5% betadine to ocular surface, eyelid speculum. A (32g) needle was  used.  ? ?Injection: ?1.25 mg Bevacizumab 1.42m/0.05ml ?  Route: Intravitreal, Site: Left Eye ?  [place] 523300-762-26 Lot:: [id number] Expiration date: 12/30/2021  ? ?Post-op ?Post injection exam found visual acuity

## 2021-12-10 ENCOUNTER — Other Ambulatory Visit: Payer: Self-pay

## 2021-12-10 ENCOUNTER — Ambulatory Visit (INDEPENDENT_AMBULATORY_CARE_PROVIDER_SITE_OTHER): Payer: Medicare Other | Admitting: Ophthalmology

## 2021-12-10 ENCOUNTER — Encounter (INDEPENDENT_AMBULATORY_CARE_PROVIDER_SITE_OTHER): Payer: Self-pay | Admitting: Ophthalmology

## 2021-12-10 DIAGNOSIS — I10 Essential (primary) hypertension: Secondary | ICD-10-CM | POA: Diagnosis not present

## 2021-12-10 DIAGNOSIS — H35033 Hypertensive retinopathy, bilateral: Secondary | ICD-10-CM | POA: Diagnosis not present

## 2021-12-10 DIAGNOSIS — E113213 Type 2 diabetes mellitus with mild nonproliferative diabetic retinopathy with macular edema, bilateral: Secondary | ICD-10-CM

## 2021-12-10 DIAGNOSIS — H353132 Nonexudative age-related macular degeneration, bilateral, intermediate dry stage: Secondary | ICD-10-CM

## 2021-12-10 DIAGNOSIS — Z961 Presence of intraocular lens: Secondary | ICD-10-CM

## 2021-12-10 MED ORDER — BEVACIZUMAB CHEMO INJECTION 1.25MG/0.05ML SYRINGE FOR KALEIDOSCOPE
1.2500 mg | INTRAVITREAL | Status: AC | PRN
  Administered 2021-12-10: 1.25 mg via INTRAVITREAL

## 2021-12-11 DIAGNOSIS — Z20822 Contact with and (suspected) exposure to covid-19: Secondary | ICD-10-CM | POA: Diagnosis not present

## 2021-12-23 ENCOUNTER — Telehealth: Payer: Self-pay | Admitting: Interventional Cardiology

## 2021-12-23 ENCOUNTER — Telehealth: Payer: Self-pay | Admitting: Family Medicine

## 2021-12-23 NOTE — Telephone Encounter (Signed)
Karen Powell is calling from [employer] wanting to know if our office received the penalty acceptance form that was sent over today. She reports the patient is very worried about it and the patient should be called back for the confirmation on this. Please advise.  ?

## 2021-12-23 NOTE — Telephone Encounter (Signed)
I spoke with patient to schedule awv.  She stated she has been trying to get in touch with office.  She needs a letter stating she is unable to stay by her self.  She stated her rent is $1600 a month.  She stated she needs to be around people now and can stay with granddaughter.   ?

## 2021-12-23 NOTE — Telephone Encounter (Signed)
Spoke with pt and let her know that I have not received anything from [employer] as of yet.  She said they faxed something over at 2pm.  Advised I checked the fax machine and did not see anything at this time.  Pt states she will call them as soon as we hang up and have them refax it.  She did provide me with the numbers: ? ?Phone- [phone] [facility] ?Fax- [phone] ? ?Pt appreciative for call and assistance.  ?

## 2021-12-24 ENCOUNTER — Telehealth: Payer: Self-pay

## 2021-12-24 ENCOUNTER — Other Ambulatory Visit: Payer: Self-pay | Admitting: Family Medicine

## 2021-12-24 DIAGNOSIS — E1140 Type 2 diabetes mellitus with diabetic neuropathy, unspecified: Secondary | ICD-10-CM

## 2021-12-24 NOTE — Telephone Encounter (Signed)
This nurse attempted to call patient three times for telephonic AWV. There was no answer and no voicemail to leave a message. ?

## 2021-12-25 NOTE — Telephone Encounter (Signed)
Spoke with Cathie Hoops at [facility] and she said the reason we haven't received the fax is because they are still working on getting it sent.  She advised it should be sent to our office soon.  ?

## 2021-12-25 NOTE — Telephone Encounter (Signed)
Can you contact family and see what is going with this?  However, I do agree that pt would benefit from not living alone. ?

## 2021-12-25 NOTE — Telephone Encounter (Signed)
Called pt and updated her on situation.  Pt appreciative for call and will contact the company again to try to get this expedited.   ?

## 2021-12-30 ENCOUNTER — Encounter: Payer: Self-pay | Admitting: Family Medicine

## 2021-12-30 ENCOUNTER — Telehealth (INDEPENDENT_AMBULATORY_CARE_PROVIDER_SITE_OTHER): Payer: Medicare Other | Admitting: Family Medicine

## 2021-12-30 DIAGNOSIS — F0390 Unspecified dementia without behavioral disturbance: Secondary | ICD-10-CM

## 2021-12-30 LAB — HM DIABETES EYE EXAM

## 2021-12-30 NOTE — Progress Notes (Signed)
Virtual Visit via Telephone Note ? ?I connected with Karen Powell on 12/30/21 at  3:30 PM EDT by telephone and verified that I am speaking with the correct person using two identifiers. ?  ?I discussed the limitations, risks, security and privacy concerns of performing an evaluation and management service by telephone and the availability of in person appointments. I also discussed with the patient that there may be a patient responsible charge related to this service. The patient expressed understanding and agreed to proceed. ? ?Location patient: home ?Location provider: work or home office ?Participants present for the call: patient, provider ?Patient did not have a visit in the prior 7 days to address this/these issue(s). ? ?Chief Complaint  ?Patient presents with  ? Personal Problem  ? ? ?History of Present Illness: ?Pt states she feels good.  States she wants her meds changed from [facility] back to [facility].  In the past meds were sent to mail order pharmacy 2/2 insurance preference. ? ?Pt wants to live in town with her daughter, Bronson Ing.  Pt feels like is will be closer to activity and buses so she will have something to do and improve her mood.  Pt states she will have a shower and a bedroom on the first floor.  Pt requesting a letter be sent to her stating she can no longer live by herself.  States she asked for a 6 month lease, but was given a 1 yr lease.  Pt currently lives at [facility] park? ? ?Patient recalls stories from when she worked at [employer] in [place].  Also recalls stories from when she lived in [place]. ?  ?Observations/Objective: ?Patient sounds cheerful and well on the phone. ?I do not appreciate any SOB. ?Speech and thought processing are grossly intact. ?Patient reported vitals: ? ?Assessment and Plan: ?Dementia without behavioral disturbance (HCC) ?-Reviewed patient's concerns. ?-[employer] may not cover cost of chronic medications if sent to local pharmacy instead of mail  order pharmacy. ?-Also discussed contacting patient's family to inquire about patient moving in her current lease.  If needed can provide a letter stating patient would benefit from living with someone.   ? ? ?Follow Up Instructions: ?F/u prn in the next few months. ? ?99441 5-10 ?99442 11-20 ?9443 21-30 ?I did not refer this patient for an OV in the next 24 hours for this/these issue(s). ? ?I discussed the assessment and treatment plan with the patient. The patient was provided an opportunity to ask questions and all were answered. The patient agreed with the plan and demonstrated an understanding of the instructions. ?  ?The patient was advised to call back or seek an in-person evaluation if the symptoms worsen or if the condition fails to improve as anticipated. ? ?I provided 19 minutes of non-face-to-face time during this encounter. ? ? ?Deeann Saint, MD ? ?

## 2021-12-31 NOTE — Telephone Encounter (Signed)
**Note De-Identified  Obfuscation** I did receive the Toprol XL Brand Penalty Exception request form from [employer]. ?I have completed it and have e-mailed it to Dr Thompson Caul nurse so she can obtain his signature and to fax to [place] at the fax number written on the cover letter included. ?

## 2021-12-31 NOTE — Telephone Encounter (Signed)
Paperwork signed and faxed.

## 2022-01-02 ENCOUNTER — Telehealth: Payer: Self-pay | Admitting: Family Medicine

## 2022-01-02 DIAGNOSIS — H53003 Unspecified amblyopia, bilateral: Secondary | ICD-10-CM | POA: Diagnosis not present

## 2022-01-02 DIAGNOSIS — H524 Presbyopia: Secondary | ICD-10-CM | POA: Diagnosis not present

## 2022-01-02 DIAGNOSIS — H3581 Retinal edema: Secondary | ICD-10-CM | POA: Diagnosis not present

## 2022-01-02 DIAGNOSIS — H04123 Dry eye syndrome of bilateral lacrimal glands: Secondary | ICD-10-CM | POA: Diagnosis not present

## 2022-01-02 NOTE — Telephone Encounter (Signed)
Left message for patient to call back and schedule Medicare Annual Wellness Visit (AWV) either virtually or in office. Left  my Karen Powell number [phone] ? ? ?Last AWV 07/24/20 ? please schedule at anytime with [facility] Nurse Health Advisor 1 or 2 ? ? ?

## 2022-01-03 NOTE — Progress Notes (Signed)
?[facility] Note ? ?01/07/2022 ? ?  ? ?CHIEF COMPLAINT ?Patient presents for Retina Follow Up ? ? ?HISTORY OF PRESENT ILLNESS: ?Karen Powell is a 86 y.o. female who presents to the clinic today for:  ? ?HPI   ? ? Retina Follow Up   ?Patient presents with  Diabetic Retinopathy.  In both eyes.  Severity is moderate.  Duration of 4 weeks.  Since onset it is stable.  I, the attending physician,  performed the HPI with the patient and updated documentation appropriately. ? ?  ?  ? ? Comments   ?Patient states vision the same OU. BS was 174 in office. Last a1c was 6.8 on 03.17.23.  ? ?  ?  ?Last edited by Bernarda Caffey, MD on 01/07/2022  9:59 PM.  ?  ?Pt states vision is the same ? ?Referring physician: ?Billie Ruddy, MD ?[address] ?[place],  [place] [address] ? ?HISTORICAL INFORMATION:  ? ?Selected notes from the [facility] ?Referred by Dr. Lucianne Lei for mac edema ?LEE: 07/04/21 BCVA OD: 20/40 OS: 20/100 ?Ocular Hx- amblyopia, macular edema OS ?PMH- DM, HTN, migraines, high cholesterol; last a1c was 8.2 on 07/11/21 ?  ? ?CURRENT MEDICATIONS: ?Current Outpatient Medications (Ophthalmic Drugs)  ?Medication Sig  ? Polyethyl Glycol-Propyl Glycol (SYSTANE ULTRA OP) Apply 1 drop to eye daily.  ? ?No current facility-administered medications for this visit. (Ophthalmic Drugs)  ? ?Current Outpatient Medications (Other)  ?Medication Sig  ? Alcohol Swabs PADS 1 each by Does not apply route 3 (three) times daily. Used for checking Blood glucose at least 3 times daily  ? Apoaequorin (PREVAGEN PO) Take 1 capsule by mouth daily.  ? aspirin EC 81 MG tablet Take 1 tablet (81 mg total) by mouth daily.  ? BD PEN NEEDLE MICRO U/F 32G X 6 MM MISC USE TO ADMINISTER INSULIN THREE TIMES DAILY  ? Cetirizine HCl (ZYRTEC ALLERGY PO) Take 1 tablet by mouth daily.  ? Coenzyme Q10 (COQ10) 200 MG CAPS Take 1 capsule by mouth daily.  ? Continuous Blood Gluc Sensor (FREESTYLE LIBRE 2 SENSOR) MISC Use to  check blood sugars.  ? esomeprazole (NEXIUM) 20 MG capsule Take 20 mg by mouth daily at 12 noon.  ? insulin glargine (LANTUS SOLOSTAR) 100 UNIT/ML Solostar Pen Inject 57 Units into the skin at bedtime.  ? losartan-hydrochlorothiazide (HYZAAR) 100-25 MG tablet Take 1 tablet by mouth daily.  ? metFORMIN (GLUCOPHAGE) 500 MG tablet Take 1 tablet (500 mg total) by mouth 2 (two) times daily.  ? Multiple Vitamins-Minerals (CENTRUM PO) Take 1 tablet by mouth daily.  ? Naproxen Sodium (ALEVE PO) Take 2 tablets by mouth daily as needed (pain).   ? NOVOLOG FLEXPEN 100 UNIT/ML FlexPen INJECT 15 UNITS INTO THE SKIN 3 (THREE) TIMES DAILY WITH MEALS.  ? Omega-3 Fatty Acids (OMEGA 3 PO) Take 1 capsule by mouth daily.  ? Probiotic Product (PROBIOTIC PO) Take 1 capsule by mouth at bedtime.  ? rosuvastatin (CRESTOR) 20 MG tablet TAKE 1 TABLET BY MOUTH EVERYDAY AT BEDTIME  ? Sennosides-Docusate Sodium (SENOKOT S PO) Take 1 tablet by mouth 2 (two) times daily.  ? Turmeric 500 MG TABS Take 1 tablet by mouth daily.  ? ZOLOFT 25 MG tablet Take 1 tablet (25 mg total) by mouth daily.  ? ?No current facility-administered medications for this visit. (Other)  ? ?REVIEW OF SYSTEMS: ?ROS   ?Positive for: Endocrine, Cardiovascular, Eyes ?Negative for: Constitutional, Gastrointestinal, Neurological, Skin, Genitourinary, Musculoskeletal,  HENT, Respiratory, Psychiatric, Allergic/Imm, Heme/Lymph ?Last edited by Roselee Nova D, COT on 01/07/2022  1:58 PM.  ?  ? ? ? ?ALLERGIES ?Allergies  ?Allergen Reactions  ? Penicillins Anaphylaxis  ? ?PAST MEDICAL HISTORY ?Past Medical History:  ?Diagnosis Date  ? Arthritis   ? Blood transfusion without reported diagnosis   ? Depression   ? Diabetic neuropathy ([place])   ? Dizziness   ? Frequent headaches   ? Hyperlipidemia   ? Hypertension   ? Migraines   ? Palpitations   ? Urinary incontinence   ? ?Past Surgical History:  ?Procedure Laterality Date  ? none    ? ?FAMILY HISTORY ?Family History  ?Problem Relation Age  of Onset  ? Diabetes Mother   ? Hypertension Mother   ? Diabetes Maternal Grandmother   ? Diabetes Maternal Grandfather   ? ?SOCIAL HISTORY ?Social History  ? ?Tobacco Use  ? Smoking status: Never  ? Smokeless tobacco: Never  ?Vaping Use  ? Vaping Use: Never used  ?Substance Use Topics  ? Alcohol use: Yes  ? Drug use: No  ?  ? ?  ?OPHTHALMIC EXAM: ? ?Base Eye Exam   ? ? Visual Acuity (Snellen - Linear)   ? ?   Right Left  ? Dist cc 20/30 -2 20/100 +1  ? Dist ph cc 20/25 NI  ? ? Correction: Glasses  ? ?  ?  ? ? Tonometry (Tonopen, 2:15 PM)   ? ?   Right Left  ? Pressure 15 17  ? ?  ?  ? ? Pupils   ? ?   Dark Light Shape React APD  ? Right 4 3 Round Brisk None  ? Left 4 3 Round Brisk None  ? ?  ?  ? ? Visual Fields (Counting fingers)   ? ?   Left Right  ?  Full Full  ? ?  ?  ? ? Extraocular Movement   ? ?   Right Left  ?  Full, Ortho Full, Ortho  ? ?  ?  ? ? Neuro/Psych   ? ? Oriented x3: Yes  ? Mood/Affect: Normal  ? ?  ?  ? ? Dilation   ? ? Both eyes: 1.0% Mydriacyl, 2.5% Phenylephrine @ 2:15 PM  ? ?  ?  ? ?  ? ?Slit Lamp and Fundus Exam   ? ? Slit Lamp Exam   ? ?   Right Left  ? Lids/Lashes Dermatochalasis - upper lid Dermatochalasis - upper lid  ? Conjunctiva/Sclera nasal and temporal pinguecula, mild melanosis nasal and temporal pinguecula, mild melanosis  ? Cornea 2+ Punctate epithelial erosions, mild arcus 2+ Punctate epithelial erosions, mild arcus  ? Anterior Chamber Deep and quiet Deep and quiet  ? Iris Round and dilated Round and dilated  ? Lens 3 piece PC IOL, open PC 3 piece PC IOL, open PC  ? Anterior Vitreous Vitreous syneresis, Posterior vitreous detachment, mild Asteroid hyalosis inferiorly Vitreous syneresis, Posterior vitreous detachment  ? ?  ?  ? ? Fundus Exam   ? ?   Right Left  ? Disc Pink and Sharp, mild PPA/PPP Pink and Sharp, mild temporal PPA  ? C/D Ratio 0.3 0.5  ? Macula Flat, Good foveal reflex, rare MA, cluster of drusen ST mac Blunted foveal reflex, central cystic changes - slightly  improved, +MA, pigmented CR scarring, mild ERM temporal macula  ? Vessels attenuated, Tortuous attenuated, mild tortuosity, mild Copper wiring  ? Periphery Attached, mild midzonal drusen,  rare MA Attached, mild midzonal drusen, mild reticular degeneration, rare MA  ? ?  ?  ? ?  ? ?Refraction   ? ? Wearing Rx   ? ?   Sphere Cylinder Axis Add  ? Right -0.50 Sphere  +2.50  ? Left -0.50 +0.50 090 +2.50  ? ?  ?  ? ? Manifest Refraction   ? ?   Sphere Cylinder Axis Dist VA  ? Right -1.00 Sphere  20/25-1  ? Left -0.50 +0.50 085 20/80+2  ? ?  ?  ? ?  ? ? ?IMAGING AND PROCEDURES  ?Imaging and Procedures for 01/07/2022 ? ?OCT, Retina - OU - Both Eyes   ? ?   ?Right Eye ?Quality was good. Central Foveal Thickness: 249. Progression has been stable. Findings include normal foveal contour, no IRF, no SRF (Rare drusen).  ? ?Left Eye ?Quality was good. Central Foveal Thickness: 362. Progression has improved. Findings include abnormal foveal contour, intraretinal fluid, intraretinal hyper-reflective material, no SRF (Mild interval improvement in IRF temporal fovea).  ? ?Notes ?*Images captured and stored on drive ? ?Diagnosis / Impression:  ?OD: NFP; no IRF/SRF ?OS: Mild interval improvement in IRF temporal fovea ? ? ?Clinical management:  ?See below ? ?Abbreviations: NFP - Normal foveal profile. CME - cystoid macular edema. PED - pigment epithelial detachment. IRF - intraretinal fluid. SRF - subretinal fluid. EZ - ellipsoid zone. ERM - epiretinal membrane. ORA - outer retinal atrophy. ORT - outer retinal tubulation. SRHM - subretinal hyper-reflective material. IRHM - intraretinal hyper-reflective material ? ? ?  ? ?Intravitreal Injection, Pharmacologic Agent - OS - Left Eye   ? ?   ?Time Out ?01/07/2022. 3:05 PM. Confirmed correct patient, procedure, site, and patient consented.  ? ?Anesthesia ?Topical anesthesia was used. Anesthetic medications included Lidocaine 2%, Proparacaine 0.5%.  ? ?Procedure ?Preparation included 5% betadine  to ocular surface, eyelid speculum. A (32g) needle was used.  ? ?Injection: ?1.25 mg Bevacizumab 1.57m/0.05ml ?  Route: Intravitreal, Site: Left Eye ?  N[place] [id number] Lot:: [id number] Expiration date: 6/26

## 2022-01-07 ENCOUNTER — Ambulatory Visit (INDEPENDENT_AMBULATORY_CARE_PROVIDER_SITE_OTHER): Payer: Medicare Other | Admitting: Ophthalmology

## 2022-01-07 ENCOUNTER — Encounter (INDEPENDENT_AMBULATORY_CARE_PROVIDER_SITE_OTHER): Payer: Self-pay | Admitting: Ophthalmology

## 2022-01-07 DIAGNOSIS — I10 Essential (primary) hypertension: Secondary | ICD-10-CM | POA: Diagnosis not present

## 2022-01-07 DIAGNOSIS — Z961 Presence of intraocular lens: Secondary | ICD-10-CM

## 2022-01-07 DIAGNOSIS — E113213 Type 2 diabetes mellitus with mild nonproliferative diabetic retinopathy with macular edema, bilateral: Secondary | ICD-10-CM

## 2022-01-07 DIAGNOSIS — H353132 Nonexudative age-related macular degeneration, bilateral, intermediate dry stage: Secondary | ICD-10-CM

## 2022-01-07 DIAGNOSIS — H35033 Hypertensive retinopathy, bilateral: Secondary | ICD-10-CM | POA: Diagnosis not present

## 2022-01-07 MED ORDER — BEVACIZUMAB CHEMO INJECTION 1.25MG/0.05ML SYRINGE FOR KALEIDOSCOPE
1.2500 mg | INTRAVITREAL | Status: AC | PRN
  Administered 2022-01-07: 1.25 mg via INTRAVITREAL

## 2022-01-07 NOTE — Telephone Encounter (Signed)
Called alternate contact, no answer. Left VM to call office. ?

## 2022-01-09 DIAGNOSIS — Z20822 Contact with and (suspected) exposure to covid-19: Secondary | ICD-10-CM | POA: Diagnosis not present

## 2022-01-09 NOTE — Telephone Encounter (Signed)
Pt have virtual visit, and discussed the below. ?

## 2022-01-10 ENCOUNTER — Ambulatory Visit (INDEPENDENT_AMBULATORY_CARE_PROVIDER_SITE_OTHER): Payer: Medicare Other

## 2022-01-10 VITALS — Ht 63.0 in | Wt 170.0 lb

## 2022-01-10 DIAGNOSIS — Z0000 Encounter for general adult medical examination without abnormal findings: Secondary | ICD-10-CM | POA: Diagnosis not present

## 2022-01-10 NOTE — Progress Notes (Signed)
?I connected with [name] today by telephone and verified that I am speaking with the correct person using two identifiers. ?Location patient: home ?Location provider: work ?Persons participating in the virtual visit: Lyndsi, Altic LPN. ?  ?I discussed the limitations, risks, security and privacy concerns of performing an evaluation and management service by telephone and the availability of in person appointments. I also discussed with the patient that there may be a patient responsible charge related to this service. The patient expressed understanding and verbally consented to this telephonic visit.  ?  ?Interactive audio and video telecommunications were attempted between this provider and patient, however failed, due to patient having technical difficulties OR patient did not have access to video capability.  We continued and completed visit with audio only. ? ?  ? ?Vital signs may be patient reported or missing. ? ?Subjective:  ? Karen Powell is a 86 y.o. female who presents for Medicare Annual (Subsequent) preventive examination. ? ?Review of Systems    ? ?Cardiac Risk Factors include: advanced age (>62men, >75 women);diabetes mellitus;dyslipidemia;hypertension;obesity (BMI >30kg/m2) ? ?   ?Objective:  ?  ?Today's Vitals  ? 01/10/22 1542  ?Weight: 170 lb (77.1 kg)  ?Height: 5\' 3"  (1.6 m)  ? ?Body mass index is 30.11 kg/m?. ? ? ?  01/10/2022  ?  3:50 PM 07/24/2020  ? 11:48 AM  ?Advanced Directives  ?Does Patient Have a Medical Advance Directive? No No  ?Does patient want to make changes to medical advance directive?  No - Patient declined  ? ? ?Current Medications (verified) ?Outpatient Encounter Medications as of 01/10/2022  ?Medication Sig  ? Apoaequorin (PREVAGEN PO) Take 1 capsule by mouth daily.  ? aspirin EC 81 MG tablet Take 1 tablet (81 mg total) by mouth daily.  ? BD PEN NEEDLE MICRO U/F 32G X 6 MM MISC USE TO ADMINISTER INSULIN THREE TIMES DAILY  ? Cetirizine HCl (ZYRTEC ALLERGY  PO) Take 1 tablet by mouth daily.  ? Coenzyme Q10 (COQ10) 200 MG CAPS Take 1 capsule by mouth daily.  ? Continuous Blood Gluc Sensor (FREESTYLE LIBRE 2 SENSOR) MISC Use to check blood sugars.  ? esomeprazole (NEXIUM) 20 MG capsule Take 20 mg by mouth daily at 12 noon.  ? insulin glargine (LANTUS SOLOSTAR) 100 UNIT/ML Solostar Pen Inject 57 Units into the skin at bedtime.  ? losartan-hydrochlorothiazide (HYZAAR) 100-25 MG tablet Take 1 tablet by mouth daily.  ? metFORMIN (GLUCOPHAGE) 500 MG tablet Take 1 tablet (500 mg total) by mouth 2 (two) times daily.  ? Multiple Vitamins-Minerals (CENTRUM PO) Take 1 tablet by mouth daily.  ? Naproxen Sodium (ALEVE PO) Take 2 tablets by mouth daily as needed (pain).   ? NOVOLOG FLEXPEN 100 UNIT/ML FlexPen INJECT 15 UNITS INTO THE SKIN 3 (THREE) TIMES DAILY WITH MEALS.  ? Omega-3 Fatty Acids (OMEGA 3 PO) Take 1 capsule by mouth daily.  ? Polyethyl Glycol-Propyl Glycol (SYSTANE ULTRA OP) Apply 1 drop to eye daily.  ? Probiotic Product (PROBIOTIC PO) Take 1 capsule by mouth at bedtime.  ? rosuvastatin (CRESTOR) 20 MG tablet TAKE 1 TABLET BY MOUTH EVERYDAY AT BEDTIME  ? Sennosides-Docusate Sodium (SENOKOT S PO) Take 1 tablet by mouth 2 (two) times daily.  ? Turmeric 500 MG TABS Take 1 tablet by mouth daily.  ? ZOLOFT 25 MG tablet Take 1 tablet (25 mg total) by mouth daily.  ? Alcohol Swabs PADS 1 each by Does not apply route 3 (three) times daily. Used for checking  Blood glucose at least 3 times daily  ? [DISCONTINUED] metoprolol succinate (TOPROL-XL) 50 MG 24 hr tablet Take 1 tablet (50 mg) in the AM and 1/2 tablet (25 mg) in the PM  ? ?No facility-administered encounter medications on file as of 01/10/2022.  ? ? ?Allergies (verified) ?Penicillins  ? ?History: ?Past Medical History:  ?Diagnosis Date  ? Arthritis   ? Blood transfusion without reported diagnosis   ? Depression   ? Diabetic neuropathy ([facility])   ? Dizziness   ? Frequent headaches   ? Hyperlipidemia   ? Hypertension   ?  Migraines   ? Palpitations   ? Urinary incontinence   ? ?Past Surgical History:  ?Procedure Laterality Date  ? none    ? ?Family History  ?Problem Relation Age of Onset  ? Diabetes Mother   ? Hypertension Mother   ? Diabetes Maternal Grandmother   ? Diabetes Maternal Grandfather   ? ?Social History  ? ?Socioeconomic History  ? Marital status: Single  ?  Spouse name: Not on file  ? Number of children: 4  ? Years of education: 48  ? Highest education level: Not on file  ?Occupational History  ? Occupation: retired  ?Tobacco Use  ? Smoking status: Never  ? Smokeless tobacco: Never  ?Vaping Use  ? Vaping Use: Never used  ?Substance and Sexual Activity  ? Alcohol use: Yes  ? Drug use: No  ? Sexual activity: Not Currently  ?Other Topics Concern  ? Not on file  ?Social History Narrative  ? Not on file  ? ?Social Determinants of Health  ? ?Financial Resource Strain: Low Risk   ? Difficulty of Paying Living Expenses: Not hard at all  ?Food Insecurity: No Food Insecurity  ? Worried About Programme researcher, broadcasting/film/video in the Last Year: Never true  ? Ran Out of Food in the Last Year: Never true  ?Transportation Needs: No Transportation Needs  ? Lack of Transportation (Medical): No  ? Lack of Transportation (Non-Medical): No  ?Physical Activity: Inactive  ? Days of Exercise per Week: 0 days  ? Minutes of Exercise per Session: 0 min  ?Stress: No Stress Concern Present  ? Feeling of Stress : Only a little  ?Social Connections: Not on file  ? ? ?Tobacco Counseling ?Counseling given: Not Answered ? ? ?Clinical Intake: ? ?Pre-visit preparation completed: Yes ? ?Pain : No/denies pain ? ?  ? ?Nutritional Status: BMI > 30  Obese ?Nutritional Risks: None ?Diabetes: Yes ? ?How often do you need to have someone help you when you read instructions, pamphlets, or other written materials from your doctor or pharmacy?: 1 - Never ? ?Diabetic? Yes ?Nutrition Risk Assessment: ? ?Has the patient had any N/V/D within the last 2 months?  No  ?Does the patient  have any non-healing wounds?  No  ?Has the patient had any unintentional weight loss or weight gain?  No  ? ?Diabetes: ? ?Is the patient diabetic?  Yes  ?If diabetic, was a CBG obtained today?  No  ?Did the patient bring in their glucometer from home?  No  ?How often do you monitor your CBG's? Twice daily.  ? ?Financial Strains and Diabetes Management: ? ?Are you having any financial strains with the device, your supplies or your medication? No .  ?Does the patient want to be seen by Chronic Care Management for management of their diabetes?  No  ?Would the patient like to be referred to a Nutritionist or for Diabetic Management?  No  ? ?Diabetic Exams: ? ?Diabetic Eye Exam: Completed 12/30/2021 ?Diabetic Foot Exam: Overdue, Pt has been advised about the importance in completing this exam. Pt is scheduled for diabetic foot exam on next appointment. ? ? ?Interpreter Needed?: No ? ?Information entered by :: NAllen LPN ? ? ?Activities of Daily Living ? ?  01/10/2022  ?  3:52 PM  ?In your present state of health, do you have any difficulty performing the following activities:  ?Hearing? 0  ?Vision? 0  ?Difficulty concentrating or making decisions? 1  ?Walking or climbing stairs? 1  ?Dressing or bathing? 0  ?Doing errands, shopping? 0  ?Preparing Food and eating ? N  ?Using the Toilet? N  ?In the past six months, have you accidently leaked urine? Y  ?Do you have problems with loss of bowel control? N  ?Managing your Medications? N  ?Managing your Finances? N  ?Housekeeping or managing your Housekeeping? N  ? ? ?Patient Care Team: ?Deeann SaintBanks, Shannon R, MD as PCP - General (Family Medicine) ?Lyn RecordsSmith, Henry W, MD as PCP - Cardiology (Cardiology) ? ?Indicate any recent Medical Services you may have received from other than [facility] providers in the past year (date may be approximate). ? ?   ?Assessment:  ? This [facility] wellness [facility]. ? ?Hearing/Vision screen ?Vision Screening - Comments:: Regular eye  exams, ? ?Dietary issues and exercise activities discussed: ?Current Exercise Habits: The patient does not participate in regular exercise at present ? ? Goals Addressed   ? ?  ?  ?  ?  ? This Visit's Progress  ?  Pati

## 2022-01-10 NOTE — Patient Instructions (Signed)
Karen Powell , ?Thank you for taking time to come for your Medicare Wellness Visit. I appreciate your ongoing commitment to your health goals. Please review the following plan we discussed and let me know if I can assist you in the future.  ? ?Screening recommendations/referrals: ?Colonoscopy: not required ?Mammogram: not required ?Bone Density: due ?Recommended yearly ophthalmology/optometry visit for glaucoma screening and checkup ?Recommended yearly dental visit for hygiene and checkup ? ?Vaccinations: ?Influenza vaccine: due 04/22/2022 ?Pneumococcal vaccine: due ?Tdap vaccine: due ?Shingles vaccine: due   ?Covid-19: 07/12/2021, 03/29/2020, 03/01/2020 ? ?Advanced directives: Advance directive discussed with you today. Even though you declined this today please call our office should you change your mind and we can give you the proper paperwork for you to fill out. ? ?Conditions/risks identified: none ? ?Next appointment: Follow up in one year for your annual wellness visit  ? ? ?Preventive Care 33 Years and Older, Female ?Preventive care refers to lifestyle choices and visits with your health care provider that can promote health and wellness. ?What does preventive care include? ?A yearly physical exam. This is also called an annual well check. ?Dental exams once or twice a year. ?Routine eye exams. Ask your health care provider how often you should have your eyes checked. ?Personal lifestyle choices, including: ?Daily care of your teeth and gums. ?Regular physical activity. ?Eating a healthy diet. ?Avoiding tobacco and drug use. ?Limiting alcohol use. ?Practicing safe sex. ?Taking low-dose aspirin every day. ?Taking vitamin and mineral supplements as recommended by your health care provider. ?What happens during an annual well check? ?The services and screenings done by your health care provider during your annual well check will depend on your age, overall health, lifestyle risk factors, and family history of  disease. ?Counseling  ?Your health care provider may ask you questions about your: ?Alcohol use. ?Tobacco use. ?Drug use. ?Emotional well-being. ?Home and relationship well-being. ?Sexual activity. ?Eating habits. ?History of falls. ?Memory and ability to understand (cognition). ?Work and work Astronomer. ?Reproductive health. ?Screening  ?You may have the following tests or measurements: ?Height, weight, and BMI. ?Blood pressure. ?Lipid and cholesterol levels. These may be checked every 5 years, or more frequently if you are over 29 years old. ?Skin check. ?Lung cancer screening. You may have this screening every year starting at age 78 if you have a 30-pack-year history of smoking and currently smoke or have quit within the past 15 years. ?Fecal occult blood test (FOBT) of the stool. You may have this test every year starting at age 53. ?Flexible sigmoidoscopy or colonoscopy. You may have a sigmoidoscopy every 5 years or a colonoscopy every 10 years starting at age 27. ?Hepatitis C blood test. ?Hepatitis B blood test. ?Sexually transmitted disease (STD) testing. ?Diabetes screening. This is done by checking your blood sugar (glucose) after you have not eaten for a while (fasting). You may have this done every 1-3 years. ?Bone density scan. This is done to screen for osteoporosis. You may have this done starting at age 20. ?Mammogram. This may be done every 1-2 years. Talk to your health care provider about how often you should have regular mammograms. ?Talk with your health care provider about your test results, treatment options, and if necessary, the need for more tests. ?Vaccines  ?Your health care provider may recommend certain vaccines, such as: ?Influenza vaccine. This is recommended every year. ?Tetanus, diphtheria, and acellular pertussis (Tdap, Td) vaccine. You may need a Td booster every 10 years. ?Zoster vaccine. You may need  this after age 62. ?Pneumococcal 13-valent conjugate (PCV13) vaccine. One  dose is recommended after age 41. ?Pneumococcal polysaccharide (PPSV23) vaccine. One dose is recommended after age 68. ?Talk to your health care provider about which screenings and vaccines you need and how often you need them. ?This information is not intended to replace advice given to you by your health care provider. Make sure you discuss any questions you have with your health care provider. ?Document Released: 10/05/2015 Document Revised: 05/28/2016 Document Reviewed: 07/10/2015 ?Elsevier Interactive Patient Education ? 2017 [employer]. ? ?Fall Prevention in the Home ?Falls can cause injuries. They can happen to people of all ages. There are many things you can do to make your home safe and to help prevent falls. ?What can I do on the outside of my home? ?Regularly fix the edges of walkways and driveways and fix any cracks. ?Remove anything that might make you trip as you walk through a door, such as a raised step or threshold. ?Trim any bushes or trees on the path to your home. ?Use bright outdoor lighting. ?Clear any walking paths of anything that might make someone trip, such as rocks or tools. ?Regularly check to see if handrails are loose or broken. Make sure that both sides of any steps have handrails. ?Any raised decks and porches should have guardrails on the edges. ?Have any leaves, snow, or ice cleared regularly. ?Use sand or salt on walking paths during winter. ?Clean up any spills in your garage right away. This includes oil or grease spills. ?What can I do in the bathroom? ?Use night lights. ?Install grab bars by the toilet and in the tub and shower. Do not use towel bars as grab bars. ?Use non-skid mats or decals in the tub or shower. ?If you need to sit down in the shower, use a plastic, non-slip stool. ?Keep the floor dry. Clean up any water that spills on the floor as soon as it happens. ?Remove soap buildup in the tub or shower regularly. ?Attach bath mats securely with double-sided  non-slip rug tape. ?Do not have throw rugs and other things on the floor that can make you trip. ?What can I do in the bedroom? ?Use night lights. ?Make sure that you have a light by your bed that is easy to reach. ?Do not use any sheets or blankets that are too big for your bed. They should not hang down onto the floor. ?Have a firm chair that has side arms. You can use this for support while you get dressed. ?Do not have throw rugs and other things on the floor that can make you trip. ?What can I do in the kitchen? ?Clean up any spills right away. ?Avoid walking on wet floors. ?Keep items that you use a lot in easy-to-reach places. ?If you need to reach something above you, use a strong step stool that has a grab bar. ?Keep electrical cords out of the way. ?Do not use floor polish or wax that makes floors slippery. If you must use wax, use non-skid floor wax. ?Do not have throw rugs and other things on the floor that can make you trip. ?What can I do with my stairs? ?Do not leave any items on the stairs. ?Make sure that there are handrails on both sides of the stairs and use them. Fix handrails that are broken or loose. Make sure that handrails are as long as the stairways. ?Check any carpeting to make sure that it is firmly attached to  the stairs. Fix any carpet that is loose or worn. ?Avoid having throw rugs at the top or bottom of the stairs. If you do have throw rugs, attach them to the floor with carpet tape. ?Make sure that you have a light switch at the top of the stairs and the bottom of the stairs. If you do not have them, ask someone to add them for you. ?What else can I do to help prevent falls? ?Wear shoes that: ?Do not have high heels. ?Have rubber bottoms. ?Are comfortable and fit you well. ?Are closed at the toe. Do not wear sandals. ?If you use a stepladder: ?Make sure that it is fully opened. Do not climb a closed stepladder. ?Make sure that both sides of the stepladder are locked into place. ?Ask  someone to hold it for you, if possible. ?Clearly mark and make sure that you can see: ?Any grab bars or handrails. ?First and last steps. ?Where the edge of each step is. ?Use tools that help you move around (mobil

## 2022-01-11 DIAGNOSIS — Z20822 Contact with and (suspected) exposure to covid-19: Secondary | ICD-10-CM | POA: Diagnosis not present

## 2022-01-16 ENCOUNTER — Other Ambulatory Visit: Payer: Self-pay | Admitting: Interventional Cardiology

## 2022-01-16 NOTE — Telephone Encounter (Signed)
Wyonia Hough, LPN, you were working on pt's metoprolol. This medication was D/C. Was not sure if you meant to D/C this medication. Please address ?

## 2022-01-17 ENCOUNTER — Other Ambulatory Visit: Payer: Self-pay

## 2022-01-17 MED ORDER — METOPROLOL SUCCINATE ER 50 MG PO TB24: ORAL_TABLET | ORAL | 1 refills | Status: DC

## 2022-01-17 MED ORDER — TOPROL XL 25 MG PO TB24: ORAL_TABLET | ORAL | 0 refills | Status: DC

## 2022-01-17 NOTE — Telephone Encounter (Signed)
**Note De-Identified  Obfuscation** No determination received so I called [facility] and s/w [place]rene [place] who advised me that the pts plan does cover name brand Toprol XL and they also cover the quantity of #270 for a 90 day supply (they do not require that she take 1 tablet in the AM and 1/2 in the PM. ? ?Larene Beach states that she does not know why we received the "Toprol XL Brand Penalty Exception form"  but that it is not required and per my request she re-checked the pts plan and verified again that a PA, Brand Penalty Exception, or quantity exception IS NOT REQUIRED BY THE PTS INS PLAN. ? ?I have e-scribed the pts Toprol XL 25 mg #270 with no refills [id number] [place] [place] (Ph: [phone]) with a note to the pharmacist that a PA is not required per [facility]. ?

## 2022-01-20 DIAGNOSIS — Z20822 Contact with and (suspected) exposure to covid-19: Secondary | ICD-10-CM | POA: Diagnosis not present

## 2022-01-25 DIAGNOSIS — Z20822 Contact with and (suspected) exposure to covid-19: Secondary | ICD-10-CM | POA: Diagnosis not present

## 2022-01-28 NOTE — Progress Notes (Signed)
?[facility] Note ? ?02/04/2022 ? ?  ? ?CHIEF COMPLAINT ?Patient presents for Retina Follow Up ? ? ?HISTORY OF PRESENT ILLNESS: ?Karen Powell is a 86 y.o. female who presents to the clinic today for:  ? ?HPI   ? ? Retina Follow Up   ?Patient presents with  Diabetic Retinopathy (IVA OS #3 (04.18.23)).  In both eyes.  This started months ago.  Severity is moderate.  Duration of 4 weeks.  Since onset it is stable.  I, the attending physician,  performed the HPI with the patient and updated documentation appropriately. ? ?  ?  ? ? Comments   ?Patient feels that the vision has remained the same since her last visit 4 weeks ago. Her blood sugar is 187 checked in office and her A1C is 6.8. ? ?  ?  ?Last edited by Karen Caffey, MD on 02/04/2022  3:14 PM.  ?  ?Pt feels like vision has improved ? ?Referring physician: ?Karen Ruddy, MD ?[address] ?[place],  [place] [address] ? ?HISTORICAL INFORMATION:  ? ?Selected notes from the [facility] ?Referred by Dr. Lucianne Lei for mac edema ?LEE: 07/04/21 BCVA OD: 20/40 OS: 20/100 ?Ocular Hx- amblyopia, macular edema OS ?PMH- DM, HTN, migraines, high cholesterol; last a1c was 8.2 on 07/11/21 ?  ? ?CURRENT MEDICATIONS: ?Current Outpatient Medications (Ophthalmic Drugs)  ?Medication Sig  ? Polyethyl Glycol-Propyl Glycol (SYSTANE ULTRA OP) Apply 1 drop to eye daily.  ? ?No current facility-administered medications for this visit. (Ophthalmic Drugs)  ? ?Current Outpatient Medications (Other)  ?Medication Sig  ? Alcohol Swabs PADS 1 each by Does not apply route 3 (three) times daily. Used for checking Blood glucose at least 3 times daily  ? Apoaequorin (PREVAGEN PO) Take 1 capsule by mouth daily.  ? aspirin EC 81 MG tablet Take 1 tablet (81 mg total) by mouth daily.  ? BD PEN NEEDLE MICRO U/F 32G X 6 MM MISC USE TO ADMINISTER INSULIN THREE TIMES DAILY  ? Cetirizine HCl (ZYRTEC ALLERGY PO) Take 1 tablet by mouth daily.  ? Coenzyme Q10 (COQ10) 200  MG CAPS Take 1 capsule by mouth daily.  ? Continuous Blood Gluc Sensor (FREESTYLE LIBRE 2 SENSOR) MISC Use to check blood sugars.  ? esomeprazole (NEXIUM) 20 MG capsule Take 20 mg by mouth daily at 12 noon.  ? insulin glargine (LANTUS SOLOSTAR) 100 UNIT/ML Solostar Pen Inject 57 Units into the skin at bedtime.  ? losartan-hydrochlorothiazide (HYZAAR) 100-25 MG tablet Take 1 tablet by mouth daily.  ? metFORMIN (GLUCOPHAGE) 500 MG tablet Take 1 tablet (500 mg total) by mouth 2 (two) times daily.  ? Multiple Vitamins-Minerals (CENTRUM PO) Take 1 tablet by mouth daily.  ? Naproxen Sodium (ALEVE PO) Take 2 tablets by mouth daily as needed (pain).   ? NOVOLOG FLEXPEN 100 UNIT/ML FlexPen INJECT 15 UNITS INTO THE SKIN 3 (THREE) TIMES DAILY WITH MEALS.  ? Omega-3 Fatty Acids (OMEGA 3 PO) Take 1 capsule by mouth daily.  ? Probiotic Product (PROBIOTIC PO) Take 1 capsule by mouth at bedtime.  ? rosuvastatin (CRESTOR) 20 MG tablet TAKE 1 TABLET BY MOUTH EVERYDAY AT BEDTIME  ? Sennosides-Docusate Sodium (SENOKOT S PO) Take 1 tablet by mouth 2 (two) times daily.  ? TOPROL XL 25 MG 24 hr tablet Take 2 tablets (50 mg) in the morning and 1 tablet (25 mg) in the evening. Per [facility] this does not require a PA as it is covered  by the patients plan. Thank you.  ? Turmeric 500 MG TABS Take 1 tablet by mouth daily.  ? ZOLOFT 25 MG tablet Take 1 tablet (25 mg total) by mouth daily.  ? ?No current facility-administered medications for this visit. (Other)  ? ?REVIEW OF SYSTEMS: ?ROS   ?Positive for: Endocrine, Cardiovascular, Eyes ?Negative for: Constitutional, Gastrointestinal, Neurological, Skin, Genitourinary, Musculoskeletal, HENT, Respiratory, Psychiatric, Allergic/Imm, Heme/Lymph ?Last edited by Karen Powell, COT on 02/04/2022  1:48 PM.  ?  ? ?ALLERGIES ?Allergies  ?Allergen Reactions  ? Penicillins Anaphylaxis  ? ?PAST MEDICAL HISTORY ?Past Medical History:  ?Diagnosis Date  ? Arthritis   ? Blood transfusion without  reported diagnosis   ? Depression   ? Diabetic neuropathy ([facility])   ? Dizziness   ? Frequent headaches   ? Hyperlipidemia   ? Hypertension   ? Migraines   ? Palpitations   ? Urinary incontinence   ? ?Past Surgical History:  ?Procedure Laterality Date  ? none    ? ?FAMILY HISTORY ?Family History  ?Problem Relation Age of Onset  ? Diabetes Mother   ? Hypertension Mother   ? Diabetes Maternal Grandmother   ? Diabetes Maternal Grandfather   ? ?SOCIAL HISTORY ?Social History  ? ?Tobacco Use  ? Smoking status: Never  ? Smokeless tobacco: Never  ?Vaping Use  ? Vaping Use: Never used  ?Substance Use Topics  ? Alcohol use: Yes  ? Drug use: No  ?  ? ?  ?OPHTHALMIC EXAM: ? ?Base Eye Exam   ? ? Visual Acuity (Snellen - Linear)   ? ?   Right Left  ? Dist [place] 20/40 +2 20/100 -2  ? Dist ph   20/80  ?Forgot glasses ? ?  ?  ? ? Tonometry (Tonopen, 1:55 PM)   ? ?   Right Left  ? Pressure 12 14  ? ?  ?  ? ? Pupils   ? ?   Dark Light Shape React APD  ? Right 4 3 Round Brisk None  ? Left 4 3 Round Brisk None  ? ?  ?  ? ? Visual Fields   ? ?   Left Right  ?  Full Full  ? ?  ?  ? ? Extraocular Movement   ? ?   Right Left  ?  Full, Ortho Full, Ortho  ? ?  ?  ? ? Neuro/Psych   ? ? Oriented x3: Yes  ? Mood/Affect: Normal  ? ?  ?  ? ? Dilation   ? ? Both eyes: 1.0% Mydriacyl, 2.5% Phenylephrine @ 1:49 PM  ? ?  ?  ? ?  ? ?Slit Lamp and Fundus Exam   ? ? Slit Lamp Exam   ? ?   Right Left  ? Lids/Lashes Dermatochalasis - upper lid Dermatochalasis - upper lid  ? Conjunctiva/Sclera nasal and temporal pinguecula, mild melanosis nasal and temporal pinguecula, mild melanosis  ? Cornea 2+ Punctate epithelial erosions, mild arcus 2+ Punctate epithelial erosions, mild arcus  ? Anterior Chamber Deep and quiet Deep and quiet  ? Iris Round and dilated Round and dilated  ? Lens 3 piece PC IOL, open PC 3 piece PC IOL, open PC  ? Anterior Vitreous Vitreous syneresis, Posterior vitreous detachment, mild Asteroid hyalosis inferiorly Vitreous syneresis, Posterior  vitreous detachment  ? ?  ?  ? ? Fundus Exam   ? ?   Right Left  ? Disc Pink and Sharp, mild PPA/PPP Pink and Sharp, mild  temporal PPA  ? C/D Ratio 0.3 0.5  ? Macula Flat, Good foveal reflex, rare MA, cluster of drusen ST mac Blunted foveal reflex, central cystic changes - slightly improved, +MA, pigmented CR scarring, mild ERM temporal macula  ? Vessels attenuated, Tortuous attenuated, mild tortuosity, mild Copper wiring  ? Periphery Attached, mild midzonal drusen, rare MA Attached, mild midzonal drusen, mild reticular degeneration, rare MA  ? ?  ?  ? ?  ? ?Refraction   ? ? Wearing Rx   ? ?   Sphere Cylinder Axis Add  ? Right -0.50 Sphere  +2.50  ? Left -0.50 +0.50 090 +2.50  ? ?  ?  ? ?  ? ?IMAGING AND PROCEDURES  ?Imaging and Procedures for 02/04/2022 ? ?OCT, Retina - OU - Both Eyes   ? ?   ?Right Eye ?Quality was good. Central Foveal Thickness: 249. Progression has been stable. Findings include normal foveal contour, no IRF, no SRF (Rare drusen).  ? ?Left Eye ?Quality was good. Central Foveal Thickness: 324. Progression has improved. Findings include abnormal foveal contour, intraretinal fluid, intraretinal hyper-reflective material, no SRF (Mild interval improvement in IRF/IRHM temporal fovea).  ? ?Notes ?*Images captured and stored on drive ? ?Diagnosis / Impression:  ?OD: NFP; no IRF/SRF ?OS: Mild interval improvement in IRF/IRHM temporal fovea ? ? ?Clinical management:  ?See below ? ?Abbreviations: NFP - Normal foveal profile. CME - cystoid macular edema. PED - pigment epithelial detachment. IRF - intraretinal fluid. SRF - subretinal fluid. EZ - ellipsoid zone. ERM - epiretinal membrane. ORA - outer retinal atrophy. ORT - outer retinal tubulation. SRHM - subretinal hyper-reflective material. IRHM - intraretinal hyper-reflective material ? ? ?  ? ?Intravitreal Injection, Pharmacologic Agent - OS - Left Eye   ? ?   ?Time Out ?02/04/2022. 2:31 PM. Confirmed correct patient, procedure, site, and patient consented.   ? ?Anesthesia ?Topical anesthesia was used. Anesthetic medications included Lidocaine 2%, Proparacaine 0.5%.  ? ?Procedure ?Preparation included 5% betadine to ocular surface, eyelid speculum. A (32g) ne

## 2022-01-30 ENCOUNTER — Encounter: Payer: Self-pay | Admitting: Family Medicine

## 2022-01-30 ENCOUNTER — Ambulatory Visit (INDEPENDENT_AMBULATORY_CARE_PROVIDER_SITE_OTHER): Payer: Medicare Other | Admitting: Family Medicine

## 2022-01-30 ENCOUNTER — Ambulatory Visit: Payer: Medicare Other | Admitting: Family Medicine

## 2022-01-30 VITALS — BP 156/92 | HR 75 | Temp 99.1°F | Wt 170.0 lb

## 2022-01-30 DIAGNOSIS — Z794 Long term (current) use of insulin: Secondary | ICD-10-CM | POA: Diagnosis not present

## 2022-01-30 DIAGNOSIS — I10 Essential (primary) hypertension: Secondary | ICD-10-CM | POA: Diagnosis not present

## 2022-01-30 DIAGNOSIS — F0390 Unspecified dementia without behavioral disturbance: Secondary | ICD-10-CM | POA: Diagnosis not present

## 2022-01-30 DIAGNOSIS — E1140 Type 2 diabetes mellitus with diabetic neuropathy, unspecified: Secondary | ICD-10-CM

## 2022-01-30 DIAGNOSIS — Z029 Encounter for administrative examinations, unspecified: Secondary | ICD-10-CM

## 2022-01-30 NOTE — Progress Notes (Signed)
Subjective:  ? ? Patient ID: Karen Powell, female    DOB: 1935-12-17, 86 y.o.   MRN: [id number] ? ?Chief Complaint  ?Patient presents with  ? Follow-up  ?  Wants to know why she is paying more for meds and other concerns. Note to live with her granddaughter  ?Pt's daughter, Eunice Blase, joined half way through visit. ? ?HPI ?Patient was seen today for f/u.  Pt checking blood sugar 3 times per day.  Had several hypoglycemic readings at 45 and 47 in a.m.  Patient states she does not feel bad until her blood sugar is 44.  Patient states she keeps mints, cough drops and other items with her at all times in case her blood sugar drops.  Having several high readings to 225, 177, 279, 193 in the evenings.  Taking NovoLog 15 units 3 times daily with meals, Lantus 57 units nightly, and metformin 500 mg twice daily.  Pt mentions occasionally having a beer at night.   ? ?Patient requesting a note for her rental company as she would like to break her lease early and move in with her granddaughter who has a 5 bdrm home.  Pt's current lease ends in October 2023. ? ?Past Medical History:  ?Diagnosis Date  ? Arthritis   ? Blood transfusion without reported diagnosis   ? Depression   ? Diabetic neuropathy (HCC)   ? Dizziness   ? Frequent headaches   ? Hyperlipidemia   ? Hypertension   ? Migraines   ? Palpitations   ? Urinary incontinence   ? ? ?Allergies  ?Allergen Reactions  ? Penicillins Anaphylaxis  ? ? ?ROS  + limited 2/2 dementia ?General: Denies fever, chills, night sweats, changes in weight, changes in appetite ?HEENT: Denies headaches, ear pain, changes in vision, rhinorrhea, sore throat ?CV: Denies CP, palpitations, SOB, orthopnea ?Pulm: Denies SOB, cough, wheezing ?GI: Denies abdominal pain, nausea, vomiting, diarrhea, constipation ?GU: Denies dysuria, hematuria, frequency, vaginal discharge ?Msk: Denies muscle cramps, joint pains ?Neuro: Denies weakness, numbness, tingling ?Skin: Denies rashes, bruising ?Psych: Denies  depression, anxiety, hallucinations ?   ?Objective:  ?  ?Blood pressure (!) 156/92, pulse 75, temperature 99.1 ?F (37.3 ?C), temperature source Oral, weight 170 lb (77.1 kg), SpO2 97 %. ? ?Gen. Pleasant, well-nourished, in no distress, normal affect   ?HEENT: Lindsay/AT, face symmetric, conjunctiva clear, no scleral icterus, PERRLA, EOMI, nares patent without drainage ?Lungs: no accessory muscle use, CTAB, no wheezes or rales ?Cardiovascular: RRR, no m/r/g, no peripheral edema ?Musculoskeletal: No deformities, no cyanosis or clubbing, normal tone ?Neuro:  A&Ox3, CN II-XII intact, normal gait ?Skin:  Warm, no lesions/ rash ? ? ?Wt Readings from Last 3 Encounters:  ?01/30/22 170 lb (77.1 kg)  ?01/10/22 170 lb (77.1 kg)  ?12/06/21 170 lb (77.1 kg)  ? ? ?Lab Results  ?Component Value Date  ? WBC 5.0 02/20/2021  ? HGB 12.9 02/20/2021  ? HCT 39.1 02/20/2021  ? PLT 202.0 02/20/2021  ? GLUCOSE 433 (H) 02/20/2021  ? CHOL 154 02/20/2021  ? TRIG 126.0 02/20/2021  ? HDL 47.00 02/20/2021  ? LDLCALC 82 02/20/2021  ? ALT 16 02/20/2021  ? AST 20 02/20/2021  ? NA 141 02/20/2021  ? K 4.3 02/20/2021  ? CL 103 02/20/2021  ? CREATININE 0.99 02/20/2021  ? BUN 26 (H) 02/20/2021  ? CO2 19 02/20/2021  ? TSH 1.03 02/20/2021  ? HGBA1C 6.8 (A) 12/06/2021  ? ? ?Assessment/Plan: ? ?Dementia without behavioral disturbance (HCC) ?-Stable ?-Discussed neuropsych testing and medication.  Patient declines at this time. ?-We will continue to monitor ?-Family involvement strongly encouraged however this often causes a point of contention with patient. ? ?Type 2 diabetes mellitus with diabetic neuropathy, with long-term current use of insulin (HCC) ?-hgb A1c 6.8% on 12/06/2021 ?-As previously discussed.  Hesitant to make changes to medications as would like to avoid confusion per patient.  Advised moving in with her granddaughter, would be helpful as she could assist with medication management. ?-Lifestyle modifications ?-Continue NovoLog 15 units 3 times  daily with meals, Lantus 57 units nightly, and metformin 500 mg twice daily ?-Foot exam at next [facility] ?-Continue ARB and statin ?-Patient to schedule diabetic retinopathy screening ? ?Essential hypertension ?-Elevated ?-Unclear if patient took medication this morning ?-Continue current medications including Toprol XL 50 mg in a.m. and 25 mg in p.m., losartan-hydrochlorothiazide 100-25 mg daily ?-Continue lifestyle modifications ?-Continue follow-up with cardiology ? ?F/u 2-3 months ? ?Abbe Amsterdam, MD ?

## 2022-02-04 ENCOUNTER — Ambulatory Visit (INDEPENDENT_AMBULATORY_CARE_PROVIDER_SITE_OTHER): Payer: Medicare Other | Admitting: Ophthalmology

## 2022-02-04 ENCOUNTER — Encounter (INDEPENDENT_AMBULATORY_CARE_PROVIDER_SITE_OTHER): Payer: Self-pay | Admitting: Ophthalmology

## 2022-02-04 DIAGNOSIS — I10 Essential (primary) hypertension: Secondary | ICD-10-CM

## 2022-02-04 DIAGNOSIS — E113213 Type 2 diabetes mellitus with mild nonproliferative diabetic retinopathy with macular edema, bilateral: Secondary | ICD-10-CM

## 2022-02-04 DIAGNOSIS — Z961 Presence of intraocular lens: Secondary | ICD-10-CM | POA: Diagnosis not present

## 2022-02-04 DIAGNOSIS — H35033 Hypertensive retinopathy, bilateral: Secondary | ICD-10-CM

## 2022-02-04 DIAGNOSIS — H353132 Nonexudative age-related macular degeneration, bilateral, intermediate dry stage: Secondary | ICD-10-CM

## 2022-02-04 MED ORDER — BEVACIZUMAB CHEMO INJECTION 1.25MG/0.05ML SYRINGE FOR KALEIDOSCOPE
1.2500 mg | INTRAVITREAL | Status: AC | PRN
  Administered 2022-02-04: 1.25 mg via INTRAVITREAL

## 2022-02-18 DIAGNOSIS — H04123 Dry eye syndrome of bilateral lacrimal glands: Secondary | ICD-10-CM | POA: Diagnosis not present

## 2022-02-20 NOTE — Progress Notes (Signed)
[facility] Note  03/04/2022     CHIEF COMPLAINT Patient presents for Retina Follow Up   HISTORY OF PRESENT ILLNESS: Karen Powell is a 86 y.o. female who presents to the clinic today for:   HPI     Retina Follow Up   Patient presents with  Diabetic Retinopathy.  In both eyes.  Severity is moderate.  Duration of 4 weeks.  I, the attending physician,  performed the HPI with the patient and updated documentation appropriately.        Comments   Patient states vision the same OU. BS was 212 this am. Last a1c was 6.8, checked 12/06/21.       Last edited by Bernarda Caffey, MD on 03/04/2022 11:01 PM.     Referring physician: Billie Ruddy, MD [place],  [place] [address]  HISTORICAL INFORMATION:   Selected notes from the MEDICAL RECORD NUMBER Referred by Dr. Lucianne Lei for mac edema LEE: 07/04/21 BCVA OD: 20/40 OS: 20/100 Ocular Hx- amblyopia, macular edema OS PMH- DM, HTN, migraines, high cholesterol; last a1c was 8.2 on 07/11/21    CURRENT MEDICATIONS: Current Outpatient Medications (Ophthalmic Drugs)  Medication Sig   Polyethyl Glycol-Propyl Glycol (SYSTANE ULTRA OP) Apply 1 drop to eye daily.   No current facility-administered medications for this visit. (Ophthalmic Drugs)   Current Outpatient Medications (Other)  Medication Sig   Alcohol Swabs PADS 1 each by Does not apply route 3 (three) times daily. Used for checking Blood glucose at least 3 times daily   Apoaequorin (PREVAGEN PO) Take 1 capsule by mouth daily.   aspirin EC 81 MG tablet Take 1 tablet (81 mg total) by mouth daily.   BD PEN NEEDLE MICRO U/F 32G X 6 MM MISC USE TO ADMINISTER INSULIN THREE TIMES DAILY   Cetirizine HCl (ZYRTEC ALLERGY PO) Take 1 tablet by mouth daily.   Coenzyme Q10 (COQ10) 200 MG CAPS Take 1 capsule by mouth daily.   Continuous Blood Gluc Sensor (FREESTYLE LIBRE 2 SENSOR) MISC Use to check blood sugars.   esomeprazole (NEXIUM) 20 MG  capsule Take 20 mg by mouth daily at 12 noon.   insulin glargine (LANTUS SOLOSTAR) 100 UNIT/ML Solostar Pen Inject 57 Units into the skin at bedtime.   losartan-hydrochlorothiazide (HYZAAR) 100-25 MG tablet Take 1 tablet by mouth daily.   metFORMIN (GLUCOPHAGE) 500 MG tablet Take 1 tablet (500 mg total) by mouth 2 (two) times daily.   Multiple Vitamins-Minerals (CENTRUM PO) Take 1 tablet by mouth daily.   Naproxen Sodium (ALEVE PO) Take 2 tablets by mouth daily as needed (pain).    NOVOLOG FLEXPEN 100 UNIT/ML FlexPen INJECT 15 UNITS INTO THE SKIN 3 (THREE) TIMES DAILY WITH MEALS.   Omega-3 Fatty Acids (OMEGA 3 PO) Take 1 capsule by mouth daily.   Probiotic Product (PROBIOTIC PO) Take 1 capsule by mouth at bedtime.   rosuvastatin (CRESTOR) 20 MG tablet TAKE 1 TABLET BY MOUTH EVERYDAY AT BEDTIME   Sennosides-Docusate Sodium (SENOKOT S PO) Take 1 tablet by mouth 2 (two) times daily.   TOPROL XL 25 MG 24 hr tablet Take 2 tablets (50 mg) in the morning and 1 tablet (25 mg) in the evening. Per [facility] this does not require a PA as it is covered by the patients plan. Thank you.   Turmeric 500 MG TABS Take 1 tablet by mouth daily.   ZOLOFT 25 MG tablet Take 1 tablet (25 mg total) by mouth daily.  No current facility-administered medications for this visit. (Other)   REVIEW OF SYSTEMS: ROS   Positive for: Endocrine, Cardiovascular, Eyes Negative for: Constitutional, Gastrointestinal, Neurological, Skin, Genitourinary, Musculoskeletal, HENT, Respiratory, Psychiatric, Allergic/Imm, Heme/Lymph Last edited by Roselee Nova D, COT on 03/04/2022  2:00 PM.      ALLERGIES Allergies  Allergen Reactions   Penicillins Anaphylaxis   PAST MEDICAL HISTORY Past Medical History:  Diagnosis Date   Arthritis    Blood transfusion without reported diagnosis    Depression    Diabetic neuropathy ([place])    Dizziness    Frequent headaches    Hyperlipidemia    Hypertension    Migraines    Palpitations     Urinary incontinence    Past Surgical History:  Procedure Laterality Date   none     FAMILY HISTORY Family History  Problem Relation Age of Onset   Diabetes Mother    Hypertension Mother    Diabetes Maternal Grandmother    Diabetes Maternal Grandfather    SOCIAL HISTORY Social History   Tobacco Use   Smoking status: Never   Smokeless tobacco: Never  Vaping Use   Vaping Use: Never used  Substance Use Topics   Alcohol use: Yes   Drug use: No       OPHTHALMIC EXAM:  Base Eye Exam     Visual Acuity (Snellen - Linear)       Right Left   Dist [place] 20/40 20/80 -1   Dist ph [place] 20/25 -1 20/70 -2         Tonometry (Tonopen, 2:12 PM)       Right Left   Pressure 14 15         Pupils       Dark Light Shape React APD   Right 4 3 Round Brisk None   Left 4 3 Round Brisk None         Visual Fields (Counting fingers)       Left Right    Full Full         Extraocular Movement       Right Left    Full, Ortho Full, Ortho         Neuro/Psych     Oriented x3: Yes   Mood/Affect: Normal         Dilation     Both eyes: 1.0% Mydriacyl, 2.5% Phenylephrine @ 2:12 PM           Slit Lamp and Fundus Exam     Slit Lamp Exam       Right Left   Lids/Lashes Dermatochalasis - upper lid Dermatochalasis - upper lid   Conjunctiva/Sclera nasal and temporal pinguecula, mild melanosis nasal and temporal pinguecula, mild melanosis   Cornea 2+ Punctate epithelial erosions, mild arcus 2+ Punctate epithelial erosions, mild arcus   Anterior Chamber Deep and quiet Deep and quiet   Iris Round and dilated Round and dilated   Lens 3 piece PC IOL, open PC 3 piece PC IOL, open PC   Anterior Vitreous Vitreous syneresis, Posterior vitreous detachment, mild Asteroid hyalosis inferiorly Vitreous syneresis, Posterior vitreous detachment         Fundus Exam       Right Left   Disc Pink and Sharp, mild PPA/PPP Pink and Sharp, mild temporal PPA   C/D Ratio 0.3 0.5    Macula Flat, Good foveal reflex, rare MA, cluster of drusen ST mac Blunted foveal reflex, central cystic changes - slightly improved, +MA, pigmented  CR scarring, mild ERM temporal macula   Vessels attenuated, Tortuous attenuated, mild tortuosity, mild Copper wiring   Periphery Attached, mild midzonal drusen, rare MA Attached, mild midzonal drusen, mild reticular degeneration, rare MA           IMAGING AND PROCEDURES  Imaging and Procedures for 03/04/2022  OCT, Retina - OU - Both Eyes       Right Eye Quality was good. Central Foveal Thickness: 254. Progression has been stable. Findings include normal foveal contour, no IRF, no SRF (Rare drusen).   Left Eye Quality was good. Central Foveal Thickness: 320. Progression has improved. Findings include no SRF, abnormal foveal contour, intraretinal hyper-reflective material, intraretinal fluid (Mild interval improvement in IRF/IRHM temporal fovea).   Notes *Images captured and stored on drive  Diagnosis / Impression:  OD: NFP; no IRF/SRF OS: Mild interval improvement in IRF/IRHM temporal fovea   Clinical management:  See below  Abbreviations: NFP - Normal foveal profile. CME - cystoid macular edema. PED - pigment epithelial detachment. IRF - intraretinal fluid. SRF - subretinal fluid. EZ - ellipsoid zone. ERM - epiretinal membrane. ORA - outer retinal atrophy. ORT - outer retinal tubulation. SRHM - subretinal hyper-reflective material. IRHM - intraretinal hyper-reflective material      Intravitreal Injection, Pharmacologic Agent - OS - Left Eye       Time Out 03/04/2022. 2:33 PM. Confirmed correct patient, procedure, site, and patient consented.   Anesthesia Topical anesthesia was used. Anesthetic medications included Lidocaine 2%, Proparacaine 0.5%.   Procedure Preparation included 5% betadine to ocular surface, eyelid speculum. A (32g) needle was used.   Injection: 1.25 mg Bevacizumab 1.4m/0.05ml   Route: Intravitreal,  Site: Left Eye   NDC:: 16109-604-54 Lot:: [id number] Expiration date: 04/19/2022   Post-op Post injection exam found visual acuity of at least counting fingers. The patient tolerated the procedure well. There were no complications. The patient received written and verbal post procedure care education. Post injection medications were not given.            ASSESSMENT/PLAN:    ICD-10-CM   1. Both eyes affected by mild nonproliferative diabetic retinopathy with macular edema, associated with type 2 diabetes mellitus (HCC)  EY78.2956OCT, Retina - OU - Both Eyes    Intravitreal Injection, Pharmacologic Agent - OS - Left Eye    Bevacizumab (AVASTIN) SOLN 1.25 mg    2. Intermediate stage nonexudative age-related macular degeneration of both eyes  H35.3132     3. Essential hypertension  I10     4. Hypertensive retinopathy of both eyes  H35.033     5. Pseudophakia, both eyes  Z96.1       Mild Non-proliferative diabetic retinopathy, both eyes - last A1c 6.8 on 3.17.23 from 8.2 on 10.20.22  - IVA OS #1 (02.21.23), #2 (03.21.23), #3 (04.18.22), #4 (05.16.23) - OD: without DME - OS: +DME - exam shows rare MA OU - unable to obtain FA -- poor IV access - OCT shows mild interval improvement in IRF/edema OS  - recommend IVA OS #5 today, 06.13.23 for DME - pt wishes to proceed with injection - RBA of procedure discussed, questions answered - informed consent obtained and signed for IVA OS on 02.21.23 - see procedure note - f/u in 4 wks -- DFE/OCT, possible injection  2. Age related macular degeneration, non-exudative, OU  - intermediate stage with mild midzonal drusen - The incidence, anatomy, and pathology of dry AMD, risk of progression, and the AREDS and AREDS 2 studies including  smoking risks discussed with patient. - Cont [place]ler Grid monitoring  3,4. Hypertensive retinopathy OU - discussed importance of tight BP control - monitor  5. Pseudophakia OU  - s/p CE/IOL OU  - 3-piece  IOLs in good position, doing well  - monitor   Ophthalmic Meds Ordered this visit:  Meds ordered this encounter  Medications   Bevacizumab (AVASTIN) SOLN 1.25 mg     Return in about 4 weeks (around 04/01/2022) for DFE, OCT, possible injection.  There are no Patient Instructions on file for this visit.   Explained the diagnoses, plan, and follow up with the patient and they expressed understanding.  Patient expressed understanding of the importance of proper follow up care.   This document serves as a record of services personally performed by Gardiner Sleeper, MD, PhD. It was created on their behalf by Renaldo Reel, [place] an ophthalmic technician. The creation of this record is the provider's dictation and/or activities during the visit.    Electronically signed by:  Renaldo Reel, COT  02/20/22 11:05 PM  Gardiner Sleeper, M.D., Ph.D. Diseases & Surgery of the Retina and Vitreous [facility]  I have reviewed the above documentation for accuracy and completeness, and I agree with the above. Gardiner Sleeper, M.D., Ph.D. 03/04/22 11:07 PM  Abbreviations: M myopia (nearsighted); A astigmatism; H hyperopia (farsighted); P presbyopia; Mrx spectacle prescription;  CTL contact lenses; OD right eye; OS left eye; OU both eyes  XT exotropia; ET esotropia; PEK punctate epithelial keratitis; PEE punctate epithelial erosions; DES dry eye syndrome; MGD meibomian gland dysfunction; ATs artificial tears; PFAT's preservative free artificial tears; [place] nuclear sclerotic cataract; PSC posterior subcapsular cataract; ERM epi-retinal membrane; PVD posterior vitreous detachment; RD retinal detachment; DM diabetes mellitus; DR diabetic retinopathy; NPDR non-proliferative diabetic retinopathy; PDR proliferative diabetic retinopathy; CSME clinically significant macular edema; DME diabetic macular edema; dbh dot blot hemorrhages; CWS cotton wool spot; POAG primary open angle glaucoma;  C/D cup-to-disc ratio; HVF humphrey visual field; GVF goldmann visual field; OCT optical coherence tomography; IOP intraocular pressure; BRVO Branch retinal vein occlusion; CRVO central retinal vein occlusion; CRAO central retinal artery occlusion; BRAO branch retinal artery occlusion; RT retinal tear; SB scleral buckle; PPV pars plana vitrectomy; VH Vitreous hemorrhage; PRP panretinal laser photocoagulation; IVK intravitreal kenalog; VMT vitreomacular traction; MH Macular hole;  NVD neovascularization of the disc; NVE neovascularization elsewhere; AREDS age related eye disease study; ARMD age related macular degeneration; POAG primary open angle glaucoma; EBMD epithelial/anterior basement membrane dystrophy; ACIOL anterior chamber intraocular lens; IOL intraocular lens; PCIOL posterior chamber intraocular lens; Phaco/IOL phacoemulsification with intraocular lens placement; [place] photorefractive keratectomy; LASIK laser assisted in situ keratomileusis; HTN hypertension; DM diabetes mellitus; COPD chronic obstructive pulmonary disease

## 2022-02-23 IMAGING — DX DG CHEST 2V
2 series · 2 of 2 positions shown · non-contrast
Comparison: None

CLINICAL DATA: LEFT chest pain, fell from standing position onto
sidewalk

EXAM:
CHEST - 2 VIEW

[chest pa]
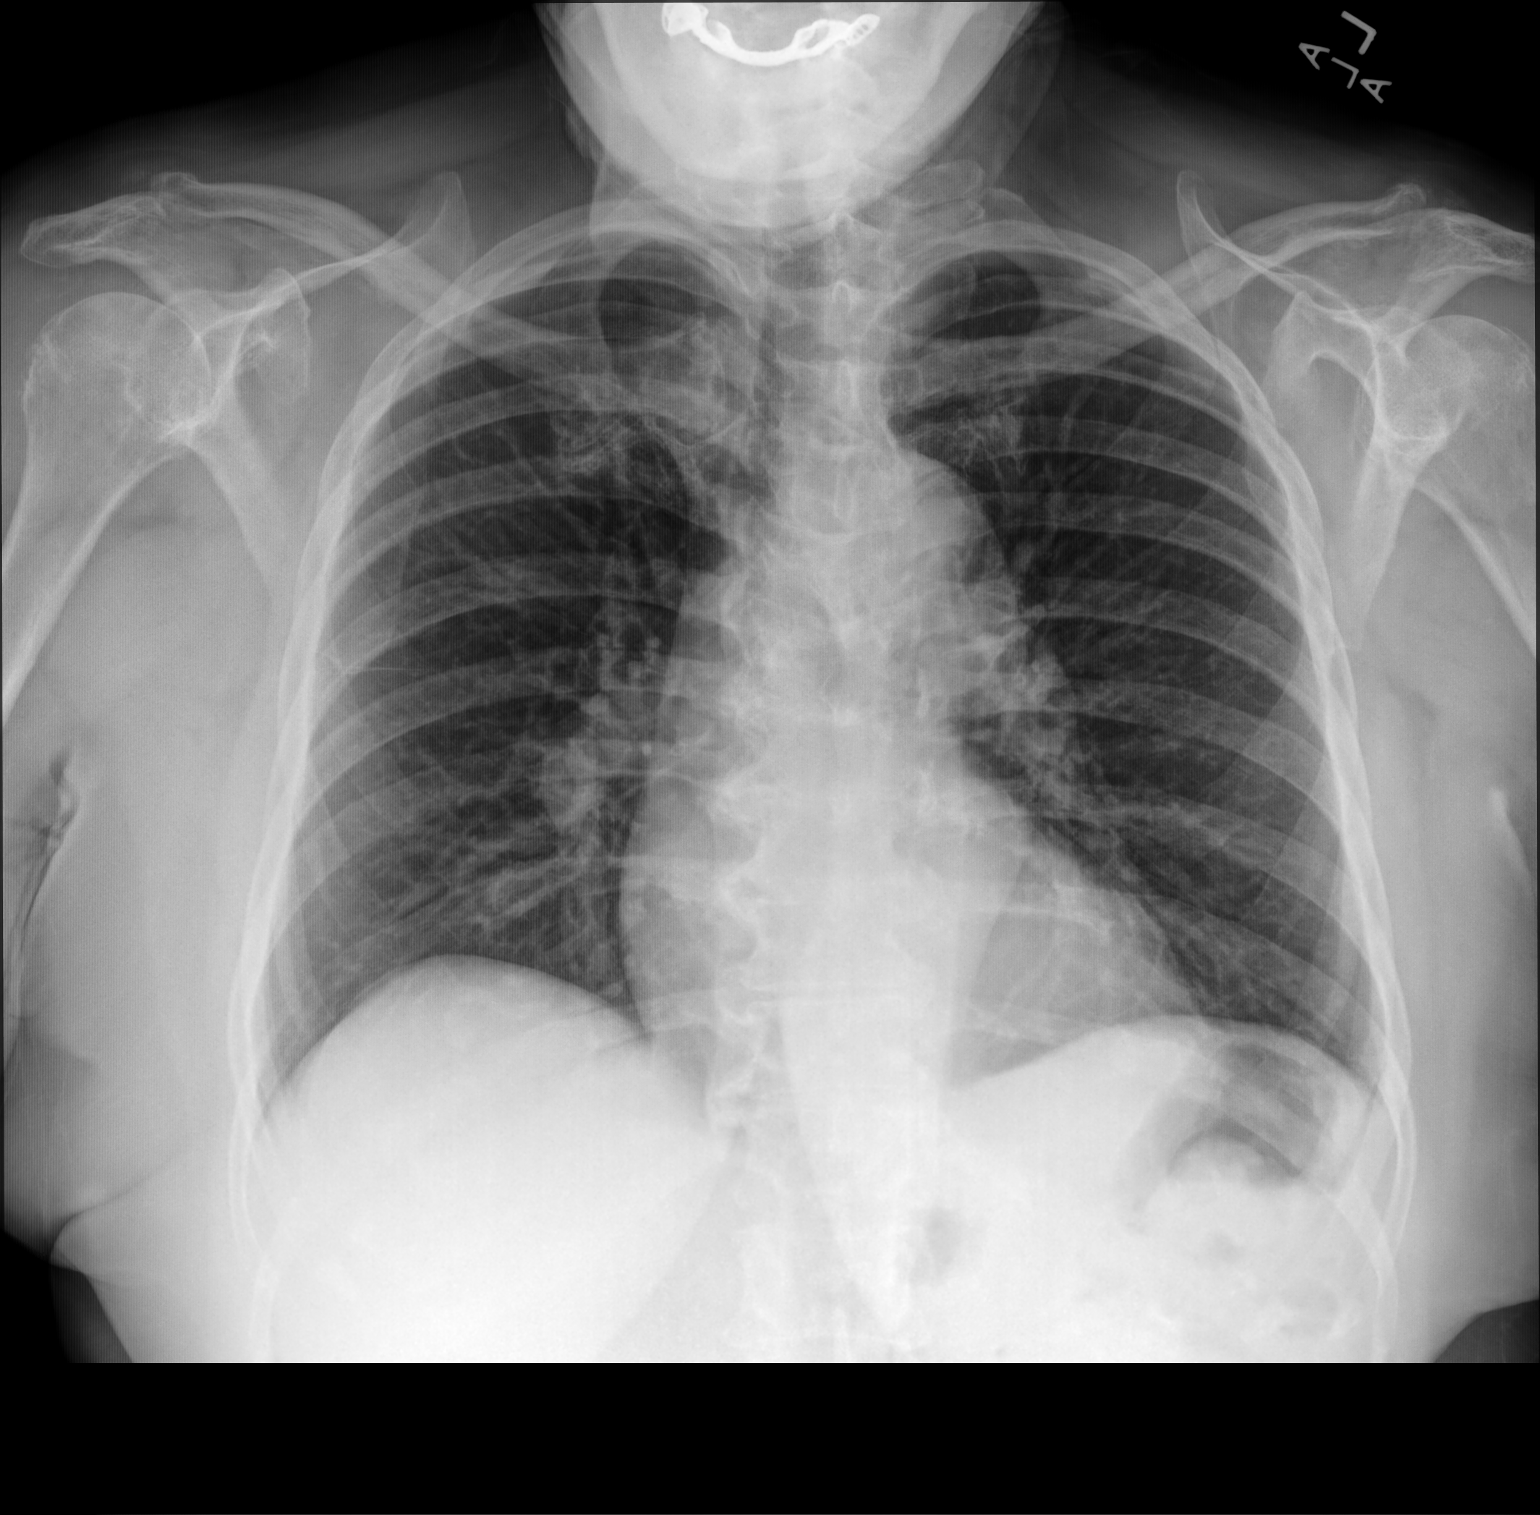

[chest lat]
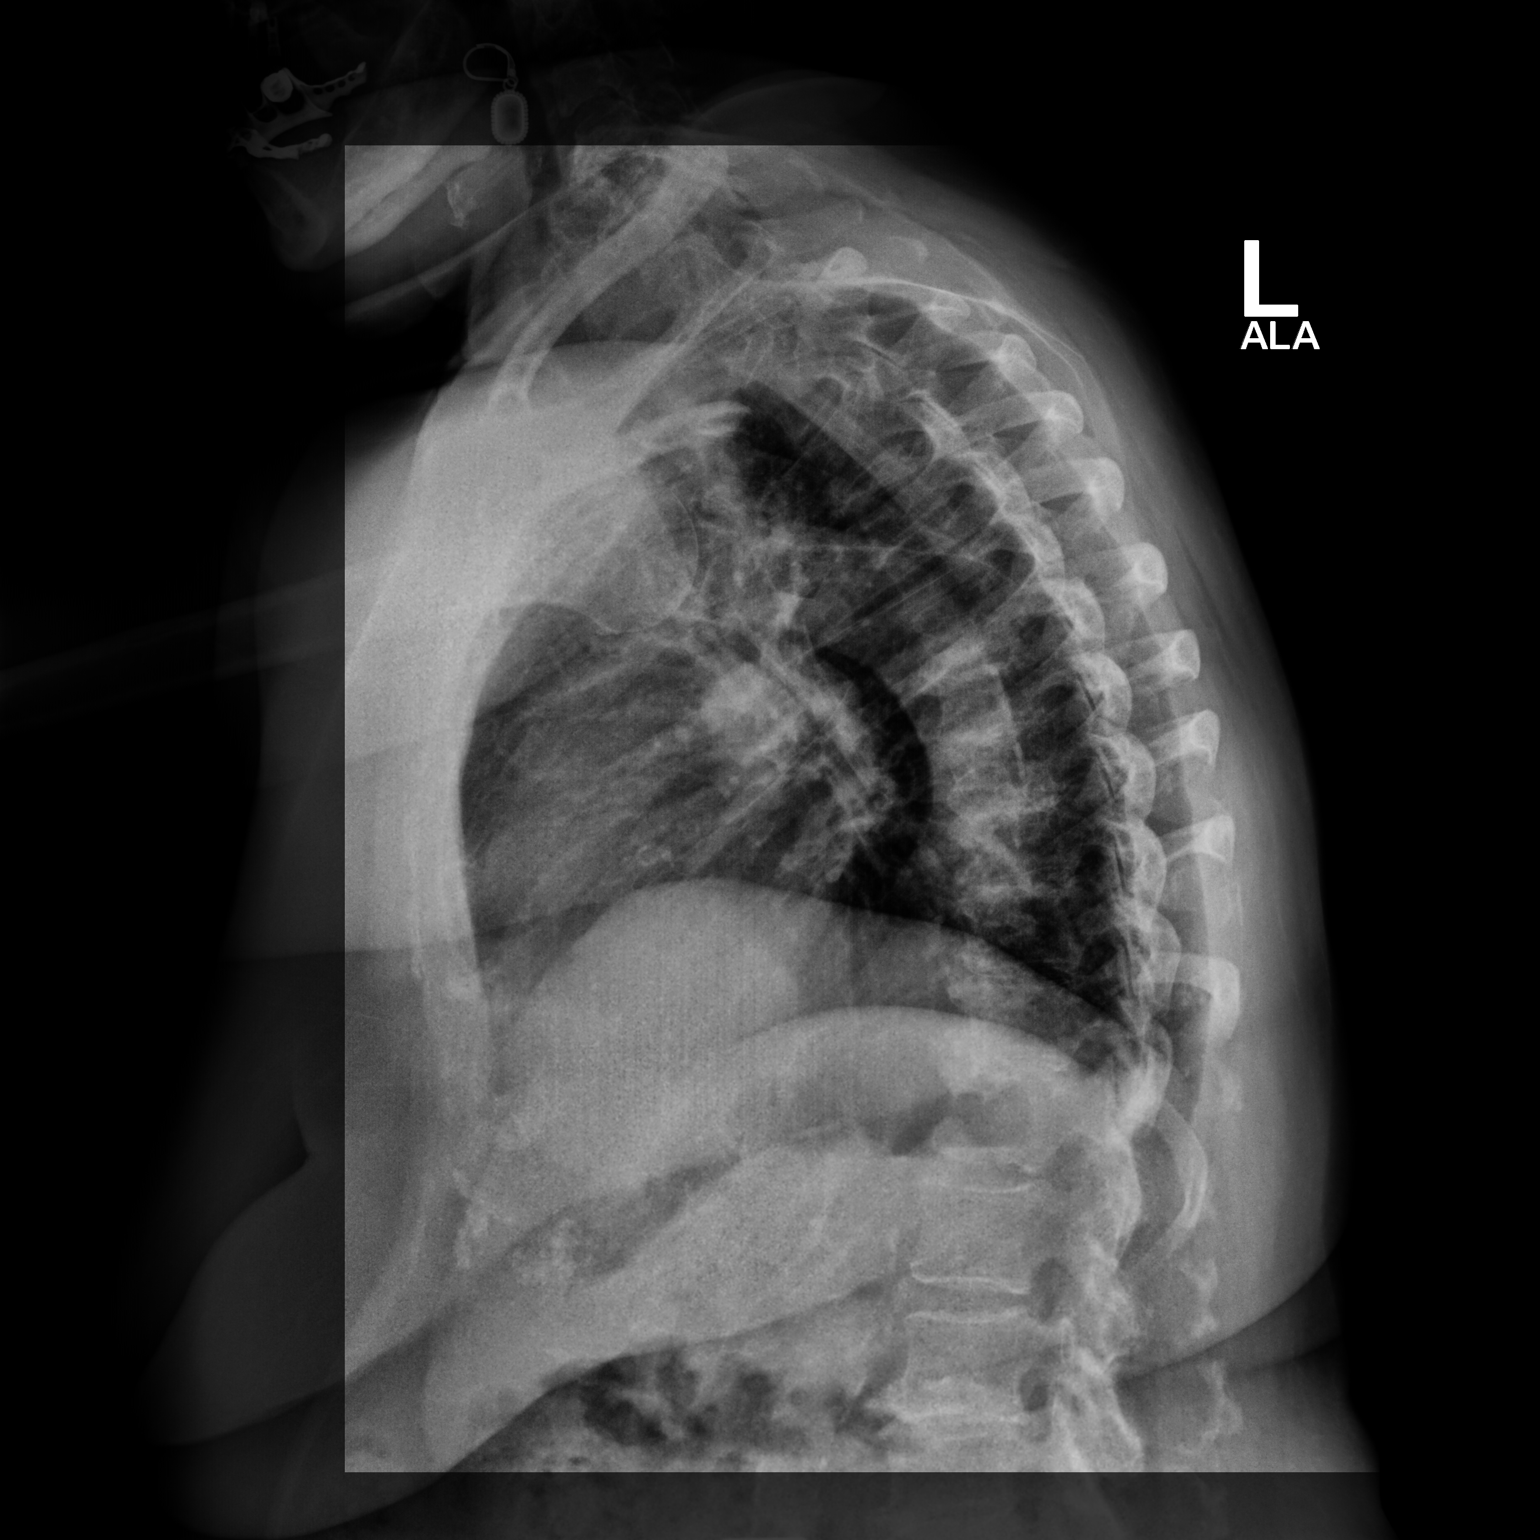

[2 of 2 positions shown; findings below may reference images not displayed]

FINDINGS: Normal heart size, mediastinal contours, and pulmonary vascularity.

Lungs clear.

No infiltrate, pleural effusion, or pneumothorax.

Osseous structures appear intact.

Scattered degenerative disc disease changes thoracic spine.
IMPRESSION: No acute abnormalities.

## 2022-03-03 ENCOUNTER — Other Ambulatory Visit: Payer: Self-pay | Admitting: Family Medicine

## 2022-03-03 DIAGNOSIS — E1140 Type 2 diabetes mellitus with diabetic neuropathy, unspecified: Secondary | ICD-10-CM

## 2022-03-04 ENCOUNTER — Ambulatory Visit (INDEPENDENT_AMBULATORY_CARE_PROVIDER_SITE_OTHER): Payer: Medicare Other | Admitting: Ophthalmology

## 2022-03-04 ENCOUNTER — Encounter (INDEPENDENT_AMBULATORY_CARE_PROVIDER_SITE_OTHER): Payer: Self-pay | Admitting: Ophthalmology

## 2022-03-04 DIAGNOSIS — H35033 Hypertensive retinopathy, bilateral: Secondary | ICD-10-CM

## 2022-03-04 DIAGNOSIS — H353132 Nonexudative age-related macular degeneration, bilateral, intermediate dry stage: Secondary | ICD-10-CM | POA: Diagnosis not present

## 2022-03-04 DIAGNOSIS — Z961 Presence of intraocular lens: Secondary | ICD-10-CM | POA: Diagnosis not present

## 2022-03-04 DIAGNOSIS — I10 Essential (primary) hypertension: Secondary | ICD-10-CM | POA: Diagnosis not present

## 2022-03-04 DIAGNOSIS — E113213 Type 2 diabetes mellitus with mild nonproliferative diabetic retinopathy with macular edema, bilateral: Secondary | ICD-10-CM | POA: Diagnosis not present

## 2022-03-04 MED ORDER — BEVACIZUMAB CHEMO INJECTION 1.25MG/0.05ML SYRINGE FOR KALEIDOSCOPE
1.2500 mg | INTRAVITREAL | Status: AC | PRN
  Administered 2022-03-04: 1.25 mg via INTRAVITREAL

## 2022-03-13 ENCOUNTER — Other Ambulatory Visit: Payer: Self-pay | Admitting: Family Medicine

## 2022-03-13 DIAGNOSIS — Z794 Long term (current) use of insulin: Secondary | ICD-10-CM

## 2022-03-27 NOTE — Progress Notes (Signed)
Cardiology Office Note:    Date:  03/28/2022   ID:  [name], DOB [birth year], MRN [id number]  PCP:  Deeann Saint, MD  Cardiologist:  Lesleigh Noe, MD   Referring MD: Deeann Saint, MD   Chief Complaint  Patient presents with   Hypertension    History of Present Illness:    Tonni Mansour is a 86 y.o. female with a hx of primary hypertension, hyperlipidemia, DM II, and palpitations.   She is here today her daughter brought her.  Orlene Erm is in many different areas.  No cardiopulmonary complaints.  There is no orthopnea.  She does have mild pitting edema.  She takes her medications as prescribed.  Past Medical History:  Diagnosis Date   Arthritis    Blood transfusion without reported diagnosis    Depression    Diabetic neuropathy (HCC)    Dizziness    Frequent headaches    Hyperlipidemia    Hypertension    Migraines    Palpitations    Urinary incontinence     Past Surgical History:  Procedure Laterality Date   none      Current Medications: Current Meds  Medication Sig   Alcohol Swabs ([facility] PREP) 70 % PADS USE FOR CHECKING BLOOD SUGAR AT LEAST 3 TIMES DAILY   Apoaequorin (PREVAGEN PO) Take 1 capsule by mouth daily.   aspirin EC 81 MG tablet Take 1 tablet (81 mg total) by mouth daily.   BD PEN NEEDLE MICRO U/F 32G X 6 MM MISC USE TO ADMINISTER INSULIN THREE TIMES DAILY   Cetirizine HCl (ZYRTEC ALLERGY PO) Take 1 tablet by mouth daily.   Coenzyme Q10 (COQ10) 200 MG CAPS Take 1 capsule by mouth daily.   Continuous Blood Gluc Sensor (FREESTYLE LIBRE 2 SENSOR) MISC Use to check blood sugars.   esomeprazole (NEXIUM) 20 MG capsule Take 20 mg by mouth daily at 12 noon.   LANTUS SOLOSTAR 100 UNIT/ML Solostar Pen INJECT 57 UNITS INTO THE SKIN AT BEDTIME.   losartan-hydrochlorothiazide (HYZAAR) 100-25 MG tablet Take 1 tablet by mouth daily.   metFORMIN (GLUCOPHAGE) 500 MG tablet Take 1 tablet (500 mg total) by mouth 2 (two) times daily.   Multiple  Vitamins-Minerals (CENTRUM PO) Take 1 tablet by mouth daily.   Naproxen Sodium (ALEVE PO) Take 2 tablets by mouth daily as needed (pain).    NOVOLOG FLEXPEN 100 UNIT/ML FlexPen INJECT 15 UNITS INTO THE SKIN 3 (THREE) TIMES DAILY WITH MEALS.   Omega-3 Fatty Acids (OMEGA 3 PO) Take 1 capsule by mouth daily.   Polyethyl Glycol-Propyl Glycol (SYSTANE ULTRA OP) Apply 1 drop to eye daily.   Probiotic Product (PROBIOTIC PO) Take 1 capsule by mouth at bedtime.   rosuvastatin (CRESTOR) 20 MG tablet TAKE 1 TABLET BY MOUTH EVERYDAY AT BEDTIME   Sennosides-Docusate Sodium (SENOKOT S PO) Take 1 tablet by mouth 2 (two) times daily.   TOPROL XL 25 MG 24 hr tablet Take 2 tablets (50 mg) in the morning and 1 tablet (25 mg) in the evening. Per [facility] this does not require a PA as it is covered by the patients plan. Thank you.   Turmeric 500 MG TABS Take 1 tablet by mouth daily.   ZOLOFT 25 MG tablet Take 1 tablet (25 mg total) by mouth daily.     Allergies:   Penicillins   Social History   Socioeconomic History   Marital status: Single    Spouse name: Not on file   Number  of children: 4   Years of education: 12   Highest education level: Not on file  Occupational History   Occupation: retired  Tobacco Use   Smoking status: Never   Smokeless tobacco: Never  Vaping Use   Vaping Use: Never used  Substance and Sexual Activity   Alcohol use: Yes   Drug use: No   Sexual activity: Not Currently  Other Topics Concern   Not on file  Social History Narrative   Not on file   Social Determinants of Health   Financial Resource Strain: Low Risk  (01/10/2022)   Overall Financial Resource Strain (CARDIA)    Difficulty of Paying Living Expenses: Not hard at all  Food Insecurity: No Food Insecurity (01/10/2022)   Hunger Vital Sign    Worried About Running Out of Food in the Last Year: Never true    Ran Out of Food in the Last Year: Never true  Transportation Needs: No Transportation Needs (01/10/2022)    PRAPARE - Administrator, Civil Service (Medical): No    Lack of Transportation (Non-Medical): No  Physical Activity: Inactive (01/10/2022)   Exercise Vital Sign    Days of Exercise per Week: 0 days    Minutes of Exercise per Session: 0 min  Stress: No Stress Concern Present (01/10/2022)   [employer] of Occupational Health - Occupational Stress Questionnaire    Feeling of Stress : Only a little  Social Connections: Socially Isolated (07/24/2020)   Social Connection and Isolation Panel [NHANES]    Frequency of Communication with Friends and Family: More than three times a week    Frequency of Social Gatherings with Friends and Family: More than three times a week    Attends Religious Services: Never    Database administrator or Organizations: No    Attends Banker Meetings: Never    Marital Status: Widowed     Family History: The patient's family history includes Diabetes in her maternal grandfather, maternal grandmother, and mother; Hypertension in her mother.  ROS:   Please see the history of present illness.    Seems to be angry.  Talks about her home situation causing stress.  Compliant with medication.  All other systems reviewed and are negative.  EKGs/Labs/Other Studies Reviewed:    The following studies were reviewed today: No recent cardiac studies  EKG:  EKG last performed 06/25/2021.  The tracing revealed left axis deviation but otherwise unremarkable.  Recent Labs: No results found for requested labs within last 365 days.  Recent Lipid Panel    Component Value Date/Time   CHOL 154 02/20/2021 1222   TRIG 126.0 02/20/2021 1222   HDL 47.00 02/20/2021 1222   CHOLHDL 3 02/20/2021 1222   VLDL 25.2 02/20/2021 1222   LDLCALC 82 02/20/2021 1222    Physical Exam:    VS:  BP (!) 170/78   Pulse 78   Ht 5\' 3"  (1.6 m)   Wt 169 lb 12.8 oz (77 kg)   SpO2 98%   BMI 30.08 kg/m     Wt Readings from Last 3 Encounters:  03/28/22 169 lb  12.8 oz (77 kg)  01/30/22 170 lb (77.1 kg)  01/10/22 170 lb (77.1 kg)  Repeat blood pressure in both arms is in the range of 145/75 mmHg.  GEN: Overweight. No acute distress HEENT: Normal NECK: No JVD. LYMPHATICS: No lymphadenopathy CARDIAC: Scratchy 1/6 right upper sternal systolic murmur. RRR S4 but no S3 gallop, with trace lower extremity edema.  VASCULAR:  Normal Pulses. No bruits. RESPIRATORY:  Clear to auscultation without rales, wheezing or rhonchi  ABDOMEN: Soft, non-tender, non-distended, No pulsatile mass, MUSCULOSKELETAL: No deformity  SKIN: Warm and dry NEUROLOGIC:  Alert and oriented x 3 PSYCHIATRIC: Decreased memory with train of thought variants.  ASSESSMENT:    1. Dementia without behavioral disturbance ([place])   2. Aortic dilatation (HCC)   3. Mixed hyperlipidemia   4. Type 2 diabetes mellitus with complication, with long-term current use of insulin ([place])   5. Essential hypertension    PLAN:    In order of problems listed above:  Exhibited behavior with consistent prior visits. Not evaluated.  Continue beta-blocker therapy for protection. Continue statin therapy with rosuvastatin Consider SGLT2 therapy.  It may also help better control blood pressure. Continue Hyzaar and metoprolol to prevent progression of aortic dilatation.   Medication Adjustments/Labs and Tests Ordered: Current medicines are reviewed at length with the patient today.  Concerns regarding medicines are outlined above.  No orders of the defined types were placed in this encounter.  No orders of the defined types were placed in this encounter.   Patient Instructions  Medication Instructions:  Your physician recommends that you continue on your current medications as directed. Please refer to the Current Medication list given to you today.  *If you need a refill on your cardiac medications before your next appointment, please call your pharmacy*  Lab  Work: NONE  Testing/Procedures: NONE  Follow-Up: At [facility], you and your health needs are our priority.  As part of our continuing mission to provide you with exceptional heart care, we have created designated Provider Care Teams.  These Care Teams include your primary Cardiologist (physician) and Advanced Practice Providers (APPs -  Physician Assistants and Nurse Practitioners) who all work together to provide you with the care you need, when you need it.  Your next appointment:   1 year(s) as needed  The format for your next appointment:   In Person  Provider:   Sinclair Grooms, MD {  Important Information About Sugar         Signed, Sinclair Grooms, MD  03/28/2022 3:58 PM    [place]

## 2022-03-28 ENCOUNTER — Encounter: Payer: Self-pay | Admitting: Interventional Cardiology

## 2022-03-28 ENCOUNTER — Ambulatory Visit (INDEPENDENT_AMBULATORY_CARE_PROVIDER_SITE_OTHER): Payer: Medicare Other | Admitting: Interventional Cardiology

## 2022-03-28 VITALS — BP 170/78 | HR 78 | Ht 63.0 in | Wt 169.8 lb

## 2022-03-28 DIAGNOSIS — I77819 Aortic ectasia, unspecified site: Secondary | ICD-10-CM

## 2022-03-28 DIAGNOSIS — E782 Mixed hyperlipidemia: Secondary | ICD-10-CM

## 2022-03-28 DIAGNOSIS — I10 Essential (primary) hypertension: Secondary | ICD-10-CM | POA: Diagnosis not present

## 2022-03-28 DIAGNOSIS — Z794 Long term (current) use of insulin: Secondary | ICD-10-CM | POA: Diagnosis not present

## 2022-03-28 DIAGNOSIS — E118 Type 2 diabetes mellitus with unspecified complications: Secondary | ICD-10-CM

## 2022-03-28 DIAGNOSIS — I719 Aortic aneurysm of unspecified site, without rupture: Secondary | ICD-10-CM

## 2022-03-28 DIAGNOSIS — F0390 Unspecified dementia without behavioral disturbance: Secondary | ICD-10-CM

## 2022-03-28 NOTE — Patient Instructions (Signed)
Medication Instructions:  Your physician recommends that you continue on your current medications as directed. Please refer to the Current Medication list given to you today.  *If you need a refill on your cardiac medications before your next appointment, please call your pharmacy*  Lab Work: NONE  Testing/Procedures: NONE  Follow-Up: At [facility], you and your health needs are our priority.  As part of our continuing mission to provide you with exceptional heart care, we have created designated Provider Care Teams.  These Care Teams include your primary Cardiologist (physician) and Advanced Practice Providers (APPs -  Physician Assistants and Nurse Practitioners) who all work together to provide you with the care you need, when you need it.  Your next appointment:   1 year(s) as needed  The format for your next appointment:   In Person  Provider:   Henry W Smith III, MD {   Important Information About Sugar       

## 2022-03-31 ENCOUNTER — Encounter: Payer: Self-pay | Admitting: Family Medicine

## 2022-03-31 ENCOUNTER — Ambulatory Visit (INDEPENDENT_AMBULATORY_CARE_PROVIDER_SITE_OTHER): Payer: Medicare Other | Admitting: Family Medicine

## 2022-03-31 VITALS — BP 156/76 | HR 67 | Temp 98.1°F | Wt 170.2 lb

## 2022-03-31 DIAGNOSIS — F0390 Unspecified dementia without behavioral disturbance: Secondary | ICD-10-CM

## 2022-03-31 DIAGNOSIS — I10 Essential (primary) hypertension: Secondary | ICD-10-CM | POA: Diagnosis not present

## 2022-03-31 DIAGNOSIS — E1140 Type 2 diabetes mellitus with diabetic neuropathy, unspecified: Secondary | ICD-10-CM

## 2022-03-31 DIAGNOSIS — Z794 Long term (current) use of insulin: Secondary | ICD-10-CM

## 2022-03-31 MED ORDER — DAPAGLIFLOZIN PROPANEDIOL 5 MG PO TABS: 5.0000 mg | ORAL_TABLET | Freq: Every day | ORAL | 3 refills | Status: DC

## 2022-03-31 NOTE — Patient Instructions (Addendum)
At today's visit we discussed starting a new medication called Farxiga 5 mg to help with your blood sugar.  This medication can also help with your blood pressure and overall heart health.  As we are adjusting medications hold NovoLog 24 units in a.m. and p.m (do not throw away the novolog yet as we may need it).    Continue Lantus 30 units once daily and metformin 500 mg twice daily. Continue monitoring your blood sugar.  A referral for you to see the neurologist for memory/brain health was placed.  You should expect a phone call about scheduling an appointment for this referral.  We will have you follow-up in 1 month to see how your blood sugar is doing.  Notify clinic if you notice any low or high blood sugars until your next appointment.

## 2022-03-31 NOTE — Progress Notes (Signed)
Subjective:    Patient ID: Karen Powell, female    DOB: 1936-06-27, 86 y.o.   MRN: [id number]  Chief Complaint  Patient presents with   Follow-up   Diabetes   Hypertension    HPI Patient was seen today for f/u.  Pt states she has been doing well.  Pt apologiezed for last OFV as she was arguing with her daughter.  Pt states she has been taking her medications as prescribed.  Has bs readings: 147, 186, 284, 154, 144, 162, 199, 273, 245, 188.  Continue Lantus 30 units BID and NovoLog 24 units in a.m. and pm.  Patient advised Lantus was rx'd as once a day at bedtime.  Patient inquires about what she can use for hyperpigmentation on skin.  Past Medical History:  Diagnosis Date   Arthritis    Blood transfusion without reported diagnosis    Depression    Diabetic neuropathy (HCC)    Dizziness    Frequent headaches    Hyperlipidemia    Hypertension    Migraines    Palpitations    Urinary incontinence     Allergies  Allergen Reactions   Penicillins Anaphylaxis    ROS Limited 2/2 history of dementia. General: Denies fever, chills, night sweats, changes in weight, changes in appetite     Objective:    Blood pressure (!) 156/76, pulse 67, temperature 98.1 F (36.7 C), temperature source Oral, weight 170 lb 3.2 oz (77.2 kg), SpO2 95 %.  Gen. Pleasant, well-nourished, in no distress, normal affect   HEENT: [place]/AT, face symmetric, conjunctiva clear, no scleral icterus, PERRLA, EOMI, nares patent without drainage Lungs: no accessory muscle use, CTAB, no wheezes or rales Cardiovascular: RRR, no m/r/g, no peripheral edema Musculoskeletal: No deformities, no cyanosis or clubbing, normal tone Neuro:  A&Ox3, CN II-XII intact, normal gait Skin:  Warm, no lesions/ rash.  Flat hyperpigmentation on bilateral upper and lower extremities.   Wt Readings from Last 3 Encounters:  03/31/22 170 lb 3.2 oz (77.2 kg)  03/28/22 169 lb 12.8 oz (77 kg)  01/30/22 170 lb (77.1 kg)    Lab Results   Component Value Date   WBC 5.0 02/20/2021   HGB 12.9 02/20/2021   HCT 39.1 02/20/2021   PLT 202.0 02/20/2021   GLUCOSE 433 (H) 02/20/2021   CHOL 154 02/20/2021   TRIG 126.0 02/20/2021   HDL 47.00 02/20/2021   LDLCALC 82 02/20/2021   ALT 16 02/20/2021   AST 20 02/20/2021   NA 141 02/20/2021   K 4.3 02/20/2021   CL 103 02/20/2021   CREATININE 0.99 02/20/2021   BUN 26 (H) 02/20/2021   CO2 19 02/20/2021   TSH 1.03 02/20/2021   HGBA1C 6.8 (A) 12/06/2021    Assessment/Plan:  Type 2 diabetes mellitus with diabetic neuropathy, with long-term current use of insulin (HCC)  -Hemoglobin A1c 6.8% on 01/06/2022 -Discussed making medication regimen easier for patient by limiting medications if possible. -Discussed starting Farxiga 5 mg daily to help with blood sugar and heart health.  Patient advised to hold NovoLog 24 units in a.m. and p.m. -We will have patient continue Lantus 38 units nightly and metformin 500 mg twice daily. -Instructions written out for patient and applied to medication bottles/insulin pen. -Diabetic retinopathy screening done 12/30/2021 -Foot exam and urine microalbumin/creatinine ratio due at next office visit -Continue Crestor 20 mg and ARB. - Plan: dapagliflozin propanediol (FARXIGA) 5 MG TABS tablet  Essential hypertension -Elevated -Discussed the importance of lifestyle modifications -Starting Farxiga which may  help lower blood pressure. -Continue current medications including losartan-hydrochlorothiazide 100-25mg  dail, Toprol-XL 50 mg in a.m. and 25 mg in pm   Dementia without behavioral disturbance (HCC) -ongoing memory change noted based on conversations during visits.  Pt previously reluctant, but now open to seeing Neruology for further evaluation.  - Plan: Ambulatory referral to Neurology -continue close f/u  F/u in 1-2 months  Abbe Amsterdam, MD

## 2022-04-04 NOTE — Progress Notes (Signed)
[facility] Note  04/07/2022     CHIEF COMPLAINT Patient presents for Retina Follow Up   HISTORY OF PRESENT ILLNESS: Karen Powell is a 86 y.o. female who presents to the clinic today for:   HPI     Retina Follow Up   Patient presents with  Diabetic Retinopathy.  In both eyes.  Severity is moderate.  Duration of 4 weeks.  Since onset it is stable.  I, the attending physician,  performed the HPI with the patient and updated documentation appropriately.        Comments   4 week Retina follow up. Patient states she has noticed a black dot in her eye that moves. Patient blood sugar 247      Last edited by Bernarda Caffey, MD on 04/07/2022  3:53 PM.    Pt states she has a black dot in her vision that has come and gone since her last injection  Referring physician: Billie Ruddy, MD [place],  [place] [address]  HISTORICAL INFORMATION:   Selected notes from the MEDICAL RECORD NUMBER Referred by Dr. Lucianne Lei for mac edema LEE: 07/04/21 BCVA OD: 20/40 OS: 20/100 Ocular Hx- amblyopia, macular edema OS PMH- DM, HTN, migraines, high cholesterol; last a1c was 8.2 on 07/11/21    CURRENT MEDICATIONS: Current Outpatient Medications (Ophthalmic Drugs)  Medication Sig   Polyethyl Glycol-Propyl Glycol (SYSTANE ULTRA OP) Apply 1 drop to eye daily.   No current facility-administered medications for this visit. (Ophthalmic Drugs)   Current Outpatient Medications (Other)  Medication Sig   Alcohol Swabs ([employer] PREP) 70 % PADS USE FOR CHECKING BLOOD SUGAR AT LEAST 3 TIMES DAILY   Apoaequorin (PREVAGEN PO) Take 1 capsule by mouth daily.   aspirin EC 81 MG tablet Take 1 tablet (81 mg total) by mouth daily.   BD PEN NEEDLE MICRO U/F 32G X 6 MM MISC USE TO ADMINISTER INSULIN THREE TIMES DAILY   Cetirizine HCl (ZYRTEC ALLERGY PO) Take 1 tablet by mouth daily.   Coenzyme Q10 (COQ10) 200 MG CAPS Take 1 capsule by mouth daily.   Continuous Blood Gluc  Sensor (FREESTYLE LIBRE 2 SENSOR) MISC Use to check blood sugars.   dapagliflozin propanediol (FARXIGA) 5 MG TABS tablet Take 1 tablet (5 mg total) by mouth daily before breakfast.   esomeprazole (NEXIUM) 20 MG capsule Take 20 mg by mouth daily at 12 noon.   LANTUS SOLOSTAR 100 UNIT/ML Solostar Pen INJECT 57 UNITS INTO THE SKIN AT BEDTIME.   losartan-hydrochlorothiazide (HYZAAR) 100-25 MG tablet Take 1 tablet by mouth daily.   metFORMIN (GLUCOPHAGE) 500 MG tablet Take 1 tablet (500 mg total) by mouth 2 (two) times daily.   Multiple Vitamins-Minerals (CENTRUM PO) Take 1 tablet by mouth daily.   Naproxen Sodium (ALEVE PO) Take 2 tablets by mouth daily as needed (pain).    NOVOLOG FLEXPEN 100 UNIT/ML FlexPen INJECT 15 UNITS INTO THE SKIN 3 (THREE) TIMES DAILY WITH MEALS.   Omega-3 Fatty Acids (OMEGA 3 PO) Take 1 capsule by mouth daily.   Probiotic Product (PROBIOTIC PO) Take 1 capsule by mouth at bedtime.   rosuvastatin (CRESTOR) 20 MG tablet TAKE 1 TABLET BY MOUTH EVERYDAY AT BEDTIME   Sennosides-Docusate Sodium (SENOKOT S PO) Take 1 tablet by mouth 2 (two) times daily.   TOPROL XL 25 MG 24 hr tablet Take 2 tablets (50 mg) in the morning and 1 tablet (25 mg) in the evening. Per [facility] this does  not require a PA as it is covered by the patients plan. Thank you.   Turmeric 500 MG TABS Take 1 tablet by mouth daily.   ZOLOFT 25 MG tablet Take 1 tablet (25 mg total) by mouth daily.   No current facility-administered medications for this visit. (Other)   REVIEW OF SYSTEMS: ROS   Positive for: Endocrine, Cardiovascular, Eyes Negative for: Constitutional, Gastrointestinal, Neurological, Skin, Genitourinary, Musculoskeletal, HENT, Respiratory, Psychiatric, Allergic/Imm, Heme/Lymph Last edited by Elmore Guise, COT on 04/07/2022  1:40 PM.       ALLERGIES Allergies  Allergen Reactions   Penicillins Anaphylaxis   PAST MEDICAL HISTORY Past Medical History:  Diagnosis Date   Arthritis     Blood transfusion without reported diagnosis    Depression    Diabetic neuropathy ([place])    Dizziness    Frequent headaches    Hyperlipidemia    Hypertension    Migraines    Palpitations    Urinary incontinence    Past Surgical History:  Procedure Laterality Date   none     FAMILY HISTORY Family History  Problem Relation Age of Onset   Diabetes Mother    Hypertension Mother    Diabetes Maternal Grandmother    Diabetes Maternal Grandfather    SOCIAL HISTORY Social History   Tobacco Use   Smoking status: Never   Smokeless tobacco: Never  Vaping Use   Vaping Use: Never used  Substance Use Topics   Alcohol use: Yes   Drug use: No       OPHTHALMIC EXAM:  Base Eye Exam     Visual Acuity (Snellen - Linear)       Right Left   Dist [place] 20/40-2 20/80-2   Dist ph [place] 20/30-2 20/70-3         Tonometry (Tonopen, 1:50 PM)       Right Left   Pressure 8 11         Pupils       Dark Light Shape React APD   Right 3 2 Round Minimal None   Left 3 2 Round Minimal None         Visual Fields (Counting fingers)       Left Right    Full Full         Extraocular Movement       Right Left    Full, Ortho Full, Ortho         Neuro/Psych     Oriented x3: Yes   Mood/Affect: Normal         Dilation     Both eyes: 1.0% Mydriacyl, 2.5% Phenylephrine @ 1:51 PM           Slit Lamp and Fundus Exam     Slit Lamp Exam       Right Left   Lids/Lashes Dermatochalasis - upper lid Dermatochalasis - upper lid   Conjunctiva/Sclera nasal and temporal pinguecula, mild melanosis nasal and temporal pinguecula, mild melanosis   Cornea 2+ Punctate epithelial erosions, mild arcus 2+ Punctate epithelial erosions, mild arcus   Anterior Chamber Deep and quiet Deep and quiet   Iris Round and dilated Round and dilated   Lens 3 piece PC IOL, open PC 3 piece PC IOL, open PC   Anterior Vitreous Vitreous syneresis, Posterior vitreous detachment, mild Asteroid hyalosis  inferiorly Vitreous syneresis, Posterior vitreous detachment         Fundus Exam       Right Left   Disc Pink  and Sharp, mild PPA/PPP Pink and Sharp, mild temporal PPA   C/D Ratio 0.3 0.5   Macula Flat, Good foveal reflex, rare MA, cluster of drusen ST mac Blunted foveal reflex, central cystic changes - persistent, +MA, pigmented CR scarring, mild ERM temporal macula   Vessels attenuated, Tortuous attenuated, Tortuous   Periphery Attached, mild midzonal drusen, rare MA Attached, mild midzonal drusen, mild reticular degeneration, rare MA           IMAGING AND PROCEDURES  Imaging and Procedures for 04/07/2022  OCT, Retina - OU - Both Eyes       Right Eye Quality was good. Central Foveal Thickness: 251. Progression has been stable. Findings include normal foveal contour, no IRF, no SRF (Rare drusen).   Left Eye Quality was good. Central Foveal Thickness: 315. Progression has been stable. Findings include no SRF, abnormal foveal contour, intraretinal hyper-reflective material, intraretinal fluid (persistent IRF/IRHM temporal fovea).   Notes *Images captured and stored on drive  Diagnosis / Impression:  OD: NFP; no IRF/SRF OS: persistent IRF/IRHM temporal fovea  Clinical management:  See below  Abbreviations: NFP - Normal foveal profile. CME - cystoid macular edema. PED - pigment epithelial detachment. IRF - intraretinal fluid. SRF - subretinal fluid. EZ - ellipsoid zone. ERM - epiretinal membrane. ORA - outer retinal atrophy. ORT - outer retinal tubulation. SRHM - subretinal hyper-reflective material. IRHM - intraretinal hyper-reflective material      Intravitreal Injection, Pharmacologic Agent - OS - Left Eye       Time Out 04/07/2022. 2:41 PM. Confirmed correct patient, procedure, site, and patient consented.   Anesthesia Topical anesthesia was used. Anesthetic medications included Lidocaine 2%, Proparacaine 0.5%.   Procedure Preparation included 5% betadine to  ocular surface, eyelid speculum. A (32g) needle was used.   Injection: 2 mg aflibercept 2 MG/0.05ML   Route: Intravitreal, Site: Left Eye   NDC: A3590391, Lot: [id number], Expiration date: 01/20/2023, Waste: 0 mL   Post-op Post injection exam found visual acuity of at least counting fingers. The patient tolerated the procedure well. There were no complications. The patient received written and verbal post procedure care education. Post injection medications were not given.            ASSESSMENT/PLAN:    ICD-10-CM   1. Both eyes affected by mild nonproliferative diabetic retinopathy with macular edema, associated with type 2 diabetes mellitus (HCC)  Y18.5631 OCT, Retina - OU - Both Eyes    Intravitreal Injection, Pharmacologic Agent - OS - Left Eye    aflibercept (EYLEA) SOLN 2 mg    2. Intermediate stage nonexudative age-related macular degeneration of both eyes  H35.3132     3. Essential hypertension  I10     4. Hypertensive retinopathy of both eyes  H35.033     5. Pseudophakia, both eyes  Z96.1      Mild Non-proliferative diabetic retinopathy, both eyes - last A1c 6.8 on 3.17.23 from 8.2 on 10.20.22  - IVA OS #1 (02.21.23), #2 (03.21.23), #3 (04.18.22), #4 (05.16.23), #5 (06.13.23) - OD: without DME - OS: +DME - exam shows rare MA OU - unable to obtain FA -- poor IV access - BCVA OS 20/70 - stable - OCT shows persistent IRF/edema OS -- ?IVA resistance - discussed possible switch in medication for persistent IRF - recommend IVE OS #1 today, 07.17.23  - pt wishes to proceed with injection - RBA of procedure discussed, questions answered - IVE informed consent obtained and signed for IVA OS on 07.17.23 -  see procedure note - f/u in 4 wks -- DFE/OCT, possible injection  2. Age related macular degeneration, non-exudative, OU  - intermediate stage with mild midzonal drusen - The incidence, anatomy, and pathology of dry AMD, risk of progression, and the AREDS and AREDS  2 studies including smoking risks discussed with patient. - continue Amsler grid monitoring  3,4. Hypertensive retinopathy OU - discussed importance of tight BP control - monitor  5. Pseudophakia OU  - s/p CE/IOL OU  - 3-piece IOLs in good position, doing well  - monitor   Ophthalmic Meds Ordered this visit:  Meds ordered this encounter  Medications   aflibercept (EYLEA) SOLN 2 mg     Return in about 4 weeks (around 05/05/2022) for f/u NPDR OU, DFE, OCT.  There are no Patient Instructions on file for this visit.   Explained the diagnoses, plan, and follow up with the patient and they expressed understanding.  Patient expressed understanding of the importance of proper follow up care.   This document serves as a record of services personally performed by Gardiner Sleeper, MD, PhD. It was created on their behalf by Roselee Nova, COMT. The creation of this record is the provider's dictation and/or activities during the visit.  Electronically signed by: Roselee Nova, COMT 04/07/22 3:55 PM  This document serves as a record of services personally performed by Gardiner Sleeper, MD, PhD. It was created on their behalf by San Jetty. Owens Shark, OA an ophthalmic technician. The creation of this record is the provider's dictation and/or activities during the visit.    Electronically signed by: San Jetty. Owens Shark, [place] 07.17.2023 3:55 PM   Gardiner Sleeper, M.D., Ph.D. Diseases & Surgery of the Retina and Vitreous [facility]  I have reviewed the above documentation for accuracy and completeness, and I agree with the above. Gardiner Sleeper, M.D., Ph.D. 04/07/22 3:56 PM   Abbreviations: M myopia (nearsighted); A astigmatism; H hyperopia (farsighted); P presbyopia; Mrx spectacle prescription;  CTL contact lenses; OD right eye; OS left eye; OU both eyes  XT exotropia; ET esotropia; PEK punctate epithelial keratitis; PEE punctate epithelial erosions; DES dry eye syndrome; MGD  meibomian gland dysfunction; ATs artificial tears; PFAT's preservative free artificial tears; [place] nuclear sclerotic cataract; PSC posterior subcapsular cataract; ERM epi-retinal membrane; PVD posterior vitreous detachment; RD retinal detachment; DM diabetes mellitus; DR diabetic retinopathy; NPDR non-proliferative diabetic retinopathy; PDR proliferative diabetic retinopathy; CSME clinically significant macular edema; DME diabetic macular edema; dbh dot blot hemorrhages; CWS cotton wool spot; POAG primary open angle glaucoma; C/D cup-to-disc ratio; HVF humphrey visual field; GVF goldmann visual field; OCT optical coherence tomography; IOP intraocular pressure; BRVO Branch retinal vein occlusion; CRVO central retinal vein occlusion; CRAO central retinal artery occlusion; BRAO branch retinal artery occlusion; RT retinal tear; SB scleral buckle; PPV pars plana vitrectomy; VH Vitreous hemorrhage; PRP panretinal laser photocoagulation; IVK intravitreal kenalog; VMT vitreomacular traction; MH Macular hole;  NVD neovascularization of the disc; NVE neovascularization elsewhere; AREDS age related eye disease study; ARMD age related macular degeneration; POAG primary open angle glaucoma; EBMD epithelial/anterior basement membrane dystrophy; ACIOL anterior chamber intraocular lens; IOL intraocular lens; PCIOL posterior chamber intraocular lens; Phaco/IOL phacoemulsification with intraocular lens placement; [place] photorefractive keratectomy; LASIK laser assisted in situ keratomileusis; HTN hypertension; DM diabetes mellitus; COPD chronic obstructive pulmonary disease

## 2022-04-07 ENCOUNTER — Ambulatory Visit (INDEPENDENT_AMBULATORY_CARE_PROVIDER_SITE_OTHER): Payer: Medicare Other | Admitting: Ophthalmology

## 2022-04-07 ENCOUNTER — Encounter (INDEPENDENT_AMBULATORY_CARE_PROVIDER_SITE_OTHER): Payer: Self-pay | Admitting: Ophthalmology

## 2022-04-07 DIAGNOSIS — I10 Essential (primary) hypertension: Secondary | ICD-10-CM | POA: Diagnosis not present

## 2022-04-07 DIAGNOSIS — E113213 Type 2 diabetes mellitus with mild nonproliferative diabetic retinopathy with macular edema, bilateral: Secondary | ICD-10-CM

## 2022-04-07 DIAGNOSIS — H35033 Hypertensive retinopathy, bilateral: Secondary | ICD-10-CM | POA: Diagnosis not present

## 2022-04-07 DIAGNOSIS — Z961 Presence of intraocular lens: Secondary | ICD-10-CM

## 2022-04-07 DIAGNOSIS — H353132 Nonexudative age-related macular degeneration, bilateral, intermediate dry stage: Secondary | ICD-10-CM

## 2022-04-07 MED ORDER — AFLIBERCEPT 2MG/0.05ML IZ SOLN FOR KALEIDOSCOPE
2.0000 mg | INTRAVITREAL | Status: AC | PRN
  Administered 2022-04-07: 2 mg via INTRAVITREAL

## 2022-04-30 NOTE — Progress Notes (Signed)
[facility] Note  05/05/2022     CHIEF COMPLAINT Patient presents for Retina Follow Up   HISTORY OF PRESENT ILLNESS: Karen Powell is a 86 y.o. female who presents to the clinic today for:   HPI     Retina Follow Up   Patient presents with  Diabetic Retinopathy (IVE OS 07.17.23).  In both eyes.  This started months ago.  Severity is moderate.  Duration of 4 weeks.  Since onset it is stable.  I, the attending physician,  performed the HPI with the patient and updated documentation appropriately.        Comments   Patient feels that the vision is the same. She states that after she got the Eylea injection she has seen black spots. Her blood sugar was 323 and she is unsure of her A1C.      Last edited by Bernarda Caffey, MD on 05/05/2022  2:04 PM.    Pt states she has switched medications for her blood sugar  Referring physician: Billie Ruddy, MD [place],  [place] [address]  HISTORICAL INFORMATION:   Selected notes from the MEDICAL RECORD NUMBER Referred by Dr. Lucianne Lei for mac edema LEE: 07/04/21 BCVA OD: 20/40 OS: 20/100 Ocular Hx- amblyopia, macular edema OS PMH- DM, HTN, migraines, high cholesterol; last a1c was 8.2 on 07/11/21    CURRENT MEDICATIONS: Current Outpatient Medications (Ophthalmic Drugs)  Medication Sig   Polyethyl Glycol-Propyl Glycol (SYSTANE ULTRA OP) Apply 1 drop to eye daily.   No current facility-administered medications for this visit. (Ophthalmic Drugs)   Current Outpatient Medications (Other)  Medication Sig   Alcohol Swabs ([facility] PREP) 70 % PADS USE FOR CHECKING BLOOD SUGAR AT LEAST 3 TIMES DAILY   Apoaequorin (PREVAGEN PO) Take 1 capsule by mouth daily.   aspirin EC 81 MG tablet Take 1 tablet (81 mg total) by mouth daily.   BD PEN NEEDLE MICRO U/F 32G X 6 MM MISC USE TO ADMINISTER INSULIN THREE TIMES DAILY   Cetirizine HCl (ZYRTEC ALLERGY PO) Take 1 tablet by mouth daily.   Coenzyme Q10 (COQ10)  200 MG CAPS Take 1 capsule by mouth daily.   Continuous Blood Gluc Sensor (FREESTYLE LIBRE 2 SENSOR) MISC Use to check blood sugars.   dapagliflozin propanediol (FARXIGA) 5 MG TABS tablet Take 1 tablet (5 mg total) by mouth daily before breakfast.   esomeprazole (NEXIUM) 20 MG capsule Take 20 mg by mouth daily at 12 noon.   LANTUS SOLOSTAR 100 UNIT/ML Solostar Pen INJECT 57 UNITS INTO THE SKIN AT BEDTIME.   losartan-hydrochlorothiazide (HYZAAR) 100-25 MG tablet Take 1 tablet by mouth daily.   metFORMIN (GLUCOPHAGE) 500 MG tablet Take 1 tablet (500 mg total) by mouth 2 (two) times daily.   Multiple Vitamins-Minerals (CENTRUM PO) Take 1 tablet by mouth daily.   Naproxen Sodium (ALEVE PO) Take 2 tablets by mouth daily as needed (pain).    NOVOLOG FLEXPEN 100 UNIT/ML FlexPen INJECT 15 UNITS INTO THE SKIN 3 (THREE) TIMES DAILY WITH MEALS.   Omega-3 Fatty Acids (OMEGA 3 PO) Take 1 capsule by mouth daily.   Probiotic Product (PROBIOTIC PO) Take 1 capsule by mouth at bedtime.   rosuvastatin (CRESTOR) 20 MG tablet TAKE 1 TABLET BY MOUTH EVERYDAY AT BEDTIME   Sennosides-Docusate Sodium (SENOKOT S PO) Take 1 tablet by mouth 2 (two) times daily.   TOPROL XL 25 MG 24 hr tablet Take 2 tablets (50 mg) in the morning and 1  tablet (25 mg) in the evening. Per [facility] this does not require a PA as it is covered by the patients plan. Thank you.   Turmeric 500 MG TABS Take 1 tablet by mouth daily.   ZOLOFT 25 MG tablet Take 1 tablet (25 mg total) by mouth daily.   No current facility-administered medications for this visit. (Other)   REVIEW OF SYSTEMS: ROS   Positive for: Endocrine, Cardiovascular, Eyes Negative for: Constitutional, Gastrointestinal, Neurological, Skin, Genitourinary, Musculoskeletal, HENT, Respiratory, Psychiatric, Allergic/Imm, Heme/Lymph Last edited by Annie Paras, COT on 05/05/2022  1:11 PM.     ALLERGIES Allergies  Allergen Reactions   Penicillins Anaphylaxis   PAST  MEDICAL HISTORY Past Medical History:  Diagnosis Date   Arthritis    Blood transfusion without reported diagnosis    Depression    Diabetic neuropathy ([place])    Dizziness    Frequent headaches    Hyperlipidemia    Hypertension    Migraines    Palpitations    Urinary incontinence    Past Surgical History:  Procedure Laterality Date   none     FAMILY HISTORY Family History  Problem Relation Age of Onset   Diabetes Mother    Hypertension Mother    Diabetes Maternal Grandmother    Diabetes Maternal Grandfather    SOCIAL HISTORY Social History   Tobacco Use   Smoking status: Never   Smokeless tobacco: Never  Vaping Use   Vaping Use: Never used  Substance Use Topics   Alcohol use: Yes   Drug use: No       OPHTHALMIC EXAM:  Base Eye Exam     Visual Acuity (Snellen - Linear)       Right Left   Dist [place] 20/50 20/100   Dist ph [place] 20/30 20/80 +1         Tonometry (Tonopen, 1:19 PM)       Right Left   Pressure 12 15         Pupils       Dark Light Shape React APD   Right 3 2 Round Minimal None   Left 3 2 Round Minimal None         Visual Fields       Left Right    Full Full         Extraocular Movement       Right Left    Full, Ortho Full, Ortho         Neuro/Psych     Oriented x3: Yes   Mood/Affect: Normal         Dilation     Both eyes: 1.0% Mydriacyl, 2.5% Phenylephrine @ 1:13 PM           Slit Lamp and Fundus Exam     Slit Lamp Exam       Right Left   Lids/Lashes Dermatochalasis - upper lid Dermatochalasis - upper lid   Conjunctiva/Sclera nasal and temporal pinguecula, mild melanosis nasal and temporal pinguecula, mild melanosis   Cornea 2+ Punctate epithelial erosions, mild arcus 2+ Punctate epithelial erosions, mild arcus   Anterior Chamber Deep and quiet Deep and quiet   Iris Round and dilated Round and dilated   Lens 3 piece PC IOL, open PC 3 piece PC IOL, open PC   Anterior Vitreous Vitreous syneresis,  Posterior vitreous detachment, mild Asteroid hyalosis inferiorly Vitreous syneresis, Posterior vitreous detachment         Fundus Exam  Right Left   Disc Pink and Sharp, mild PPA/PPP Pink and Sharp, mild temporal PPA   C/D Ratio 0.3 0.3   Macula Flat, Good foveal reflex, rare MA, cluster of drusen ST mac, RPE mottling Blunted foveal reflex, central cystic changes - persistent, +MA, pigmented CR scarring, mild ERM temporal macula   Vessels attenuated, Tortuous attenuated, Tortuous   Periphery Attached, mild midzonal drusen, rare MA Attached, mild midzonal drusen, mild reticular degeneration, rare MA           Refraction     Wearing Rx       Sphere Cylinder Axis Add   Right -0.50 Sphere  +2.50   Left -0.50 +0.50 090 +2.50         Manifest Refraction       Sphere Cylinder Axis Dist VA   Right -1.00 Sphere  20/25   Left -0.50 +0.50 085 20/80           IMAGING AND PROCEDURES  Imaging and Procedures for 05/05/2022  OCT, Retina - OU - Both Eyes       Right Eye Quality was good. Central Foveal Thickness: 251. Progression has been stable. Findings include normal foveal contour, no IRF, no SRF (Rare drusen).   Left Eye Quality was good. Central Foveal Thickness: 317. Progression has been stable. Findings include no SRF, abnormal foveal contour, intraretinal hyper-reflective material, intraretinal fluid (persistent IRF/IRHM temporal fovea with prominent central cyst).   Notes *Images captured and stored on drive  Diagnosis / Impression:  OD: NFP; no IRF/SRF OS: persistent IRF/IRHM temporal fovea with prominent central cyst  Clinical management:  See below  Abbreviations: NFP - Normal foveal profile. CME - cystoid macular edema. PED - pigment epithelial detachment. IRF - intraretinal fluid. SRF - subretinal fluid. EZ - ellipsoid zone. ERM - epiretinal membrane. ORA - outer retinal atrophy. ORT - outer retinal tubulation. SRHM - subretinal hyper-reflective  material. IRHM - intraretinal hyper-reflective material      Intravitreal Injection, Pharmacologic Agent - OS - Left Eye       Time Out 05/05/2022. 1:49 PM. Confirmed correct patient, procedure, site, and patient consented.   Anesthesia Topical anesthesia was used. Anesthetic medications included Lidocaine 2%, Proparacaine 0.5%.   Procedure Preparation included 5% betadine to ocular surface, eyelid speculum. A (32g) needle was used.   Injection: 2 mg aflibercept 2 MG/0.05ML   Route: Intravitreal, Site: Left Eye   NDC: [id number], Lot: [id number], Expiration date: 01/14/2023, Waste: 0 mL   Post-op Post injection exam found visual acuity of at least counting fingers. The patient tolerated the procedure well. There were no complications. The patient received written and verbal post procedure care education. Post injection medications were not given.            ASSESSMENT/PLAN:    ICD-10-CM   1. Both eyes affected by mild nonproliferative diabetic retinopathy with macular edema, associated with type 2 diabetes mellitus (HCC)  J24.2683 OCT, Retina - OU - Both Eyes    Intravitreal Injection, Pharmacologic Agent - OS - Left Eye    aflibercept (EYLEA) SOLN 2 mg    2. Intermediate stage nonexudative age-related macular degeneration of both eyes  H35.3132     3. Essential hypertension  I10     4. Hypertensive retinopathy of both eyes  H35.033     5. Pseudophakia, both eyes  Z96.1      Mild Non-proliferative diabetic retinopathy, both eyes - last A1c 6.8 on 3.17.23 from 8.2 on 10.20.22  -  IVA OS #1 (02.21.23), #2 (03.21.23), #3 (04.18.22), #4 (05.16.23), #5 (06.13.23) -- IVA resistance  - s/p IVE OS #1 (07.17.23) - OD: without DME - OS: +DME - exam shows rare MA OU - unable to obtain FA -- poor IV access - BCVA OS 20/80 - dec from 20/70 - OCT shows persistent IRF/edema OS  - recommend IVE OS #2 today, 08.14.23 - pt wishes to proceed with injection - RBA of procedure  discussed, questions answered - IVE informed consent obtained and signed for IVE OS on 07.17.23 - see procedure note - f/u in 4 wks -- DFE/OCT, possible injection  2. Age related macular degeneration, non-exudative, OU  - intermediate stage with mild midzonal drusen - The incidence, anatomy, and pathology of dry AMD, risk of progression, and the AREDS and AREDS 2 studies including smoking risks discussed with patient. - continue Amsler grid monitoring  3,4. Hypertensive retinopathy OU - discussed importance of tight BP control - monitor  5. Pseudophakia OU  - s/p CE/IOL OU  - 3-piece IOLs in good position, doing well  - monitor   Ophthalmic Meds Ordered this visit:  Meds ordered this encounter  Medications   aflibercept (EYLEA) SOLN 2 mg     Return in about 4 weeks (around 06/02/2022) for f/u NPDR OU, DFE, OCT.  There are no Patient Instructions on file for this visit.   Explained the diagnoses, plan, and follow up with the patient and they expressed understanding.  Patient expressed understanding of the importance of proper follow up care.   This document serves as a record of services personally performed by Gardiner Sleeper, MD, PhD. It was created on their behalf by Roselee Nova, COMT. The creation of this record is the provider's dictation and/or activities during the visit.  Electronically signed by: Roselee Nova, COMT 05/05/22 2:04 PM  This document serves as a record of services personally performed by Gardiner Sleeper, MD, PhD. It was created on their behalf by San Jetty. Owens Shark, OA an ophthalmic technician. The creation of this record is the provider's dictation and/or activities during the visit.    Electronically signed by: San Jetty. Owens Shark, [place] 08.14.2023 2:04 PM  Gardiner Sleeper, M.D., Ph.D. Diseases & Surgery of the Retina and Vitreous [facility]  I have reviewed the above documentation for accuracy and completeness, and I agree with the  above. Gardiner Sleeper, M.D., Ph.D. 05/05/22 2:05 PM   Abbreviations: M myopia (nearsighted); A astigmatism; H hyperopia (farsighted); P presbyopia; Mrx spectacle prescription;  CTL contact lenses; OD right eye; OS left eye; OU both eyes  XT exotropia; ET esotropia; PEK punctate epithelial keratitis; PEE punctate epithelial erosions; DES dry eye syndrome; MGD meibomian gland dysfunction; ATs artificial tears; PFAT's preservative free artificial tears; [place] nuclear sclerotic cataract; PSC posterior subcapsular cataract; ERM epi-retinal membrane; PVD posterior vitreous detachment; RD retinal detachment; DM diabetes mellitus; DR diabetic retinopathy; NPDR non-proliferative diabetic retinopathy; PDR proliferative diabetic retinopathy; CSME clinically significant macular edema; DME diabetic macular edema; dbh dot blot hemorrhages; CWS cotton wool spot; POAG primary open angle glaucoma; C/D cup-to-disc ratio; HVF humphrey visual field; GVF goldmann visual field; OCT optical coherence tomography; IOP intraocular pressure; BRVO Branch retinal vein occlusion; CRVO central retinal vein occlusion; CRAO central retinal artery occlusion; BRAO branch retinal artery occlusion; RT retinal tear; SB scleral buckle; PPV pars plana vitrectomy; VH Vitreous hemorrhage; PRP panretinal laser photocoagulation; IVK intravitreal kenalog; VMT vitreomacular traction; MH Macular hole;  NVD neovascularization of  the disc; NVE neovascularization elsewhere; AREDS age related eye disease study; ARMD age related macular degeneration; POAG primary open angle glaucoma; EBMD epithelial/anterior basement membrane dystrophy; ACIOL anterior chamber intraocular lens; IOL intraocular lens; PCIOL posterior chamber intraocular lens; Phaco/IOL phacoemulsification with intraocular lens placement; [place] photorefractive keratectomy; LASIK laser assisted in situ keratomileusis; HTN hypertension; DM diabetes mellitus; COPD chronic obstructive pulmonary disease

## 2022-05-01 ENCOUNTER — Ambulatory Visit (INDEPENDENT_AMBULATORY_CARE_PROVIDER_SITE_OTHER): Payer: Medicare Other | Admitting: Family Medicine

## 2022-05-01 VITALS — BP 140/78 | HR 67 | Temp 98.8°F | Wt 160.4 lb

## 2022-05-01 DIAGNOSIS — E1140 Type 2 diabetes mellitus with diabetic neuropathy, unspecified: Secondary | ICD-10-CM | POA: Diagnosis not present

## 2022-05-01 DIAGNOSIS — F0390 Unspecified dementia without behavioral disturbance: Secondary | ICD-10-CM | POA: Diagnosis not present

## 2022-05-01 DIAGNOSIS — Z794 Long term (current) use of insulin: Secondary | ICD-10-CM | POA: Diagnosis not present

## 2022-05-01 DIAGNOSIS — E113213 Type 2 diabetes mellitus with mild nonproliferative diabetic retinopathy with macular edema, bilateral: Secondary | ICD-10-CM | POA: Diagnosis not present

## 2022-05-01 DIAGNOSIS — I10 Essential (primary) hypertension: Secondary | ICD-10-CM | POA: Diagnosis not present

## 2022-05-01 NOTE — Progress Notes (Signed)
Subjective:    Patient ID: Karen Powell, female    DOB: 03/04/1936, 86 y.o.   MRN: [id number]  Chief Complaint  Patient presents with   Follow-up    DM ... States not going well.     HPI Patient is an 86 year old female with pmh sig for HTN, HLD, dementia, DM 2, GERD, who was seen today for f/u on DM and HTN.  Pt started Farxiga 5 mg after last OFV.  BS reading 173-333.  Pt was taking metformin and lantus.  Holding Novolog.  Pt was concerned about hyperglycemia.  Pt states she has an eye appointment coming up.  Patient states she is taking BP meds consistently.  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  Patient states she does not like her daughter in her business and that is why they argue when she comes to [employer].  Past Medical History:  Diagnosis Date   Arthritis    Blood transfusion without reported diagnosis    Depression    Diabetic neuropathy (HCC)    Dizziness    Frequent headaches    Hyperlipidemia    Hypertension    Migraines    Palpitations    Urinary incontinence     Allergies  Allergen Reactions   Penicillins Anaphylaxis    ROS General: Denies fever, chills, night sweats, changes in weight, changes in appetite HEENT: Denies headaches, ear pain, changes in vision, rhinorrhea, sore throat CV: Denies CP, palpitations, SOB, orthopnea Pulm: Denies SOB, cough, wheezing GI: Denies abdominal pain, nausea, vomiting, diarrhea, constipation GU: Denies dysuria, hematuria, frequency, vaginal discharge Msk: Denies muscle cramps, joint pains Neuro: Denies weakness, numbness, tingling Skin: Denies rashes, bruising Psych: Denies depression, anxiety, hallucinations     Objective:    Blood pressure (!) 140/78, pulse 67, temperature 98.8 F (37.1 C), temperature source Oral, weight 160 lb 6.4 oz (72.8 kg), SpO2 97 %.   Gen. Pleasant, well-nourished, in no distress, normal affect   HEENT: [place]/AT, face symmetric,  conjunctiva clear, no scleral icterus, PERRLA, EOMI, nares patent without drainage Lungs: no accessory muscle use, CTAB, no wheezes or rales Cardiovascular: RRR, no m/r/g, no peripheral edema Musculoskeletal: No deformities, no cyanosis or clubbing, normal tone Neuro:  A&Ox3, CN II-XII intact, normal gait Skin:  Warm, no lesions/ rash   Wt Readings from Last 3 Encounters:  05/01/22 160 lb 6.4 oz (72.8 kg)  03/31/22 170 lb 3.2 oz (77.2 kg)  03/28/22 169 lb 12.8 oz (77 kg)    Lab Results  Component Value Date   WBC 5.0 02/20/2021   HGB 12.9 02/20/2021   HCT 39.1 02/20/2021   PLT 202.0 02/20/2021   GLUCOSE 433 (H) 02/20/2021   CHOL 154 02/20/2021   TRIG 126.0 02/20/2021   HDL 47.00 02/20/2021   LDLCALC 82 02/20/2021   ALT 16 02/20/2021   AST 20 02/20/2021   NA 141 02/20/2021   K 4.3 02/20/2021   CL 103 02/20/2021   CREATININE 0.99 02/20/2021   BUN 26 (H) 02/20/2021   CO2 19 02/20/2021   TSH 1.03 02/20/2021   HGBA1C 6.8 (A) 12/06/2021    Assessment/Plan:  Type 2 diabetes mellitus with diabetic neuropathy, with long-term current use of insulin (HCC) -Controlled -Hemoglobin A1c 6.8% on 12/06/2021 -Restart NovoLog 30 units with meals -Continue Farxiga 5 mg daily, metformin 500 mg twice daily, Lantus 30 units nightly -Continue to monitor blood sugar -Continue ARB and statin -Foot exam due at next [facility]  Dementia without behavioral disturbance (HCC) -Discussed medication options and formal testing.  Pt declines. -Continue to monitor with close follow-up  Essential hypertension -Controlled -Continue current medications including losartan-hydrochlorothiazide 100-25 mg daily, Toprol-XL 50 mg in a.m. and 25 mg in p.m.  Both eyes affected by mild nonproliferative diabetic retinopathy with macular edema, associated with type 2 diabetes mellitus (HCC) -Importance of glycemic control discussed -Continue follow-up with ophthalmology  F/u in 2-3 months  Abbe Amsterdam, MD

## 2022-05-05 ENCOUNTER — Encounter (INDEPENDENT_AMBULATORY_CARE_PROVIDER_SITE_OTHER): Payer: Self-pay | Admitting: Ophthalmology

## 2022-05-05 ENCOUNTER — Ambulatory Visit (INDEPENDENT_AMBULATORY_CARE_PROVIDER_SITE_OTHER): Payer: Medicare Other | Admitting: Ophthalmology

## 2022-05-05 DIAGNOSIS — H35033 Hypertensive retinopathy, bilateral: Secondary | ICD-10-CM | POA: Diagnosis not present

## 2022-05-05 DIAGNOSIS — H353132 Nonexudative age-related macular degeneration, bilateral, intermediate dry stage: Secondary | ICD-10-CM

## 2022-05-05 DIAGNOSIS — I10 Essential (primary) hypertension: Secondary | ICD-10-CM | POA: Diagnosis not present

## 2022-05-05 DIAGNOSIS — E113213 Type 2 diabetes mellitus with mild nonproliferative diabetic retinopathy with macular edema, bilateral: Secondary | ICD-10-CM

## 2022-05-05 DIAGNOSIS — Z961 Presence of intraocular lens: Secondary | ICD-10-CM

## 2022-05-05 MED ORDER — AFLIBERCEPT 2MG/0.05ML IZ SOLN FOR KALEIDOSCOPE
2.0000 mg | INTRAVITREAL | Status: AC | PRN
  Administered 2022-05-05: 2 mg via INTRAVITREAL

## 2022-05-10 ENCOUNTER — Other Ambulatory Visit: Payer: Self-pay | Admitting: Interventional Cardiology

## 2022-05-14 ENCOUNTER — Telehealth: Payer: Self-pay | Admitting: Interventional Cardiology

## 2022-05-14 NOTE — Telephone Encounter (Signed)
Pt c/o medication issue:  1. Name of Medication: TOPROL XL 25 MG 24 hr tablet  2. How are you currently taking this medication (dosage and times per day)? Needs to verify how she takes it  3. Are you having a reaction (difficulty breathing--STAT)? no  4. What is your medication issue? Patient's granddaughter, Loanne Emery, states the 50 mg tablets hurt the patient's stomach. She would like to find out exactly how the patient is supposed to take the medication. Phone: [phone]

## 2022-05-14 NOTE — Telephone Encounter (Signed)
Returned call to [facility] and verified patient is to take Toprol XL 50mg  (2 of 25mg  tablets) in the AM and 1 50mg  tablet in the PM.  Yvette verbalized understanding and states she was told by patient that Dr. said she can only take name brand of medication and not generic.   states name brand of medication is getting "pricey" and asked if she could be switched to generic form of Toprol XL. that Dr. Katrinka Blazing is currently out of the office until next week but I will discuss this with him and call her back with his response.  Yvette verbalized understanding and expressed appreciation for call.

## 2022-05-20 ENCOUNTER — Encounter: Payer: Self-pay | Admitting: Family Medicine

## 2022-05-20 MED ORDER — LANTUS SOLOSTAR 100 UNIT/ML ~~LOC~~ SOPN: 30.0000 [IU] | PEN_INJECTOR | Freq: Every day | SUBCUTANEOUS | 4 refills | Status: DC

## 2022-05-20 MED ORDER — NOVOLOG FLEXPEN 100 UNIT/ML ~~LOC~~ SOPN: 20.0000 [IU] | PEN_INJECTOR | Freq: Three times a day (TID) | SUBCUTANEOUS | 11 refills | Status: DC

## 2022-05-21 ENCOUNTER — Other Ambulatory Visit: Payer: Self-pay | Admitting: Family Medicine

## 2022-05-21 DIAGNOSIS — E1140 Type 2 diabetes mellitus with diabetic neuropathy, unspecified: Secondary | ICD-10-CM

## 2022-05-29 NOTE — Progress Notes (Signed)
[facility] Note  06/02/2022     CHIEF COMPLAINT Patient presents for Retina Follow Up   HISTORY OF PRESENT ILLNESS: Karen Powell is a 86 y.o. female who presents to the clinic today for:   HPI     Retina Follow Up   Patient presents with  Diabetic Retinopathy.  In both eyes.  This started months ago.  Severity is moderate.  Duration of 4 weeks.  Since onset it is stable.  I, the attending physician,  performed the HPI with the patient and updated documentation appropriately.        Comments   Patient feels that the vision is the same since her last visit. She has not checked her sugars and she is unsure of her A1C.      Last edited by Bernarda Caffey, MD on 06/02/2022  1:22 PM.    Pt states vision seems the same  Referring physician: Billie Ruddy, MD [place],  [place] [address]  HISTORICAL INFORMATION:   Selected notes from the MEDICAL RECORD NUMBER Referred by Dr. Lucianne Lei for mac edema LEE: 07/04/21 BCVA OD: 20/40 OS: 20/100 Ocular Hx- amblyopia, macular edema OS PMH- DM, HTN, migraines, high cholesterol; last a1c was 8.2 on 07/11/21    CURRENT MEDICATIONS: Current Outpatient Medications (Ophthalmic Drugs)  Medication Sig   Polyethyl Glycol-Propyl Glycol (SYSTANE ULTRA OP) Apply 1 drop to eye daily.   No current facility-administered medications for this visit. (Ophthalmic Drugs)   Current Outpatient Medications (Other)  Medication Sig   Alcohol Swabs ([employer] PREP) 70 % PADS USE FOR CHECKING BLOOD SUGAR AT LEAST 3 TIMES DAILY   Apoaequorin (PREVAGEN PO) Take 1 capsule by mouth daily.   aspirin EC 81 MG tablet Take 1 tablet (81 mg total) by mouth daily.   BD PEN NEEDLE MICRO U/F 32G X 6 MM MISC USE TO ADMINISTER INSULIN THREE TIMES DAILY   Cetirizine HCl (ZYRTEC ALLERGY PO) Take 1 tablet by mouth daily.   Coenzyme Q10 (COQ10) 200 MG CAPS Take 1 capsule by mouth daily.   Continuous Blood Gluc Sensor (FREESTYLE LIBRE 2  SENSOR) MISC Use to check blood sugars.   dapagliflozin propanediol (FARXIGA) 5 MG TABS tablet Take 1 tablet (5 mg total) by mouth daily before breakfast.   esomeprazole (NEXIUM) 20 MG capsule Take 20 mg by mouth daily at 12 noon.   insulin aspart (NOVOLOG FLEXPEN) 100 UNIT/ML FlexPen Inject 20 Units into the skin 3 (three) times daily with meals.   insulin glargine (LANTUS SOLOSTAR) 100 UNIT/ML Solostar Pen Inject 30 Units into the skin at bedtime.   losartan-hydrochlorothiazide (HYZAAR) 100-25 MG tablet Take 1 tablet by mouth daily.   metFORMIN (GLUCOPHAGE) 500 MG tablet TAKE 1 TABLET BY MOUTH TWICE A DAY   Multiple Vitamins-Minerals (CENTRUM PO) Take 1 tablet by mouth daily.   Naproxen Sodium (ALEVE PO) Take 2 tablets by mouth daily as needed (pain).    Omega-3 Fatty Acids (OMEGA 3 PO) Take 1 capsule by mouth daily.   Probiotic Product (PROBIOTIC PO) Take 1 capsule by mouth at bedtime.   rosuvastatin (CRESTOR) 20 MG tablet TAKE 1 TABLET BY MOUTH EVERYDAY AT BEDTIME   Sennosides-Docusate Sodium (SENOKOT S PO) Take 1 tablet by mouth 2 (two) times daily.   TOPROL XL 25 MG 24 hr tablet TAKE 2 TABLETS (50 MG) IN THE MORNING AND 1 TABLET (25 MG) IN THE EVENING   Turmeric 500 MG TABS Take 1 tablet by  mouth daily.   ZOLOFT 25 MG tablet Take 1 tablet (25 mg total) by mouth daily.   No current facility-administered medications for this visit. (Other)   REVIEW OF SYSTEMS: ROS   Positive for: Endocrine, Cardiovascular, Eyes Negative for: Constitutional, Gastrointestinal, Neurological, Skin, Genitourinary, Musculoskeletal, HENT, Respiratory, Psychiatric, Allergic/Imm, Heme/Lymph Last edited by Annie Paras, COT on 06/02/2022 12:43 PM.     ALLERGIES Allergies  Allergen Reactions   Penicillins Anaphylaxis   PAST MEDICAL HISTORY Past Medical History:  Diagnosis Date   Arthritis    Blood transfusion without reported diagnosis    Depression    Diabetic neuropathy (HCC)    Dizziness     Frequent headaches    Hyperlipidemia    Hypertension    Migraines    Palpitations    Urinary incontinence    Past Surgical History:  Procedure Laterality Date   none     FAMILY HISTORY Family History  Problem Relation Age of Onset   Diabetes Mother    Hypertension Mother    Diabetes Maternal Grandmother    Diabetes Maternal Grandfather    SOCIAL HISTORY Social History   Tobacco Use   Smoking status: Never   Smokeless tobacco: Never  Vaping Use   Vaping Use: Never used  Substance Use Topics   Alcohol use: Yes   Drug use: No       OPHTHALMIC EXAM:  Base Eye Exam     Visual Acuity (Snellen - Linear)       Right Left   Dist [place] 20/40 +2 20/100   Dist ph [place] 20/30 20/80         Tonometry (Tonopen, 12:47 PM)       Right Left   Pressure 12 12         Pupils       Dark Light Shape React APD   Right 3 2 Round Minimal None   Left 3 2 Round Minimal None         Visual Fields       Left Right    Full Full         Extraocular Movement       Right Left    Full, Ortho Full, Ortho         Neuro/Psych     Oriented x3: Yes   Mood/Affect: Normal         Dilation     Both eyes: 1.0% Mydriacyl, 2.5% Phenylephrine @ 12:43 PM           Slit Lamp and Fundus Exam     Slit Lamp Exam       Right Left   Lids/Lashes Dermatochalasis - upper lid Dermatochalasis - upper lid   Conjunctiva/Sclera nasal and temporal pinguecula, mild melanosis nasal and temporal pinguecula, mild melanosis   Cornea 2+ Punctate epithelial erosions, mild arcus 2+ Punctate epithelial erosions, mild arcus   Anterior Chamber Deep and quiet Deep and quiet   Iris Round and dilated Round and dilated   Lens 3 piece PC IOL, open PC 3 piece PC IOL, open PC   Anterior Vitreous Vitreous syneresis, Posterior vitreous detachment, mild Asteroid hyalosis inferiorly Vitreous syneresis, Posterior vitreous detachment         Fundus Exam       Right Left   Disc Pink and Sharp,  mild PPA/PPP Pink and Sharp, mild temporal PPA   C/D Ratio 0.3 0.3   Macula Flat, Good foveal reflex, rare MA, cluster of drusen  ST mac, RPE mottling Blunted foveal reflex, cluster of drusen ST mac, central cystic changes - persistent, +MA, pigmented CR scarring, mild ERM temporal macula   Vessels attenuated, Tortuous attenuated, Tortuous   Periphery Attached, mild midzonal drusen, rare MA Attached, mild midzonal drusen, mild reticular degeneration, rare MA           Refraction     Wearing Rx       Sphere Cylinder Axis Add   Right -0.50 Sphere  +2.50   Left -0.50 +0.50 090 +2.50           IMAGING AND PROCEDURES  Imaging and Procedures for 06/02/2022  OCT, Retina - OU - Both Eyes       Right Eye Quality was good. Central Foveal Thickness: 249. Progression has been stable. Findings include normal foveal contour, no IRF, no SRF (Rare drusen).   Left Eye Quality was good. Central Foveal Thickness: 281. Progression has improved. Findings include no SRF, abnormal foveal contour, intraretinal hyper-reflective material, intraretinal fluid (Mild interval improvement in IRF/central cyst).   Notes *Images captured and stored on drive  Diagnosis / Impression:  OD: NFP; no IRF/SRF OS: Mild interval improvement in IRF/central cyst  Clinical management:  See below  Abbreviations: NFP - Normal foveal profile. CME - cystoid macular edema. PED - pigment epithelial detachment. IRF - intraretinal fluid. SRF - subretinal fluid. EZ - ellipsoid zone. ERM - epiretinal membrane. ORA - outer retinal atrophy. ORT - outer retinal tubulation. SRHM - subretinal hyper-reflective material. IRHM - intraretinal hyper-reflective material      Intravitreal Injection, Pharmacologic Agent - OS - Left Eye       Time Out 06/02/2022. 1:12 PM. Confirmed correct patient, procedure, site, and patient consented.   Anesthesia Topical anesthesia was used. Anesthetic medications included Lidocaine 2%,  Proparacaine 0.5%.   Procedure Preparation included 5% betadine to ocular surface, eyelid speculum. A (32g) needle was used.   Injection: 2 mg aflibercept 2 MG/0.05ML   Route: Intravitreal, Site: Left Eye   NDC: [id number], Lot: [id number], Expiration date: 06/22/2023, Waste: 0 mL   Post-op Post injection exam found visual acuity of at least counting fingers. The patient tolerated the procedure well. There were no complications. The patient received written and verbal post procedure care education. Post injection medications were not given.            ASSESSMENT/PLAN:    ICD-10-CM   1. Both eyes affected by mild nonproliferative diabetic retinopathy with macular edema, associated with type 2 diabetes mellitus (HCC)  H41.7408 OCT, Retina - OU - Both Eyes    Intravitreal Injection, Pharmacologic Agent - OS - Left Eye    aflibercept (EYLEA) SOLN 2 mg    2. Intermediate stage nonexudative age-related macular degeneration of both eyes  H35.3132     3. Essential hypertension  I10     4. Hypertensive retinopathy of both eyes  H35.033     5. Pseudophakia, both eyes  Z96.1       Mild Non-proliferative diabetic retinopathy, both eyes - last A1c 6.8 on 3.17.23 from 8.2 on 10.20.22  - IVA OS #1 (02.21.23), #2 (03.21.23), #3 (04.18.22), #4 (05.16.23), #5 (06.13.23) -- IVA resistance  - s/p IVE OS #1 (07.17.23), #2 (08.14.23) - OD: without DME - OS: +DME - exam shows rare MA OU - unable to obtain FA -- poor IV access - BCVA OS 20/80 - stable - OCT shows persistent IRF/edema OS -- slightly improved - recommend IVE OS #3 today, 09.11.23 -  pt wishes to proceed with injection - RBA of procedure discussed, questions answered - IVE informed consent obtained and signed for IVE OS on 07.17.23 - see procedure note - f/u in 4 wks -- DFE/OCT, possible injection  2. Age related macular degeneration, non-exudative, OU  - intermediate stage with mild midzonal drusen - The incidence,  anatomy, and pathology of dry AMD, risk of progression, and the AREDS and AREDS 2 studies including smoking risks discussed with patient. - continue Amsler grid monitoring  3,4. Hypertensive retinopathy OU - discussed importance of tight BP control - monitor  5. Pseudophakia OU  - s/p CE/IOL OU  - 3-piece IOLs in good position, doing well  - monitor   Ophthalmic Meds Ordered this visit:  Meds ordered this encounter  Medications   aflibercept (EYLEA) SOLN 2 mg     Return in about 4 weeks (around 06/30/2022) for f/u NPDR OU, DFE, OCT.  There are no Patient Instructions on file for this visit.   Explained the diagnoses, plan, and follow up with the patient and they expressed understanding.  Patient expressed understanding of the importance of proper follow up care.   This document serves as a record of services personally performed by Gardiner Sleeper, MD, PhD. It was created on their behalf by Roselee Nova, COMT. The creation of this record is the provider's dictation and/or activities during the visit.  Electronically signed by: Roselee Nova, COMT 06/02/22 1:23 PM  This document serves as a record of services personally performed by Gardiner Sleeper, MD, PhD. It was created on their behalf by San Jetty. Owens Shark, OA an ophthalmic technician. The creation of this record is the provider's dictation and/or activities during the visit.    Electronically signed by: San Jetty. Owens Shark, [place] 09.11.2023 1:23 PM   Gardiner Sleeper, M.D., Ph.D. Diseases & Surgery of the Retina and Vitreous [facility]  I have reviewed the above documentation for accuracy and completeness, and I agree with the above. Gardiner Sleeper, M.D., Ph.D. 06/02/22 1:23 PM  Abbreviations: M myopia (nearsighted); A astigmatism; H hyperopia (farsighted); P presbyopia; Mrx spectacle prescription;  CTL contact lenses; OD right eye; OS left eye; OU both eyes  XT exotropia; ET esotropia; PEK punctate  epithelial keratitis; PEE punctate epithelial erosions; DES dry eye syndrome; MGD meibomian gland dysfunction; ATs artificial tears; PFAT's preservative free artificial tears; [place] nuclear sclerotic cataract; PSC posterior subcapsular cataract; ERM epi-retinal membrane; PVD posterior vitreous detachment; RD retinal detachment; DM diabetes mellitus; DR diabetic retinopathy; NPDR non-proliferative diabetic retinopathy; PDR proliferative diabetic retinopathy; CSME clinically significant macular edema; DME diabetic macular edema; dbh dot blot hemorrhages; CWS cotton wool spot; POAG primary open angle glaucoma; C/D cup-to-disc ratio; HVF humphrey visual field; GVF goldmann visual field; OCT optical coherence tomography; IOP intraocular pressure; BRVO Branch retinal vein occlusion; CRVO central retinal vein occlusion; CRAO central retinal artery occlusion; BRAO branch retinal artery occlusion; RT retinal tear; SB scleral buckle; PPV pars plana vitrectomy; VH Vitreous hemorrhage; PRP panretinal laser photocoagulation; IVK intravitreal kenalog; VMT vitreomacular traction; MH Macular hole;  NVD neovascularization of the disc; NVE neovascularization elsewhere; AREDS age related eye disease study; ARMD age related macular degeneration; POAG primary open angle glaucoma; EBMD epithelial/anterior basement membrane dystrophy; ACIOL anterior chamber intraocular lens; IOL intraocular lens; PCIOL posterior chamber intraocular lens; Phaco/IOL phacoemulsification with intraocular lens placement; [place] photorefractive keratectomy; LASIK laser assisted in situ keratomileusis; HTN hypertension; DM diabetes mellitus; COPD chronic obstructive pulmonary disease

## 2022-06-02 ENCOUNTER — Ambulatory Visit (INDEPENDENT_AMBULATORY_CARE_PROVIDER_SITE_OTHER): Payer: Medicare Other | Admitting: Ophthalmology

## 2022-06-02 ENCOUNTER — Encounter (INDEPENDENT_AMBULATORY_CARE_PROVIDER_SITE_OTHER): Payer: Self-pay | Admitting: Ophthalmology

## 2022-06-02 DIAGNOSIS — E113213 Type 2 diabetes mellitus with mild nonproliferative diabetic retinopathy with macular edema, bilateral: Secondary | ICD-10-CM | POA: Diagnosis not present

## 2022-06-02 DIAGNOSIS — Z961 Presence of intraocular lens: Secondary | ICD-10-CM | POA: Diagnosis not present

## 2022-06-02 DIAGNOSIS — H35033 Hypertensive retinopathy, bilateral: Secondary | ICD-10-CM

## 2022-06-02 DIAGNOSIS — H353132 Nonexudative age-related macular degeneration, bilateral, intermediate dry stage: Secondary | ICD-10-CM | POA: Diagnosis not present

## 2022-06-02 DIAGNOSIS — I10 Essential (primary) hypertension: Secondary | ICD-10-CM

## 2022-06-02 MED ORDER — AFLIBERCEPT 2MG/0.05ML IZ SOLN FOR KALEIDOSCOPE
2.0000 mg | INTRAVITREAL | Status: AC | PRN
  Administered 2022-06-02: 2 mg via INTRAVITREAL

## 2022-06-17 NOTE — Progress Notes (Signed)
[facility] Note  07/01/2022     CHIEF COMPLAINT Patient presents for Retina Follow Up   HISTORY OF PRESENT ILLNESS: Karen Powell is a 86 y.o. female who presents to the clinic today for:   HPI     Retina Follow Up   Patient presents with  Diabetic Retinopathy.  In both eyes.  Severity is moderate.  Duration of 4 weeks.  Since onset it is stable.  I, the attending physician,  performed the HPI with the patient and updated documentation appropriately.        Comments   Patient states vision the same OU. BS was 239 this am, which is a lot higher than normal.       Last edited by Bernarda Caffey, MD on 07/01/2022  4:12 PM.    Pt states vision has improved  Referring physician: Billie Ruddy, MD [place],  [place] [address]  HISTORICAL INFORMATION:   Selected notes from the MEDICAL RECORD NUMBER Referred by Dr. Lucianne Lei for mac edema LEE: 07/04/21 BCVA OD: 20/40 OS: 20/100 Ocular Hx- amblyopia, macular edema OS PMH- DM, HTN, migraines, high cholesterol; last a1c was 8.2 on 07/11/21    CURRENT MEDICATIONS: Current Outpatient Medications (Ophthalmic Drugs)  Medication Sig   Polyethyl Glycol-Propyl Glycol (SYSTANE ULTRA OP) Apply 1 drop to eye daily.   No current facility-administered medications for this visit. (Ophthalmic Drugs)   Current Outpatient Medications (Other)  Medication Sig   Alcohol Swabs ([employer] PREP) 70 % PADS USE FOR CHECKING BLOOD SUGAR AT LEAST 3 TIMES DAILY   Apoaequorin (PREVAGEN PO) Take 1 capsule by mouth daily.   aspirin EC 81 MG tablet Take 1 tablet (81 mg total) by mouth daily.   BD PEN NEEDLE MICRO U/F 32G X 6 MM MISC USE TO ADMINISTER INSULIN THREE TIMES DAILY   Cetirizine HCl (ZYRTEC ALLERGY PO) Take 1 tablet by mouth daily.   Coenzyme Q10 (COQ10) 200 MG CAPS Take 1 capsule by mouth daily.   Continuous Blood Gluc Sensor (FREESTYLE LIBRE 2 SENSOR) MISC Use to check blood sugars.   dapagliflozin  propanediol (FARXIGA) 5 MG TABS tablet Take 1 tablet (5 mg total) by mouth daily before breakfast.   esomeprazole (NEXIUM) 20 MG capsule Take 20 mg by mouth daily at 12 noon.   insulin aspart (NOVOLOG FLEXPEN) 100 UNIT/ML FlexPen Inject 20 Units into the skin 3 (three) times daily with meals.   insulin glargine (LANTUS SOLOSTAR) 100 UNIT/ML Solostar Pen Inject 30 Units into the skin at bedtime.   losartan-hydrochlorothiazide (HYZAAR) 100-25 MG tablet Take 1 tablet by mouth daily.   metFORMIN (GLUCOPHAGE) 500 MG tablet TAKE 1 TABLET BY MOUTH TWICE A DAY   Multiple Vitamins-Minerals (CENTRUM PO) Take 1 tablet by mouth daily.   Naproxen Sodium (ALEVE PO) Take 2 tablets by mouth daily as needed (pain).    Omega-3 Fatty Acids (OMEGA 3 PO) Take 1 capsule by mouth daily.   Probiotic Product (PROBIOTIC PO) Take 1 capsule by mouth at bedtime.   rosuvastatin (CRESTOR) 20 MG tablet TAKE 1 TABLET BY MOUTH EVERYDAY AT BEDTIME   Sennosides-Docusate Sodium (SENOKOT S PO) Take 1 tablet by mouth 2 (two) times daily.   TOPROL XL 25 MG 24 hr tablet TAKE 2 TABLETS (50 MG) IN THE MORNING AND 1 TABLET (25 MG) IN THE EVENING   Turmeric 500 MG TABS Take 1 tablet by mouth daily.   ZOLOFT 25 MG tablet Take 1 tablet (25  mg total) by mouth daily.   No current facility-administered medications for this visit. (Other)   REVIEW OF SYSTEMS: ROS   Positive for: Endocrine, Cardiovascular, Eyes Negative for: Constitutional, Gastrointestinal, Neurological, Skin, Genitourinary, Musculoskeletal, HENT, Respiratory, Psychiatric, Allergic/Imm, Heme/Lymph Last edited by Jobe Marker, COT on 07/01/2022 12:58 PM.     ALLERGIES Allergies  Allergen Reactions   Penicillins Anaphylaxis   PAST MEDICAL HISTORY Past Medical History:  Diagnosis Date   Arthritis    Blood transfusion without reported diagnosis    Depression    Diabetic neuropathy ([place])    Dizziness    Frequent headaches    Hyperlipidemia    Hypertension     Migraines    Palpitations    Urinary incontinence    Past Surgical History:  Procedure Laterality Date   none     FAMILY HISTORY Family History  Problem Relation Age of Onset   Diabetes Mother    Hypertension Mother    Diabetes Maternal Grandmother    Diabetes Maternal Grandfather    SOCIAL HISTORY Social History   Tobacco Use   Smoking status: Never   Smokeless tobacco: Never  Vaping Use   Vaping Use: Never used  Substance Use Topics   Alcohol use: Yes   Drug use: No       OPHTHALMIC EXAM:  Base Eye Exam     Visual Acuity (Snellen - Linear)       Right Left   Dist [place] 20/25 -2 20/80 -1   Dist ph [place] 20/25 20/70 -1         Tonometry (Tonopen, 1:07 PM)       Right Left   Pressure 13 12         Pupils       Dark Light Shape React APD   Right 3 2 Round Minimal None   Left 3 2 Round Minimal None         Visual Fields (Counting fingers)       Left Right    Full Full         Extraocular Movement       Right Left    Full, Ortho Full, Ortho         Neuro/Psych     Oriented x3: Yes   Mood/Affect: Normal         Dilation     Both eyes: 1.0% Mydriacyl, 2.5% Phenylephrine @ 1:07 PM           Slit Lamp and Fundus Exam     Slit Lamp Exam       Right Left   Lids/Lashes Dermatochalasis - upper lid Dermatochalasis - upper lid   Conjunctiva/Sclera nasal and temporal pinguecula, mild melanosis nasal and temporal pinguecula, mild melanosis   Cornea 2+ Punctate epithelial erosions, mild arcus 2+ Punctate epithelial erosions, mild arcus   Anterior Chamber Deep and quiet Deep and quiet   Iris Round and dilated Round and dilated   Lens 3 piece PC IOL, open PC 3 piece PC IOL, open PC   Anterior Vitreous Vitreous syneresis, Posterior vitreous detachment, mild Asteroid hyalosis inferiorly Vitreous syneresis, Posterior vitreous detachment         Fundus Exam       Right Left   Disc Pink and Sharp, mild PPA/PPP Pink and Sharp, mild  temporal PPA   C/D Ratio 0.3 0.3   Macula Flat, Good foveal reflex, rare MA, cluster of drusen ST mac, RPE mottling Blunted foveal reflex, cluster  of drusen ST mac, central cystic changes - slightly improved, +MA, pigmented CR scarring, mild ERM temporal macula   Vessels attenuated, mild tortuosity attenuated, Tortuous   Periphery Attached, mild midzonal drusen, rare MA Attached, mild midzonal drusen, mild reticular degeneration, rare MA           IMAGING AND PROCEDURES  Imaging and Procedures for 07/01/2022  OCT, Retina - OU - Both Eyes       Right Eye Quality was good. Central Foveal Thickness: 260. Progression has been stable. Findings include normal foveal contour, no IRF, no SRF (Rare drusen).   Left Eye Quality was good. Central Foveal Thickness: 271. Progression has improved. Findings include no SRF, abnormal foveal contour, intraretinal hyper-reflective material, intraretinal fluid (Mild interval improvement in IRF/central cyst).   Notes *Images captured and stored on drive  Diagnosis / Impression:  OD: NFP; no IRF/SRF OS: Mild interval improvement in IRF/central cyst  Clinical management:  See below  Abbreviations: NFP - Normal foveal profile. CME - cystoid macular edema. PED - pigment epithelial detachment. IRF - intraretinal fluid. SRF - subretinal fluid. EZ - ellipsoid zone. ERM - epiretinal membrane. ORA - outer retinal atrophy. ORT - outer retinal tubulation. SRHM - subretinal hyper-reflective material. IRHM - intraretinal hyper-reflective material      Intravitreal Injection, Pharmacologic Agent - OS - Left Eye       Time Out 07/01/2022. 1:36 PM. Confirmed correct patient, procedure, site, and patient consented.   Anesthesia Topical anesthesia was used. Anesthetic medications included Lidocaine 2%, Proparacaine 0.5%.   Procedure Preparation included 5% betadine to ocular surface, eyelid speculum. A (32g) needle was used.   Injection: 2 mg aflibercept  2 MG/0.05ML   Route: Intravitreal, Site: Left Eye   NDC: [id number], Lot: [id number], Expiration date: 07/23/2023, Waste: 0 mL   Post-op Post injection exam found visual acuity of at least counting fingers. The patient tolerated the procedure well. There were no complications. The patient received written and verbal post procedure care education. Post injection medications were not given.             ASSESSMENT/PLAN:    ICD-10-CM   1. Both eyes affected by mild nonproliferative diabetic retinopathy with macular edema, associated with type 2 diabetes mellitus (HCC)  U98.1191 OCT, Retina - OU - Both Eyes    Intravitreal Injection, Pharmacologic Agent - OS - Left Eye    aflibercept (EYLEA) SOLN 2 mg    2. Intermediate stage nonexudative age-related macular degeneration of both eyes  H35.3132     3. Essential hypertension  I10     4. Hypertensive retinopathy of both eyes  H35.033     5. Pseudophakia, both eyes  Z96.1        Mild Non-proliferative diabetic retinopathy, both eyes - last A1c 6.8 on 3.17.23 from 8.2 on 10.20.22  - IVA OS #1 (02.21.23), #2 (03.21.23), #3 (04.18.22), #4 (05.16.23), #5 (06.13.23) -- IVA resistance  - s/p IVE OS #1 (07.17.23), #2 (08.14.23), #3 (09.11.23) - OD: without DME - OS: +DME - exam shows rare MA OU - unable to obtain FA -- poor IV access - BCVA OS 20/70 - improved - OCT shows interval improvement in IRF/edema OS - recommend IVE OS #4 today, 10.10.23 - pt wishes to proceed with injection - RBA of procedure discussed, questions answered - IVE informed consent obtained and signed for IVE OS on 07.17.23 - see procedure note - f/u in 4 wks -- DFE/OCT, possible injection  2. Age related macular  degeneration, non-exudative, OU  - intermediate stage with mild midzonal drusen - The incidence, anatomy, and pathology of dry AMD, risk of progression, and the AREDS and AREDS 2 studies including smoking risks discussed with patient. - continue  [place] grid monitoring  3,4. Hypertensive retinopathy OU - discussed importance of tight BP control - monitor  5. Pseudophakia OU  - s/p CE/IOL OU  - 3-piece IOLs in good position, doing well  - monitor   Ophthalmic Meds Ordered this visit:  Meds ordered this encounter  Medications   aflibercept (EYLEA) SOLN 2 mg     Return in about 4 weeks (around 07/29/2022) for f/u NPDR OU, DFE, OCT.  There are no Patient Instructions on file for this visit.   Explained the diagnoses, plan, and follow up with the patient and they expressed understanding.  Patient expressed understanding of the importance of proper follow up care.   This document serves as a record of services personally performed by Gardiner Sleeper, MD, PhD. It was created on their behalf by Renaldo Reel, [place] an ophthalmic technician. The creation of this record is the provider's dictation and/or activities during the visit.    Electronically signed by:  Renaldo Reel, COT  09.26.23 4:14 PM  This document serves as a record of services personally performed by Gardiner Sleeper, MD, PhD. It was created on their behalf by San Jetty. Owens Shark, OA an ophthalmic technician. The creation of this record is the provider's dictation and/or activities during the visit.    Electronically signed by: San Jetty. [place], [place] 10.10.2023 4:14 PM   Gardiner Sleeper, M.D., Ph.D. Diseases & Surgery of the Retina and Vitreous [facility]: M myopia (nearsighted); A astigmatism; H hyperopia (farsighted); P presbyopia; Mrx spectacle prescription;  CTL contact lenses; OD right eye; OS left eye; OU both eyes  XT exotropia; ET esotropia; PEK punctate epithelial keratitis; PEE punctate epithelial erosions; DES dry eye syndrome; MGD meibomian gland dysfunction; ATs artificial tears; PFAT's preservative free artificial tears; [place] nuclear sclerotic cataract; PSC posterior subcapsular cataract; ERM epi-retinal  membrane; PVD posterior vitreous detachment; RD retinal detachment; DM diabetes mellitus; DR diabetic retinopathy; NPDR non-proliferative diabetic retinopathy; PDR proliferative diabetic retinopathy; CSME clinically significant macular edema; DME diabetic macular edema; dbh dot blot hemorrhages; CWS cotton wool spot; POAG primary open angle glaucoma; C/D cup-to-disc ratio; HVF humphrey visual field; GVF goldmann visual field; OCT optical coherence tomography; IOP intraocular pressure; BRVO Branch retinal vein occlusion; CRVO central retinal vein occlusion; CRAO central retinal artery occlusion; BRAO branch retinal artery occlusion; RT retinal tear; SB scleral buckle; PPV pars plana vitrectomy; VH Vitreous hemorrhage; PRP panretinal laser photocoagulation; IVK intravitreal kenalog; VMT vitreomacular traction; MH Macular hole;  NVD neovascularization of the disc; NVE neovascularization elsewhere; AREDS age related eye disease study; ARMD age related macular degeneration; POAG primary open angle glaucoma; EBMD epithelial/anterior basement membrane dystrophy; ACIOL anterior chamber intraocular lens; IOL intraocular lens; PCIOL posterior chamber intraocular lens; Phaco/IOL phacoemulsification with intraocular lens placement; [place] photorefractive keratectomy; LASIK laser assisted in situ keratomileusis; HTN hypertension; DM diabetes mellitus; COPD chronic obstructive pulmonary disease

## 2022-07-01 ENCOUNTER — Encounter (INDEPENDENT_AMBULATORY_CARE_PROVIDER_SITE_OTHER): Payer: Self-pay | Admitting: Ophthalmology

## 2022-07-01 ENCOUNTER — Ambulatory Visit (INDEPENDENT_AMBULATORY_CARE_PROVIDER_SITE_OTHER): Payer: Medicare Other | Admitting: Ophthalmology

## 2022-07-01 DIAGNOSIS — H35033 Hypertensive retinopathy, bilateral: Secondary | ICD-10-CM

## 2022-07-01 DIAGNOSIS — Z961 Presence of intraocular lens: Secondary | ICD-10-CM | POA: Diagnosis not present

## 2022-07-01 DIAGNOSIS — H353132 Nonexudative age-related macular degeneration, bilateral, intermediate dry stage: Secondary | ICD-10-CM | POA: Diagnosis not present

## 2022-07-01 DIAGNOSIS — E113213 Type 2 diabetes mellitus with mild nonproliferative diabetic retinopathy with macular edema, bilateral: Secondary | ICD-10-CM

## 2022-07-01 DIAGNOSIS — I10 Essential (primary) hypertension: Secondary | ICD-10-CM

## 2022-07-01 MED ORDER — AFLIBERCEPT 2MG/0.05ML IZ SOLN FOR KALEIDOSCOPE
2.0000 mg | INTRAVITREAL | Status: AC | PRN
  Administered 2022-07-01: 2 mg via INTRAVITREAL

## 2022-07-15 NOTE — Progress Notes (Signed)
[facility] Note  07/29/2022     CHIEF COMPLAINT Patient presents for Retina Follow Up   HISTORY OF PRESENT ILLNESS: Karen Powell is a 86 y.o. female who presents to the clinic today for:   HPI     Retina Follow Up   Patient presents with  Diabetic Retinopathy.  In both eyes.  This started 4 weeks ago.  I, the attending physician,  performed the HPI with the patient and updated documentation appropriately.        Comments   Patient here for 4 weeks retina follow up for NPDR OU. Patient states vision no change. No eye pain. Blurred yes.       Last edited by Bernarda Caffey, MD on 07/30/2022  9:52 PM.     Pt states vision seems stable  Referring physician: Billie Ruddy, MD [place],  [place] [address]  HISTORICAL INFORMATION:   Selected notes from the MEDICAL RECORD NUMBER Referred by Dr. Lucianne Lei for mac edema LEE: 07/04/21 BCVA OD: 20/40 OS: 20/100 Ocular Hx- amblyopia, macular edema OS PMH- DM, HTN, migraines, high cholesterol; last a1c was 8.2 on 07/11/21    CURRENT MEDICATIONS: Current Outpatient Medications (Ophthalmic Drugs)  Medication Sig   Polyethyl Glycol-Propyl Glycol (SYSTANE ULTRA OP) Apply 1 drop to eye daily.   No current facility-administered medications for this visit. (Ophthalmic Drugs)   Current Outpatient Medications (Other)  Medication Sig   Alcohol Swabs ([employer] PREP) 70 % PADS USE FOR CHECKING BLOOD SUGAR AT LEAST 3 TIMES DAILY   Apoaequorin (PREVAGEN PO) Take 1 capsule by mouth daily.   aspirin EC 81 MG tablet Take 1 tablet (81 mg total) by mouth daily.   BD PEN NEEDLE MICRO U/F 32G X 6 MM MISC USE TO ADMINISTER INSULIN THREE TIMES DAILY   Cetirizine HCl (ZYRTEC ALLERGY PO) Take 1 tablet by mouth daily.   Coenzyme Q10 (COQ10) 200 MG CAPS Take 1 capsule by mouth daily.   Continuous Blood Gluc Sensor (FREESTYLE LIBRE 2 SENSOR) MISC Use to check blood sugars.   dapagliflozin propanediol (FARXIGA) 5  MG TABS tablet Take 1 tablet (5 mg total) by mouth daily before breakfast.   esomeprazole (NEXIUM) 20 MG capsule Take 20 mg by mouth daily at 12 noon.   insulin aspart (NOVOLOG FLEXPEN) 100 UNIT/ML FlexPen Inject 20 Units into the skin 3 (three) times daily with meals.   insulin glargine (LANTUS SOLOSTAR) 100 UNIT/ML Solostar Pen Inject 30 Units into the skin at bedtime.   losartan-hydrochlorothiazide (HYZAAR) 100-25 MG tablet Take 1 tablet by mouth daily.   metFORMIN (GLUCOPHAGE) 500 MG tablet TAKE 1 TABLET BY MOUTH TWICE A DAY   Multiple Vitamins-Minerals (CENTRUM PO) Take 1 tablet by mouth daily.   Naproxen Sodium (ALEVE PO) Take 2 tablets by mouth daily as needed (pain).    Omega-3 Fatty Acids (OMEGA 3 PO) Take 1 capsule by mouth daily.   Probiotic Product (PROBIOTIC PO) Take 1 capsule by mouth at bedtime.   rosuvastatin (CRESTOR) 20 MG tablet TAKE 1 TABLET BY MOUTH EVERYDAY AT BEDTIME   Sennosides-Docusate Sodium (SENOKOT S PO) Take 1 tablet by mouth 2 (two) times daily.   TOPROL XL 25 MG 24 hr tablet TAKE 2 TABLETS (50 MG) IN THE MORNING AND 1 TABLET (25 MG) IN THE EVENING   Turmeric 500 MG TABS Take 1 tablet by mouth daily.   ZOLOFT 25 MG tablet Take 1 tablet (25 mg total) by mouth daily.  No current facility-administered medications for this visit. (Other)   REVIEW OF SYSTEMS: ROS   Positive for: Endocrine, Cardiovascular, Eyes Negative for: Constitutional, Gastrointestinal, Neurological, Skin, Genitourinary, Musculoskeletal, HENT, Respiratory, Psychiatric, Allergic/Imm, Heme/Lymph Last edited by Theodore Demark, COA on 07/29/2022 12:52 PM.     ALLERGIES Allergies  Allergen Reactions   Penicillins Anaphylaxis   PAST MEDICAL HISTORY Past Medical History:  Diagnosis Date   Arthritis    Blood transfusion without reported diagnosis    Depression    Diabetic neuropathy ([place])    Dizziness    Frequent headaches    Hyperlipidemia    Hypertension    Migraines     Palpitations    Urinary incontinence    Past Surgical History:  Procedure Laterality Date   none     FAMILY HISTORY Family History  Problem Relation Age of Onset   Diabetes Mother    Hypertension Mother    Diabetes Maternal Grandmother    Diabetes Maternal Grandfather    SOCIAL HISTORY Social History   Tobacco Use   Smoking status: Never   Smokeless tobacco: Never  Vaping Use   Vaping Use: Never used  Substance Use Topics   Alcohol use: Yes   Drug use: No       OPHTHALMIC EXAM:  Base Eye Exam     Visual Acuity (Snellen - Linear)       Right Left   Dist [place] 20/25 -2 20/70 -2   Dist ph  NI 20/60         Tonometry (Tonopen, 12:50 PM)       Right Left   Pressure 14 12         Pupils       Dark Light Shape React APD   Right 3 2 Round Minimal None   Left 3 2 Round Minimal None         Visual Fields (Counting fingers)       Left Right    Full Full         Extraocular Movement       Right Left    Full, Ortho Full, Ortho         Neuro/Psych     Oriented x3: Yes   Mood/Affect: Normal         Dilation     Both eyes: 1.0% Mydriacyl, 2.5% Phenylephrine @ 12:50 PM           Slit Lamp and Fundus Exam     Slit Lamp Exam       Right Left   Lids/Lashes Dermatochalasis - upper lid Dermatochalasis - upper lid   Conjunctiva/Sclera nasal and temporal pinguecula, mild melanosis nasal and temporal pinguecula, mild melanosis   Cornea 2+ Punctate epithelial erosions, mild arcus 2+ Punctate epithelial erosions, mild arcus   Anterior Chamber Deep and quiet Deep and quiet   Iris Round and dilated Round and dilated   Lens 3 piece PC IOL, open PC 3 piece PC IOL, open PC   Anterior Vitreous Vitreous syneresis, Posterior vitreous detachment, mild Asteroid hyalosis inferiorly Vitreous syneresis, Posterior vitreous detachment         Fundus Exam       Right Left   Disc Pink and Sharp, mild PPA/PPP Pink and Sharp, mild temporal PPA   C/D  Ratio 0.3 0.3   Macula Flat, Good foveal reflex, rare MA, cluster of drusen ST mac, RPE mottling Blunted foveal reflex, cluster of drusen ST mac, central cystic changes -  slightly improved, +MA, pigmented CR scarring, mild ERM temporal macula   Vessels attenuated, mild tortuosity attenuated, Tortuous   Periphery Attached, mild midzonal drusen, rare MA Attached, mild midzonal drusen, mild reticular degeneration, rare MA           Refraction     Wearing Rx       Sphere Cylinder Axis Add   Right -0.50 Sphere  +2.50   Left -0.50 +0.50 090 +2.50           IMAGING AND PROCEDURES  Imaging and Procedures for 07/29/2022  OCT, Retina - OU - Both Eyes       Right Eye Quality was good. Central Foveal Thickness: 248. Progression has been stable. Findings include normal foveal contour, no IRF, no SRF (Rare drusen).   Left Eye Quality was good. Central Foveal Thickness: 266. Progression has improved. Findings include no SRF, abnormal foveal contour, intraretinal hyper-reflective material, intraretinal fluid (Mild interval improvement in IRF and foveal contour).   Notes *Images captured and stored on drive  Diagnosis / Impression:  OD: NFP; no IRF/SRF OS: Mild interval improvement in IRF and foveal contour  Clinical management:  See below  Abbreviations: NFP - Normal foveal profile. CME - cystoid macular edema. PED - pigment epithelial detachment. IRF - intraretinal fluid. SRF - subretinal fluid. EZ - ellipsoid zone. ERM - epiretinal membrane. ORA - outer retinal atrophy. ORT - outer retinal tubulation. SRHM - subretinal hyper-reflective material. IRHM - intraretinal hyper-reflective material      Intravitreal Injection, Pharmacologic Agent - OS - Left Eye       Time Out 07/29/2022. 1:18 PM. Confirmed correct patient, procedure, site, and patient consented.   Anesthesia Topical anesthesia was used. Anesthetic medications included Lidocaine 2%, Proparacaine 0.5%.    Procedure Preparation included 5% betadine to ocular surface, eyelid speculum. A (32g) needle was used.   Injection: 2 mg aflibercept 2 MG/0.05ML   Route: Intravitreal, Site: Left Eye   NDC: A3590391, Lot: [id number], Expiration date: 02/20/2023, Waste: 0 mL   Post-op Post injection exam found visual acuity of at least counting fingers. The patient tolerated the procedure well. There were no complications. The patient received written and verbal post procedure care education. Post injection medications were not given.            ASSESSMENT/PLAN:    ICD-10-CM   1. Both eyes affected by mild nonproliferative diabetic retinopathy with macular edema, associated with type 2 diabetes mellitus (HCC)  Z16.9678 OCT, Retina - OU - Both Eyes    Intravitreal Injection, Pharmacologic Agent - OS - Left Eye    aflibercept (EYLEA) SOLN 2 mg    2. Intermediate stage nonexudative age-related macular degeneration of both eyes  H35.3132     3. Essential hypertension  I10     4. Hypertensive retinopathy of both eyes  H35.033     5. Pseudophakia, both eyes  Z96.1      Mild Non-proliferative diabetic retinopathy, both eyes - last A1c 6.8 on 3.17.23 from 8.2 on 10.20.22  - IVA OS #1 (02.21.23), #2 (03.21.23), #3 (04.18.22), #4 (05.16.23), #5 (06.13.23) -- IVA resistance  - s/p IVE OS #1 (07.17.23), #2 (08.14.23), #3 (09.11.23), #4 (10.10.23) - OD: without DME - OS: +DME - exam shows rare MA OU - unable to obtain FA -- poor IV access - BCVA OS 20/60 - improved from 20/70 - OCT shows mild interval improvement in IRF and foveal contour OS - recommend IVE OS #5 today, 11.07.23 - pt  wishes to proceed with injection - RBA of procedure discussed, questions answered - IVE informed consent obtained and signed for IVE OS on 07.17.23 - see procedure note - f/u in 4 wks -- DFE/OCT, possible injection  2. Age related macular degeneration, non-exudative, OU  - intermediate stage with mild midzonal  drusen - The incidence, anatomy, and pathology of dry AMD, risk of progression, and the AREDS and AREDS 2 studies including smoking risks discussed with patient. - continue Amsler grid monitoring  3,4. Hypertensive retinopathy OU - discussed importance of tight BP control - monitor  5. Pseudophakia OU  - s/p CE/IOL OU  - 3-piece IOLs in good position, doing well  - monitor   Ophthalmic Meds Ordered this visit:  Meds ordered this encounter  Medications   aflibercept (EYLEA) SOLN 2 mg     Return in about 4 weeks (around 08/26/2022) for f/u NPDR OU, DFE, OCT.  There are no Patient Instructions on file for this visit.   Explained the diagnoses, plan, and follow up with the patient and they expressed understanding.  Patient expressed understanding of the importance of proper follow up care.   This document serves as a record of services personally performed by Gardiner Sleeper, MD, PhD. It was created on their behalf by Renaldo Reel, [place] an ophthalmic technician. The creation of this record is the provider's dictation and/or activities during the visit.    Electronically signed by:  Renaldo Reel, COT  10.24.23 9:53 PM  This document serves as a record of services personally performed by Gardiner Sleeper, MD, PhD. It was created on their behalf by San Jetty. Owens Shark, OA an ophthalmic technician. The creation of this record is the provider's dictation and/or activities during the visit.    Electronically signed by: San Jetty. Marguerita Merles 11.07.2023 9:53 PM   Gardiner Sleeper, M.D., Ph.D. Diseases & Surgery of the Retina and Vitreous [facility]  I have reviewed the above documentation for accuracy and completeness, and I agree with the above. Gardiner Sleeper, M.D., Ph.D. 07/30/22 9:55 PM  Abbreviations: M myopia (nearsighted); A astigmatism; H hyperopia (farsighted); P presbyopia; Mrx spectacle prescription;  CTL contact lenses; OD right eye; OS left eye;  OU both eyes  XT exotropia; ET esotropia; PEK punctate epithelial keratitis; PEE punctate epithelial erosions; DES dry eye syndrome; MGD meibomian gland dysfunction; ATs artificial tears; PFAT's preservative free artificial tears; [place] nuclear sclerotic cataract; PSC posterior subcapsular cataract; ERM epi-retinal membrane; PVD posterior vitreous detachment; RD retinal detachment; DM diabetes mellitus; DR diabetic retinopathy; NPDR non-proliferative diabetic retinopathy; PDR proliferative diabetic retinopathy; CSME clinically significant macular edema; DME diabetic macular edema; dbh dot blot hemorrhages; CWS cotton wool spot; POAG primary open angle glaucoma; C/D cup-to-disc ratio; HVF humphrey visual field; GVF goldmann visual field; OCT optical coherence tomography; IOP intraocular pressure; BRVO Branch retinal vein occlusion; CRVO central retinal vein occlusion; CRAO central retinal artery occlusion; BRAO branch retinal artery occlusion; RT retinal tear; SB scleral buckle; PPV pars plana vitrectomy; VH Vitreous hemorrhage; PRP panretinal laser photocoagulation; IVK intravitreal kenalog; VMT vitreomacular traction; MH Macular hole;  NVD neovascularization of the disc; NVE neovascularization elsewhere; AREDS age related eye disease study; ARMD age related macular degeneration; POAG primary open angle glaucoma; EBMD epithelial/anterior basement membrane dystrophy; ACIOL anterior chamber intraocular lens; IOL intraocular lens; PCIOL posterior chamber intraocular lens; Phaco/IOL phacoemulsification with intraocular lens placement; [place] photorefractive keratectomy; LASIK laser assisted in situ keratomileusis; HTN hypertension; DM diabetes mellitus; COPD chronic  obstructive pulmonary disease

## 2022-07-22 DIAGNOSIS — H3581 Retinal edema: Secondary | ICD-10-CM | POA: Diagnosis not present

## 2022-07-22 DIAGNOSIS — H53003 Unspecified amblyopia, bilateral: Secondary | ICD-10-CM | POA: Diagnosis not present

## 2022-07-22 DIAGNOSIS — H04123 Dry eye syndrome of bilateral lacrimal glands: Secondary | ICD-10-CM | POA: Diagnosis not present

## 2022-07-22 DIAGNOSIS — H11153 Pinguecula, bilateral: Secondary | ICD-10-CM | POA: Diagnosis not present

## 2022-07-29 ENCOUNTER — Encounter (INDEPENDENT_AMBULATORY_CARE_PROVIDER_SITE_OTHER): Payer: Self-pay | Admitting: Ophthalmology

## 2022-07-29 ENCOUNTER — Ambulatory Visit (INDEPENDENT_AMBULATORY_CARE_PROVIDER_SITE_OTHER): Payer: Medicare Other | Admitting: Ophthalmology

## 2022-07-29 DIAGNOSIS — I10 Essential (primary) hypertension: Secondary | ICD-10-CM

## 2022-07-29 DIAGNOSIS — H35033 Hypertensive retinopathy, bilateral: Secondary | ICD-10-CM | POA: Diagnosis not present

## 2022-07-29 DIAGNOSIS — H353132 Nonexudative age-related macular degeneration, bilateral, intermediate dry stage: Secondary | ICD-10-CM

## 2022-07-29 DIAGNOSIS — E113213 Type 2 diabetes mellitus with mild nonproliferative diabetic retinopathy with macular edema, bilateral: Secondary | ICD-10-CM | POA: Diagnosis not present

## 2022-07-29 DIAGNOSIS — Z961 Presence of intraocular lens: Secondary | ICD-10-CM

## 2022-07-29 MED ORDER — AFLIBERCEPT 2MG/0.05ML IZ SOLN FOR KALEIDOSCOPE
2.0000 mg | INTRAVITREAL | Status: AC | PRN
  Administered 2022-07-29: 2 mg via INTRAVITREAL

## 2022-07-31 ENCOUNTER — Ambulatory Visit (INDEPENDENT_AMBULATORY_CARE_PROVIDER_SITE_OTHER): Payer: Medicare Other | Admitting: Family Medicine

## 2022-07-31 VITALS — BP 138/66 | HR 65 | Temp 98.7°F | Wt 152.6 lb

## 2022-07-31 DIAGNOSIS — I10 Essential (primary) hypertension: Secondary | ICD-10-CM | POA: Diagnosis not present

## 2022-07-31 DIAGNOSIS — E1140 Type 2 diabetes mellitus with diabetic neuropathy, unspecified: Secondary | ICD-10-CM | POA: Diagnosis not present

## 2022-07-31 DIAGNOSIS — F0390 Unspecified dementia without behavioral disturbance: Secondary | ICD-10-CM | POA: Diagnosis not present

## 2022-07-31 DIAGNOSIS — Z23 Encounter for immunization: Secondary | ICD-10-CM | POA: Diagnosis not present

## 2022-07-31 DIAGNOSIS — Z794 Long term (current) use of insulin: Secondary | ICD-10-CM

## 2022-07-31 LAB — COMPREHENSIVE METABOLIC PANEL
ALT: 13 U/L (ref 0–35)
AST: 22 U/L (ref 0–37)
Albumin: 4.3 g/dL (ref 3.5–5.2)
Alkaline Phosphatase: 39 U/L (ref 39–117)
BUN: 12 mg/dL (ref 6–23)
CO2: 29 mEq/L (ref 19–32)
Calcium: 10.8 mg/dL — ABNORMAL HIGH (ref 8.4–10.5)
Chloride: 104 mEq/L (ref 96–112)
Creatinine, Ser: 0.85 mg/dL (ref 0.40–1.20)
GFR: 63.05 mL/min (ref >60.00)
Glucose, Bld: 125 mg/dL — ABNORMAL HIGH (ref 70–99)
Potassium: 4.1 mEq/L (ref 3.5–5.1)
Sodium: 140 mEq/L (ref 135–145)
Total Bilirubin: 0.9 mg/dL (ref 0.2–1.2)
Total Protein: 7.5 g/dL (ref 6.0–8.3)

## 2022-07-31 LAB — MICROALBUMIN / CREATININE URINE RATIO
Creatinine,U: 60.7 mg/dL
Microalb Creat Ratio: 1.2 mg/g (ref 0.0–30.0)
Microalb, Ur: <0.7 mg/dL (ref 0.0–1.9)

## 2022-07-31 LAB — VITAMIN B12: Vitamin B-12: >1500 pg/mL — ABNORMAL HIGH (ref 211–911)

## 2022-07-31 LAB — HEMOGLOBIN A1C: Hgb A1c MFr Bld: 7.8 % — ABNORMAL HIGH (ref 4.6–6.5)

## 2022-07-31 NOTE — Progress Notes (Signed)
Subjective:    Patient ID: Karen Powell, female    DOB: November 16, 1935, 86 y.o.   MRN: [id number]  Chief Complaint  Patient presents with   Follow-up    DM    HPI Patient was seen today for for f/u.  Pt states bs has been improving. BS readings 135, 117, 121, 86, 123, 239, 146, 294, 91, 169, 129, 108, 147, 217.  Pt states elevations in bs are when she forgets mealtime insulin.  Pt living with granddaughter, but misses being in her own apt.  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    Past Medical History:  Diagnosis Date   Arthritis    Blood transfusion without reported diagnosis    Depression    Diabetic neuropathy (HCC)    Dizziness    Frequent headaches    Hyperlipidemia    Hypertension    Migraines    Palpitations    Urinary incontinence     Allergies  Allergen Reactions   Penicillins Anaphylaxis    ROS  l+memory deficit General: Denies fever, chills, night sweats, changes in weight, changes in appetite   HEENT: Denies headaches, ear pain, changes in vision, rhinorrhea, sore throat CV: Denies CP, palpitations, SOB, orthopnea Pulm: Denies SOB, cough, wheezing GI: Denies abdominal pain, nausea, vomiting, diarrhea, constipation GU: Denies dysuria, hematuria, frequency, vaginal discharge Msk: Denies muscle cramps, joint pains Neuro: Denies weakness, numbness, tingling Skin: Denies rashes, bruising Psych: Denies depression, anxiety, hallucinations     Objective:    Blood pressure 138/66, pulse 65, temperature 98.7 F (37.1 C), temperature source Oral, weight 152 lb 9.6 oz (69.2 kg), SpO2 93 %.  Gen. Pleasant, well-nourished, in no distress, normal affect   HEENT: [place]/AT, face symmetric, conjunctiva clear, no scleral icterus, PERRLA, EOMI, nares patent without drainage Lungs: no accessory muscle use, CTAB, no wheezes or rales Cardiovascular: RRR, no m/r/g, no peripheral edema Musculoskeletal: No deformities, no cyanosis or clubbing, normal tone Neuro:  A&Ox3, CN  II-XII intact, normal gait Skin:  Warm, no lesions/ rash  Wt Readings from Last 3 Encounters:  07/31/22 152 lb 9.6 oz (69.2 kg)  05/01/22 160 lb 6.4 oz (72.8 kg)  03/31/22 170 lb 3.2 oz (77.2 kg)    Lab Results  Component Value Date   WBC 5.0 02/20/2021   HGB 12.9 02/20/2021   HCT 39.1 02/20/2021   PLT 202.0 02/20/2021   GLUCOSE 433 (H) 02/20/2021   CHOL 154 02/20/2021   TRIG 126.0 02/20/2021   HDL 47.00 02/20/2021   LDLCALC 82 02/20/2021   ALT 16 02/20/2021   AST 20 02/20/2021   NA 141 02/20/2021   K 4.3 02/20/2021   CL 103 02/20/2021   CREATININE 0.99 02/20/2021   BUN 26 (H) 02/20/2021   CO2 19 02/20/2021   TSH 1.03 02/20/2021   HGBA1C 6.8 (A) 12/06/2021    Assessment/Plan:  Type 2 diabetes mellitus with diabetic neuropathy, with long-term current use of insulin (HCC)  -controlled -Hgb A1C 6.8% on 12/06/21 -continue current medsL novolog 20 units TID with meals, lantus 30 units, metformin 500 mg BID, and farxiga 5 mg in am -continue statin and ARB -discussed immunizations.  Pt to consider PNA, shingles.  Influenza given this visit. - Plan: CMP, Vitamin B12, Microalbumin/Creatinine Ratio, Urine, Hemoglobin A1c  Essential hypertension  -controlled -continue current meds including losartan-HCTZ 100-25 mg, toprol XL 50 mg in am and  25 mg in pm -continue f/u with Cards - Plan: CMP  Dementia without behavioral disturbance (HCC)  -  discussed medication options and family involvement.  Pt declines. - Plan: Vitamin B12  Need for immunization against influenza  - Plan: Flu Vaccine QUAD High Dose(Fluad)  F/u in 3-4 months, sooner if needed  Abbe Amsterdam, MD

## 2022-07-31 NOTE — Patient Instructions (Signed)
You can get a shingles vaccine at your local pharmacy.

## 2022-08-18 NOTE — Progress Notes (Signed)
[facility] Note  08/26/2022     CHIEF COMPLAINT Patient presents for Retina Follow Up   HISTORY OF PRESENT ILLNESS: Karen Powell is a 86 y.o. female who presents to the clinic today for:   HPI     Retina Follow Up   Patient presents with  Diabetic Retinopathy.  In both eyes.  This started months ago.  Duration of 4 weeks.  I, the attending physician,  performed the HPI with the patient and updated documentation appropriately.        Comments   Patient states that she cries every morning and night. She feels that the vision is the same. She is using AT's OU PRN. She feels that she is getting weaker by the day. Her blood sugar was 129.       Last edited by Bernarda Caffey, MD on 08/26/2022  1:22 PM.    Pt states her vision is blurry  Referring physician: Billie Ruddy, MD [place],  [place] [address]  HISTORICAL INFORMATION:   Selected notes from the MEDICAL RECORD NUMBER Referred by Dr. Lucianne Lei for mac edema LEE: 07/04/21 BCVA OD: 20/40 OS: 20/100 Ocular Hx- amblyopia, macular edema OS PMH- DM, HTN, migraines, high cholesterol; last a1c was 8.2 on 07/11/21    CURRENT MEDICATIONS: Current Outpatient Medications (Ophthalmic Drugs)  Medication Sig   Polyethyl Glycol-Propyl Glycol (SYSTANE ULTRA OP) Apply 1 drop to eye daily.   No current facility-administered medications for this visit. (Ophthalmic Drugs)   Current Outpatient Medications (Other)  Medication Sig   Alcohol Swabs ([employer] PREP) 70 % PADS USE FOR CHECKING BLOOD SUGAR AT LEAST 3 TIMES DAILY   Apoaequorin (PREVAGEN PO) Take 1 capsule by mouth daily.   aspirin EC 81 MG tablet Take 1 tablet (81 mg total) by mouth daily.   BD PEN NEEDLE MICRO U/F 32G X 6 MM MISC USE TO ADMINISTER INSULIN THREE TIMES DAILY   Cetirizine HCl (ZYRTEC ALLERGY PO) Take 1 tablet by mouth daily.   Coenzyme Q10 (COQ10) 200 MG CAPS Take 1 capsule by mouth daily.   Continuous Blood Gluc Sensor  (FREESTYLE LIBRE 2 SENSOR) MISC Use to check blood sugars.   dapagliflozin propanediol (FARXIGA) 5 MG TABS tablet Take 1 tablet (5 mg total) by mouth daily before breakfast.   esomeprazole (NEXIUM) 20 MG capsule Take 20 mg by mouth daily at 12 noon.   insulin aspart (NOVOLOG FLEXPEN) 100 UNIT/ML FlexPen Inject 20 Units into the skin 3 (three) times daily with meals.   insulin glargine (LANTUS SOLOSTAR) 100 UNIT/ML Solostar Pen Inject 30 Units into the skin at bedtime.   losartan-hydrochlorothiazide (HYZAAR) 100-25 MG tablet Take 1 tablet by mouth daily.   metFORMIN (GLUCOPHAGE) 500 MG tablet TAKE 1 TABLET BY MOUTH TWICE A DAY   Multiple Vitamins-Minerals (CENTRUM PO) Take 1 tablet by mouth daily.   Naproxen Sodium (ALEVE PO) Take 2 tablets by mouth daily as needed (pain).    Omega-3 Fatty Acids (OMEGA 3 PO) Take 1 capsule by mouth daily.   Probiotic Product (PROBIOTIC PO) Take 1 capsule by mouth at bedtime.   rosuvastatin (CRESTOR) 20 MG tablet TAKE 1 TABLET BY MOUTH EVERYDAY AT BEDTIME   Sennosides-Docusate Sodium (SENOKOT S PO) Take 1 tablet by mouth 2 (two) times daily.   TOPROL XL 25 MG 24 hr tablet TAKE 2 TABLETS (50 MG) IN THE MORNING AND 1 TABLET (25 MG) IN THE EVENING   Turmeric 500 MG TABS  Take 1 tablet by mouth daily.   ZOLOFT 25 MG tablet Take 1 tablet (25 mg total) by mouth daily.   No current facility-administered medications for this visit. (Other)   REVIEW OF SYSTEMS: ROS   Positive for: Endocrine, Cardiovascular, Eyes Negative for: Constitutional, Gastrointestinal, Neurological, Skin, Genitourinary, Musculoskeletal, HENT, Respiratory, Psychiatric, Allergic/Imm, Heme/Lymph Last edited by Annie Paras, COT on 08/26/2022 12:35 PM.     ALLERGIES Allergies  Allergen Reactions   Penicillins Anaphylaxis   PAST MEDICAL HISTORY Past Medical History:  Diagnosis Date   Arthritis    Blood transfusion without reported diagnosis    Depression    Diabetic neuropathy  ([place])    Dizziness    Frequent headaches    Hyperlipidemia    Hypertension    Migraines    Palpitations    Urinary incontinence    Past Surgical History:  Procedure Laterality Date   none     FAMILY HISTORY Family History  Problem Relation Age of Onset   Diabetes Mother    Hypertension Mother    Diabetes Maternal Grandmother    Diabetes Maternal Grandfather    SOCIAL HISTORY Social History   Tobacco Use   Smoking status: Never   Smokeless tobacco: Never  Vaping Use   Vaping Use: Never used  Substance Use Topics   Alcohol use: Yes   Drug use: No       OPHTHALMIC EXAM:  Base Eye Exam     Visual Acuity (Snellen - Linear)       Right Left   Dist [place] 20/25 +1 20/70   Dist ph [place]  20/60         Tonometry (Tonopen, 12:40 PM)       Right Left   Pressure 14 14         Pupils       Dark Light Shape React APD   Right 3 2 Round Minimal None   Left 3 2 Round Minimal None         Visual Fields       Left Right    Full Full         Extraocular Movement       Right Left    Full, Ortho Full, Ortho         Neuro/Psych     Oriented x3: Yes   Mood/Affect: Normal         Dilation     Both eyes: 1.0% Mydriacyl, 2.5% Phenylephrine @ 12:36 PM           Slit Lamp and Fundus Exam     Slit Lamp Exam       Right Left   Lids/Lashes Dermatochalasis - upper lid Dermatochalasis - upper lid   Conjunctiva/Sclera nasal and temporal pinguecula, mild melanosis nasal and temporal pinguecula, mild melanosis   Cornea 2+ Punctate epithelial erosions, mild arcus 2+ Punctate epithelial erosions, mild arcus   Anterior Chamber Deep and quiet Deep and quiet   Iris Round and dilated Round and dilated   Lens 3 piece PC IOL, open PC 3 piece PC IOL, open PC   Anterior Vitreous Vitreous syneresis, Posterior vitreous detachment, mild Asteroid hyalosis inferiorly Vitreous syneresis, Posterior vitreous detachment         Fundus Exam       Right Left   Disc  Pink and Sharp, mild PPA/PPP Pink and Sharp, mild temporal PPA   C/D Ratio 0.3 0.3   Macula Flat, Good foveal reflex, rare  MA, cluster of drusen ST mac, RPE mottling Blunted foveal reflex, cluster of drusen ST mac, central cystic changes - slightly improved, +MA, pigmented CR scarring, mild ERM temporal macula   Vessels attenuated, Tortuous attenuated, Tortuous   Periphery Attached, mild midzonal drusen, rare MA Attached, mild midzonal drusen, mild reticular degeneration, rare MA           Refraction     Wearing Rx       Sphere Cylinder Axis Add   Right -0.50 Sphere  +2.50   Left -0.50 +0.50 090 +2.50           IMAGING AND PROCEDURES  Imaging and Procedures for 08/26/2022  OCT, Retina - OU - Both Eyes       Right Eye Quality was good. Central Foveal Thickness: 249. Progression has been stable. Findings include normal foveal contour, no IRF, no SRF (Rare drusen).   Left Eye Quality was good. Central Foveal Thickness: 251. Progression has improved. Findings include no SRF, abnormal foveal contour, intraretinal hyper-reflective material, intraretinal fluid, outer retinal atrophy (Mild interval improvement in IRF and foveal contour).   Notes *Images captured and stored on drive  Diagnosis / Impression:  OD: NFP; no IRF/SRF OS: Mild interval improvement in IRF and foveal contour  Clinical management:  See below  Abbreviations: NFP - Normal foveal profile. CME - cystoid macular edema. PED - pigment epithelial detachment. IRF - intraretinal fluid. SRF - subretinal fluid. EZ - ellipsoid zone. ERM - epiretinal membrane. ORA - outer retinal atrophy. ORT - outer retinal tubulation. SRHM - subretinal hyper-reflective material. IRHM - intraretinal hyper-reflective material      Intravitreal Injection, Pharmacologic Agent - OS - Left Eye       Time Out 08/26/2022. 12:53 PM. Confirmed correct patient, procedure, site, and patient consented.   Anesthesia Topical anesthesia was  used. Anesthetic medications included Lidocaine 2%, Proparacaine 0.5%.   Procedure Preparation included 5% betadine to ocular surface, eyelid speculum. A (32g) needle was used.   Injection: 2 mg aflibercept 2 MG/0.05ML   Route: Intravitreal, Site: Left Eye   NDC: A3590391, Lot: [id number], Expiration date: 11/20/2023, Waste: 0 mL   Post-op Post injection exam found visual acuity of at least counting fingers. The patient tolerated the procedure well. There were no complications. The patient received written and verbal post procedure care education. Post injection medications were not given.            ASSESSMENT/PLAN:    ICD-10-CM   1. Both eyes affected by mild nonproliferative diabetic retinopathy with macular edema, associated with type 2 diabetes mellitus (HCC)  H85.2778 OCT, Retina - OU - Both Eyes    Intravitreal Injection, Pharmacologic Agent - OS - Left Eye    aflibercept (EYLEA) SOLN 2 mg    2. Intermediate stage nonexudative age-related macular degeneration of both eyes  H35.3132     3. Essential hypertension  I10     4. Hypertensive retinopathy of both eyes  H35.033     5. Pseudophakia, both eyes  Z96.1      Mild Non-proliferative diabetic retinopathy, both eyes - last A1c 6.8 on 3.17.23 from 8.2 on 10.20.22  - IVA OS #1 (02.21.23), #2 (03.21.23), #3 (04.18.22), #4 (05.16.23), #5 (06.13.23) -- IVA resistance  - s/p IVE OS #1 (07.17.23), #2 (08.14.23), #3 (09.11.23), #4 (10.10.23), #5 (11.07.23) - OD: without DME - OS: +DME - exam shows rare MA OU - unable to obtain FA -- poor IV access - BCVA OS 20/60 -  improved from 20/70 - OCT shows mild interval improvement in IRF and foveal contour OS - recommend IVE OS #6 today, 12.05.23 - pt wishes to proceed with injection - RBA of procedure discussed, questions answered - IVE informed consent obtained and signed for IVE OS on 07.17.23 - see procedure note - f/u in 4 wks -- DFE/OCT, possible injection  2. Age  related macular degeneration, non-exudative, OU  - intermediate stage with mild midzonal drusen - The incidence, anatomy, and pathology of dry AMD, risk of progression, and the AREDS and AREDS 2 studies including smoking risks discussed with patient. - continue Amsler grid monitoring  3,4. Hypertensive retinopathy OU - discussed importance of tight BP control - monitor  5. Pseudophakia OU  - s/p CE/IOL OU  - 3-piece IOLs in good position, doing well  - monitor  Ophthalmic Meds Ordered this visit:  Meds ordered this encounter  Medications   aflibercept (EYLEA) SOLN 2 mg     Return in about 4 weeks (around 09/23/2022) for f/u NPDR OU, DFE, OCT.  There are no Patient Instructions on file for this visit.   Explained the diagnoses, plan, and follow up with the patient and they expressed understanding.  Patient expressed understanding of the importance of proper follow up care.   This document serves as a record of services personally performed by Gardiner Sleeper, MD, PhD. It was created on their behalf by Renaldo Reel, [place] an ophthalmic technician. The creation of this record is the provider's dictation and/or activities during the visit.    Electronically signed by:  Renaldo Reel, COT  11.27.23 8:35 AM  This document serves as a record of services personally performed by Gardiner Sleeper, MD, PhD. It was created on their behalf by San Jetty. Owens Shark, OA an ophthalmic technician. The creation of this record is the provider's dictation and/or activities during the visit.    Electronically signed by: San Jetty. Owens Shark, [place] 12.05.2023 8:35 AM  Gardiner Sleeper, M.D., Ph.D. Diseases & Surgery of the Retina and Vitreous [facility]  I have reviewed the above documentation for accuracy and completeness, and I agree with the above. Gardiner Sleeper, M.D., Ph.D. 08/27/22 8:36 AM   Abbreviations: M myopia (nearsighted); A astigmatism; H hyperopia (farsighted); P  presbyopia; Mrx spectacle prescription;  CTL contact lenses; OD right eye; OS left eye; OU both eyes  XT exotropia; ET esotropia; PEK punctate epithelial keratitis; PEE punctate epithelial erosions; DES dry eye syndrome; MGD meibomian gland dysfunction; ATs artificial tears; PFAT's preservative free artificial tears; [place] nuclear sclerotic cataract; PSC posterior subcapsular cataract; ERM epi-retinal membrane; PVD posterior vitreous detachment; RD retinal detachment; DM diabetes mellitus; DR diabetic retinopathy; NPDR non-proliferative diabetic retinopathy; PDR proliferative diabetic retinopathy; CSME clinically significant macular edema; DME diabetic macular edema; dbh dot blot hemorrhages; CWS cotton wool spot; POAG primary open angle glaucoma; C/D cup-to-disc ratio; HVF humphrey visual field; GVF goldmann visual field; OCT optical coherence tomography; IOP intraocular pressure; BRVO Branch retinal vein occlusion; CRVO central retinal vein occlusion; CRAO central retinal artery occlusion; BRAO branch retinal artery occlusion; RT retinal tear; SB scleral buckle; PPV pars plana vitrectomy; VH Vitreous hemorrhage; PRP panretinal laser photocoagulation; IVK intravitreal kenalog; VMT vitreomacular traction; MH Macular hole;  NVD neovascularization of the disc; NVE neovascularization elsewhere; AREDS age related eye disease study; ARMD age related macular degeneration; POAG primary open angle glaucoma; EBMD epithelial/anterior basement membrane dystrophy; ACIOL anterior chamber intraocular lens; IOL intraocular lens; PCIOL posterior chamber intraocular  lens; Phaco/IOL phacoemulsification with intraocular lens placement; [place] photorefractive keratectomy; LASIK laser assisted in situ keratomileusis; HTN hypertension; DM diabetes mellitus; COPD chronic obstructive pulmonary disease

## 2022-08-19 ENCOUNTER — Encounter: Payer: Self-pay | Admitting: Family Medicine

## 2022-08-26 ENCOUNTER — Ambulatory Visit (INDEPENDENT_AMBULATORY_CARE_PROVIDER_SITE_OTHER): Payer: Medicare Other | Admitting: Ophthalmology

## 2022-08-26 ENCOUNTER — Encounter (INDEPENDENT_AMBULATORY_CARE_PROVIDER_SITE_OTHER): Payer: Self-pay | Admitting: Ophthalmology

## 2022-08-26 ENCOUNTER — Telehealth: Payer: Self-pay | Admitting: Family Medicine

## 2022-08-26 DIAGNOSIS — I10 Essential (primary) hypertension: Secondary | ICD-10-CM | POA: Diagnosis not present

## 2022-08-26 DIAGNOSIS — H353132 Nonexudative age-related macular degeneration, bilateral, intermediate dry stage: Secondary | ICD-10-CM | POA: Diagnosis not present

## 2022-08-26 DIAGNOSIS — H35033 Hypertensive retinopathy, bilateral: Secondary | ICD-10-CM

## 2022-08-26 DIAGNOSIS — E113213 Type 2 diabetes mellitus with mild nonproliferative diabetic retinopathy with macular edema, bilateral: Secondary | ICD-10-CM | POA: Diagnosis not present

## 2022-08-26 DIAGNOSIS — Z961 Presence of intraocular lens: Secondary | ICD-10-CM | POA: Diagnosis not present

## 2022-08-26 MED ORDER — AFLIBERCEPT 2MG/0.05ML IZ SOLN FOR KALEIDOSCOPE
2.0000 mg | INTRAVITREAL | Status: AC | PRN
  Administered 2022-08-26: 2 mg via INTRAVITREAL

## 2022-08-26 NOTE — Telephone Encounter (Signed)
Patient returning call for results. Left updated contact number [phone]

## 2022-08-27 NOTE — Telephone Encounter (Signed)
Spoke with patient about lab results.   Phone number updated.

## 2022-09-04 ENCOUNTER — Other Ambulatory Visit: Payer: Self-pay | Admitting: Family Medicine

## 2022-09-04 DIAGNOSIS — E1140 Type 2 diabetes mellitus with diabetic neuropathy, unspecified: Secondary | ICD-10-CM

## 2022-09-18 ENCOUNTER — Other Ambulatory Visit: Payer: Self-pay | Admitting: Family Medicine

## 2022-09-18 DIAGNOSIS — I10 Essential (primary) hypertension: Secondary | ICD-10-CM

## 2022-09-24 NOTE — Progress Notes (Signed)
[facility] Note  09/25/2022     CHIEF COMPLAINT Patient presents for Retina Follow Up   HISTORY OF PRESENT ILLNESS: Karen Powell is a 87 y.o. female who presents to the clinic today for:   HPI     Retina Follow Up   This started 4 weeks ago.  Duration of 4 weeks.  I, the attending physician,  performed the HPI with the patient and updated documentation appropriately.        Comments   4 week retina follow up NPDR OU and I'VE OS pt is reporting blurred vision pt is reporting her last blood sugar reading 113 now       Last edited by Bernarda Caffey, MD on 09/25/2022  2:02 PM.     Pt states her vision is blurry, she has been using drops for dryness  Referring physician: Billie Ruddy, MD [place],  [place] [address]  HISTORICAL INFORMATION:   Selected notes from the MEDICAL RECORD NUMBER Referred by Dr. Lucianne Lei for mac edema LEE: 07/04/21 BCVA OD: 20/40 OS: 20/100 Ocular Hx- amblyopia, macular edema OS PMH- DM, HTN, migraines, high cholesterol; last a1c was 8.2 on 07/11/21    CURRENT MEDICATIONS: Current Outpatient Medications (Ophthalmic Drugs)  Medication Sig   Polyethyl Glycol-Propyl Glycol (SYSTANE ULTRA OP) Apply 1 drop to eye daily.   No current facility-administered medications for this visit. (Ophthalmic Drugs)   Current Outpatient Medications (Other)  Medication Sig   Alcohol Swabs ([employer] PREP) 70 % PADS USE FOR CHECKING BLOOD SUGAR AT LEAST 3 TIMES DAILY   Apoaequorin (PREVAGEN PO) Take 1 capsule by mouth daily.   aspirin EC 81 MG tablet Take 1 tablet (81 mg total) by mouth daily.   BD PEN NEEDLE MICRO U/F 32G X 6 MM MISC USE TO ADMINISTER INSULIN THREE TIMES DAILY   Cetirizine HCl (ZYRTEC ALLERGY PO) Take 1 tablet by mouth daily.   Coenzyme Q10 (COQ10) 200 MG CAPS Take 1 capsule by mouth daily.   Continuous Blood Gluc Sensor (FREESTYLE LIBRE 2 SENSOR) MISC Use to check blood sugars.   dapagliflozin propanediol  (FARXIGA) 5 MG TABS tablet Take 1 tablet (5 mg total) by mouth daily before breakfast.   esomeprazole (NEXIUM) 20 MG capsule Take 20 mg by mouth daily at 12 noon.   insulin aspart (NOVOLOG FLEXPEN) 100 UNIT/ML FlexPen Inject 20 Units into the skin 3 (three) times daily with meals.   insulin glargine (LANTUS SOLOSTAR) 100 UNIT/ML Solostar Pen Inject 30 Units into the skin at bedtime.   losartan-hydrochlorothiazide (HYZAAR) 100-25 MG tablet TAKE 1 TABLET BY MOUTH EVERY DAY   metFORMIN (GLUCOPHAGE) 500 MG tablet TAKE 1 TABLET BY MOUTH TWICE A DAY   Multiple Vitamins-Minerals (CENTRUM PO) Take 1 tablet by mouth daily.   Naproxen Sodium (ALEVE PO) Take 2 tablets by mouth daily as needed (pain).    Omega-3 Fatty Acids (OMEGA 3 PO) Take 1 capsule by mouth daily.   Probiotic Product (PROBIOTIC PO) Take 1 capsule by mouth at bedtime.   rosuvastatin (CRESTOR) 20 MG tablet TAKE 1 TABLET BY MOUTH EVERYDAY AT BEDTIME   Sennosides-Docusate Sodium (SENOKOT S PO) Take 1 tablet by mouth 2 (two) times daily.   TOPROL XL 25 MG 24 hr tablet TAKE 2 TABLETS (50 MG) IN THE MORNING AND 1 TABLET (25 MG) IN THE EVENING   Turmeric 500 MG TABS Take 1 tablet by mouth daily.   ZOLOFT 25 MG tablet Take 1  tablet (25 mg total) by mouth daily.   No current facility-administered medications for this visit. (Other)   REVIEW OF SYSTEMS: ROS   Positive for: Eyes Last edited by Bernarda Caffey, MD on 09/25/2022  2:02 PM.     ALLERGIES Allergies  Allergen Reactions   Penicillins Anaphylaxis   PAST MEDICAL HISTORY Past Medical History:  Diagnosis Date   Arthritis    Blood transfusion without reported diagnosis    Depression    Diabetic neuropathy ([place])    Dizziness    Frequent headaches    Hyperlipidemia    Hypertension    Migraines    Palpitations    Urinary incontinence    Past Surgical History:  Procedure Laterality Date   none     FAMILY HISTORY Family History  Problem Relation Age of Onset   Diabetes  Mother    Hypertension Mother    Diabetes Maternal Grandmother    Diabetes Maternal Grandfather    SOCIAL HISTORY Social History   Tobacco Use   Smoking status: Never   Smokeless tobacco: Never  Vaping Use   Vaping Use: Never used  Substance Use Topics   Alcohol use: Yes   Drug use: No       OPHTHALMIC EXAM:  Base Eye Exam     Visual Acuity (Snellen - Linear)       Right Left   Dist [place] 20/25 -1 20/70   Dist ph [place]  20/60 -1    Correction: Glasses         Tonometry (Tonopen, 9:16 AM)       Right Left   Pressure 13 10         Pupils       Pupils Dark Light Shape React APD   Right PERRL 3 2 Round Brisk None   Left PERRL 3 2 Round Brisk None         Visual Fields       Left Right    Full Full         Extraocular Movement       Right Left    Full, Ortho Full, Ortho         Neuro/Psych     Oriented x3: Yes   Mood/Affect: Normal         Dilation     Both eyes: 2.5% Phenylephrine @ 9:17 AM           Slit Lamp and Fundus Exam     Slit Lamp Exam       Right Left   Lids/Lashes Dermatochalasis - upper lid Dermatochalasis - upper lid   Conjunctiva/Sclera nasal and temporal pinguecula, mild melanosis nasal and temporal pinguecula, mild melanosis   Cornea 2+ Punctate epithelial erosions, mild arcus 2+ Punctate epithelial erosions, mild arcus   Anterior Chamber Deep and quiet Deep and quiet   Iris Round and dilated Round and dilated   Lens 3 piece PC IOL, open PC 3 piece PC IOL, open PC   Anterior Vitreous Vitreous syneresis, Posterior vitreous detachment, mild Asteroid hyalosis inferiorly Vitreous syneresis, Posterior vitreous detachment         Fundus Exam       Right Left   Disc Pink and Sharp, mild PPA/PPP Pink and Sharp, mild temporal PPA   C/D Ratio 0.3 0.3   Macula Flat, Good foveal reflex, rare MA, cluster of drusen ST mac, RPE mottling good foveal reflex, cluster of drusen ST mac, central cystic changes - slightly  improved, +  MA, pigmented CR scarring, mild ERM temporal macula   Vessels attenuated, Tortuous attenuated, Tortuous   Periphery Attached, mild midzonal drusen, rare MA Attached, mild midzonal drusen, mild reticular degeneration, rare MA           Refraction     Wearing Rx       Sphere Cylinder Axis Add   Right -0.50 Sphere  +2.50   Left -0.50 +0.50 090 +2.50           IMAGING AND PROCEDURES  Imaging and Procedures for 09/25/2022  OCT, Retina - OU - Both Eyes       Right Eye Quality was good. Central Foveal Thickness: 245. Progression has been stable. Findings include normal foveal contour, no IRF, no SRF (Rare drusen).   Left Eye Quality was good. Central Foveal Thickness: 249. Progression has improved. Findings include normal foveal contour, no SRF, intraretinal hyper-reflective material, intraretinal fluid, outer retinal atrophy (Mild interval improvement in IRF and foveal contour).   Notes *Images captured and stored on drive  Diagnosis / Impression:  OD: NFP; no IRF/SRF OS: Mild interval improvement in IRF and foveal contour  Clinical management:  See below  Abbreviations: NFP - Normal foveal profile. CME - cystoid macular edema. PED - pigment epithelial detachment. IRF - intraretinal fluid. SRF - subretinal fluid. EZ - ellipsoid zone. ERM - epiretinal membrane. ORA - outer retinal atrophy. ORT - outer retinal tubulation. SRHM - subretinal hyper-reflective material. IRHM - intraretinal hyper-reflective material      Intravitreal Injection, Pharmacologic Agent - OS - Left Eye       Time Out 09/25/2022. 10:17 AM. Confirmed correct patient, procedure, site, and patient consented.   Anesthesia Topical anesthesia was used. Anesthetic medications included Lidocaine 2%, Proparacaine 0.5%.   Procedure Preparation included 5% betadine to ocular surface, eyelid speculum. A (32g) needle was used.   Injection: 2 mg aflibercept 2 MG/0.05ML   Route: Intravitreal, Site:  Left Eye   NDC: [id number], Lot: [id number], Expiration date: 12/20/2023, Waste: 0 mL   Post-op Post injection exam found visual acuity of at least counting fingers. The patient tolerated the procedure well. There were no complications. The patient received written and verbal post procedure care education. Post injection medications were not given.            ASSESSMENT/PLAN:    ICD-10-CM   1. Both eyes affected by mild nonproliferative diabetic retinopathy with macular edema, associated with type 2 diabetes mellitus (HCC)  C94.7096 OCT, Retina - OU - Both Eyes    Intravitreal Injection, Pharmacologic Agent - OS - Left Eye    aflibercept (EYLEA) SOLN 2 mg    2. Intermediate stage nonexudative age-related macular degeneration of both eyes  H35.3132     3. Essential hypertension  I10     4. Hypertensive retinopathy of both eyes  H35.033     5. Pseudophakia, both eyes  Z96.1      Mild Non-proliferative diabetic retinopathy, both eyes - last A1c 6.8 on 3.17.23 from 8.2 on 10.20.22  - IVA OS #1 (02.21.23), #2 (03.21.23), #3 (04.18.22), #4 (05.16.23), #5 (06.13.23) -- IVA resistance  - s/p IVE OS #1 (07.17.23), #2 (08.14.23), #3 (09.11.23), #4 (10.10.23), #5 (11.07.23), #6 (12.05.23) - OD: without DME - OS: +DME - exam shows rare MA OU - unable to obtain FA -- poor IV access - BCVA OS 20/60 - improved from 20/70 - OCT shows mild interval improvement in IRF and foveal contour OS at 4 weeks - recommend  IVE OS #7 today, 01.04.24 w/ f/u in 4-5 wks - pt wishes to proceed with injection - RBA of procedure discussed, questions answered - IVE informed consent obtained and signed for IVE OS on 07.17.23 - see procedure note - f/u in 4-5 wks -- DFE/OCT, possible injection  2. Age related macular degeneration, non-exudative, OU  - intermediate stage with mild midzonal drusen - The incidence, anatomy, and pathology of dry AMD, risk of progression, and the AREDS and AREDS 2 studies  including smoking risks discussed with patient. - continue Amsler grid monitoring  3,4. Hypertensive retinopathy OU - discussed importance of tight BP control - monitor  5. Pseudophakia OU  - s/p CE/IOL OU  - 3-piece IOLs in good position, doing well  - monitor  Ophthalmic Meds Ordered this visit:  Meds ordered this encounter  Medications   aflibercept (EYLEA) SOLN 2 mg     Return for f/u 4-5 weeks, NPDR OU, DFE, OCT, Possible Injxn.  There are no Patient Instructions on file for this visit.   Explained the diagnoses, plan, and follow up with the patient and they expressed understanding.  Patient expressed understanding of the importance of proper follow up care.   This document serves as a record of services personally performed by Gardiner Sleeper, MD, PhD. It was created on their behalf by San Jetty. Owens Shark, OA an ophthalmic technician. The creation of this record is the provider's dictation and/or activities during the visit.    Electronically signed by: San Jetty. Owens Shark, [place] 01.03.2024 2:04 PM  Gardiner Sleeper, M.D., Ph.D. Diseases & Surgery of the Retina and Vitreous [facility]  I have reviewed the above documentation for accuracy and completeness, and I agree with the above. Gardiner Sleeper, M.D., Ph.D. 09/25/22 2:04 PM  Abbreviations: M myopia (nearsighted); A astigmatism; H hyperopia (farsighted); P presbyopia; Mrx spectacle prescription;  CTL contact lenses; OD right eye; OS left eye; OU both eyes  XT exotropia; ET esotropia; PEK punctate epithelial keratitis; PEE punctate epithelial erosions; DES dry eye syndrome; MGD meibomian gland dysfunction; ATs artificial tears; PFAT's preservative free artificial tears; [place] nuclear sclerotic cataract; PSC posterior subcapsular cataract; ERM epi-retinal membrane; PVD posterior vitreous detachment; RD retinal detachment; DM diabetes mellitus; DR diabetic retinopathy; NPDR non-proliferative diabetic retinopathy;  PDR proliferative diabetic retinopathy; CSME clinically significant macular edema; DME diabetic macular edema; dbh dot blot hemorrhages; CWS cotton wool spot; POAG primary open angle glaucoma; C/D cup-to-disc ratio; HVF humphrey visual field; GVF goldmann visual field; OCT optical coherence tomography; IOP intraocular pressure; BRVO Branch retinal vein occlusion; CRVO central retinal vein occlusion; CRAO central retinal artery occlusion; BRAO branch retinal artery occlusion; RT retinal tear; SB scleral buckle; PPV pars plana vitrectomy; VH Vitreous hemorrhage; PRP panretinal laser photocoagulation; IVK intravitreal kenalog; VMT vitreomacular traction; MH Macular hole;  NVD neovascularization of the disc; NVE neovascularization elsewhere; AREDS age related eye disease study; ARMD age related macular degeneration; POAG primary open angle glaucoma; EBMD epithelial/anterior basement membrane dystrophy; ACIOL anterior chamber intraocular lens; IOL intraocular lens; PCIOL posterior chamber intraocular lens; Phaco/IOL phacoemulsification with intraocular lens placement; [place] photorefractive keratectomy; LASIK laser assisted in situ keratomileusis; HTN hypertension; DM diabetes mellitus; COPD chronic obstructive pulmonary disease

## 2022-09-25 ENCOUNTER — Ambulatory Visit (INDEPENDENT_AMBULATORY_CARE_PROVIDER_SITE_OTHER): Payer: Medicare Other | Admitting: Ophthalmology

## 2022-09-25 ENCOUNTER — Encounter (INDEPENDENT_AMBULATORY_CARE_PROVIDER_SITE_OTHER): Payer: Self-pay | Admitting: Ophthalmology

## 2022-09-25 DIAGNOSIS — I10 Essential (primary) hypertension: Secondary | ICD-10-CM | POA: Diagnosis not present

## 2022-09-25 DIAGNOSIS — H35033 Hypertensive retinopathy, bilateral: Secondary | ICD-10-CM

## 2022-09-25 DIAGNOSIS — E113213 Type 2 diabetes mellitus with mild nonproliferative diabetic retinopathy with macular edema, bilateral: Secondary | ICD-10-CM

## 2022-09-25 DIAGNOSIS — Z961 Presence of intraocular lens: Secondary | ICD-10-CM | POA: Diagnosis not present

## 2022-09-25 DIAGNOSIS — H353132 Nonexudative age-related macular degeneration, bilateral, intermediate dry stage: Secondary | ICD-10-CM

## 2022-09-25 MED ORDER — AFLIBERCEPT 2MG/0.05ML IZ SOLN FOR KALEIDOSCOPE
2.0000 mg | INTRAVITREAL | Status: AC | PRN
  Administered 2022-09-25: 2 mg via INTRAVITREAL

## 2022-10-14 NOTE — Progress Notes (Signed)
[facility] Note  10/23/2022     CHIEF COMPLAINT Patient presents for Retina Follow Up   HISTORY OF PRESENT ILLNESS: Karen Powell is a 87 y.o. female who presents to the clinic today for:   HPI     Retina Follow Up   Patient presents with  Diabetic Retinopathy.  In both eyes.  This started years ago.  Duration of 4 weeks.  I, the attending physician,  performed the HPI with the patient and updated documentation appropriately.        Comments   Patient feels that the vision is the same and is not going to change. She is using AT's OU PRN. Her blood sugar was 104.      Last edited by Bernarda Caffey, MD on 10/23/2022 11:42 AM.     Referring physician: Billie Ruddy, MD [place],  [place] [address]  HISTORICAL INFORMATION:   Selected notes from the MEDICAL RECORD NUMBER Referred by Dr. Lucianne Lei for mac edema LEE: 07/04/21 BCVA OD: 20/40 OS: 20/100 Ocular Hx- amblyopia, macular edema OS PMH- DM, HTN, migraines, high cholesterol; last a1c was 8.2 on 07/11/21    CURRENT MEDICATIONS: Current Outpatient Medications (Ophthalmic Drugs)  Medication Sig   Polyethyl Glycol-Propyl Glycol (SYSTANE ULTRA OP) Apply 1 drop to eye daily.   No current facility-administered medications for this visit. (Ophthalmic Drugs)   Current Outpatient Medications (Other)  Medication Sig   Alcohol Swabs ([employer] PREP) 70 % PADS USE FOR CHECKING BLOOD SUGAR AT LEAST 3 TIMES DAILY   Apoaequorin (PREVAGEN PO) Take 1 capsule by mouth daily.   aspirin EC 81 MG tablet Take 1 tablet (81 mg total) by mouth daily.   BD PEN NEEDLE MICRO U/F 32G X 6 MM MISC USE TO ADMINISTER INSULIN THREE TIMES DAILY   Cetirizine HCl (ZYRTEC ALLERGY PO) Take 1 tablet by mouth daily.   Coenzyme Q10 (COQ10) 200 MG CAPS Take 1 capsule by mouth daily.   Continuous Blood Gluc Sensor (FREESTYLE LIBRE 2 SENSOR) MISC Use to check blood sugars.   dapagliflozin propanediol (FARXIGA) 5 MG TABS  tablet Take 1 tablet (5 mg total) by mouth daily before breakfast.   esomeprazole (NEXIUM) 20 MG capsule Take 20 mg by mouth daily at 12 noon.   insulin aspart (NOVOLOG FLEXPEN) 100 UNIT/ML FlexPen Inject 20 Units into the skin 3 (three) times daily with meals.   insulin glargine (LANTUS SOLOSTAR) 100 UNIT/ML Solostar Pen Inject 30 Units into the skin at bedtime.   losartan-hydrochlorothiazide (HYZAAR) 100-25 MG tablet TAKE 1 TABLET BY MOUTH EVERY DAY   metFORMIN (GLUCOPHAGE) 500 MG tablet TAKE 1 TABLET BY MOUTH TWICE A DAY   Multiple Vitamins-Minerals (CENTRUM PO) Take 1 tablet by mouth daily.   Naproxen Sodium (ALEVE PO) Take 2 tablets by mouth daily as needed (pain).    Omega-3 Fatty Acids (OMEGA 3 PO) Take 1 capsule by mouth daily.   Probiotic Product (PROBIOTIC PO) Take 1 capsule by mouth at bedtime.   rosuvastatin (CRESTOR) 20 MG tablet TAKE 1 TABLET BY MOUTH EVERYDAY AT BEDTIME   Sennosides-Docusate Sodium (SENOKOT S PO) Take 1 tablet by mouth 2 (two) times daily.   TOPROL XL 25 MG 24 hr tablet TAKE 2 TABLETS (50 MG) IN THE MORNING AND 1 TABLET (25 MG) IN THE EVENING   Turmeric 500 MG TABS Take 1 tablet by mouth daily.   ZOLOFT 25 MG tablet Take 1 tablet (25 mg total) by mouth  daily.   No current facility-administered medications for this visit. (Other)   REVIEW OF SYSTEMS: ROS   Positive for: Endocrine, Cardiovascular, Eyes Negative for: Constitutional, Gastrointestinal, Neurological, Skin, Genitourinary, Musculoskeletal, HENT, Respiratory, Psychiatric, Allergic/Imm, Heme/Lymph Last edited by Annie Paras, COT on 10/23/2022  8:55 AM.      ALLERGIES Allergies  Allergen Reactions   Penicillins Anaphylaxis   PAST MEDICAL HISTORY Past Medical History:  Diagnosis Date   Arthritis    Blood transfusion without reported diagnosis    Depression    Diabetic neuropathy ([facility])    Dizziness    Frequent headaches    Hyperlipidemia    Hypertension    Migraines     Palpitations    Urinary incontinence    Past Surgical History:  Procedure Laterality Date   none     FAMILY HISTORY Family History  Problem Relation Age of Onset   Diabetes Mother    Hypertension Mother    Diabetes Maternal Grandmother    Diabetes Maternal Grandfather    SOCIAL HISTORY Social History   Tobacco Use   Smoking status: Never   Smokeless tobacco: Never  Vaping Use   Vaping Use: Never used  Substance Use Topics   Alcohol use: Yes   Drug use: No       OPHTHALMIC EXAM:  Base Eye Exam     Visual Acuity (Snellen - Linear)       Right Left   Dist [place] 20/25 +2 20/70   Dist ph [place]  20/60         Tonometry (Tonopen, 8:58 AM)       Right Left   Pressure 13 12         Pupils       Dark Light Shape React APD   Right 3 2 Round Brisk None   Left 3 2 Round Brisk None         Visual Fields       Left Right    Full          Extraocular Movement       Right Left    Full, Ortho Full, Ortho         Neuro/Psych     Oriented x3: Yes   Mood/Affect: Normal         Dilation     Both eyes: 1.0% Mydriacyl, 2.5% Phenylephrine @ 8:55 AM           Slit Lamp and Fundus Exam     Slit Lamp Exam       Right Left   Lids/Lashes Dermatochalasis - upper lid Dermatochalasis - upper lid   Conjunctiva/Sclera nasal and temporal pinguecula, mild melanosis nasal and temporal pinguecula, mild melanosis   Cornea 2+ Punctate epithelial erosions, mild arcus 2+ Punctate epithelial erosions, mild arcus   Anterior Chamber Deep and quiet Deep and quiet   Iris Round and dilated Round and dilated   Lens 3 piece PC IOL, open PC 3 piece PC IOL, open PC   Anterior Vitreous Vitreous syneresis, Posterior vitreous detachment, mild Asteroid hyalosis inferiorly Vitreous syneresis, Posterior vitreous detachment         Fundus Exam       Right Left   Disc Pink and Sharp, mild PPA/PPP Pink and Sharp, mild temporal PPA   C/D Ratio 0.3 0.3   Macula Flat, Good  foveal reflex, rare MA, cluster of drusen ST mac, RPE mottling good foveal reflex, cluster of drusen ST mac, central cystic  changes - slightly improved, +MA, pigmented CR scarring, mild ERM temporal macula   Vessels attenuated, Tortuous attenuated, Tortuous   Periphery Attached, mild midzonal drusen, rare MA Attached, mild midzonal drusen, mild reticular degeneration, rare MA           IMAGING AND PROCEDURES  Imaging and Procedures for 10/23/2022  OCT, Retina - OU - Both Eyes       Right Eye Quality was good. Central Foveal Thickness: 248. Progression has been stable. Findings include normal foveal contour, no IRF, no SRF (Rare drusen).   Left Eye Quality was good. Central Foveal Thickness: 238. Progression has improved. Findings include normal foveal contour, no SRF, intraretinal hyper-reflective material, intraretinal fluid, outer retinal atrophy (interval improvement in central cystic changes / IRF, patchy ellipsoid signal centrally).   Notes *Images captured and stored on drive  Diagnosis / Impression:  OD: NFP; no IRF/SRF OS: interval improvement in central cystic changes / IRF, patchy ellipsoid signal centrally  Clinical management:  See below  Abbreviations: NFP - Normal foveal profile. CME - cystoid macular edema. PED - pigment epithelial detachment. IRF - intraretinal fluid. SRF - subretinal fluid. EZ - ellipsoid zone. ERM - epiretinal membrane. ORA - outer retinal atrophy. ORT - outer retinal tubulation. SRHM - subretinal hyper-reflective material. IRHM - intraretinal hyper-reflective material      Intravitreal Injection, Pharmacologic Agent - OS - Left Eye       Time Out 10/23/2022. 9:28 AM. Confirmed correct patient, procedure, site, and patient consented.   Anesthesia Topical anesthesia was used. Anesthetic medications included Lidocaine 2%, Proparacaine 0.5%.   Procedure Preparation included 5% betadine to ocular surface, eyelid speculum. A (32g) needle was  used.   Injection: 2 mg aflibercept 2 MG/0.05ML   Route: Intravitreal, Site: Left Eye   NDC: A3590391, Lot: [id number], Expiration date: 11/20/2022, Waste: 0 mL   Post-op Post injection exam found visual acuity of at least counting fingers. The patient tolerated the procedure well. There were no complications. The patient received written and verbal post procedure care education. Post injection medications were not given.            ASSESSMENT/PLAN:    ICD-10-CM   1. Both eyes affected by mild nonproliferative diabetic retinopathy with macular edema, associated with type 2 diabetes mellitus (HCC)  B20.1007 OCT, Retina - OU - Both Eyes    Intravitreal Injection, Pharmacologic Agent - OS - Left Eye    aflibercept (EYLEA) SOLN 2 mg    2. Intermediate stage nonexudative age-related macular degeneration of both eyes  H35.3132     3. Essential hypertension  I10     4. Hypertensive retinopathy of both eyes  H35.033     5. Pseudophakia, both eyes  Z96.1      Mild Non-proliferative diabetic retinopathy, both eyes - last A1c 6.8 on 3.17.23 from 8.2 on 10.20.22  - IVA OS #1 (02.21.23), #2 (03.21.23), #3 (04.18.22), #4 (05.16.23), #5 (06.13.23) -- IVA resistance  - s/p IVE OS #1 (07.17.23), #2 (08.14.23), #3 (09.11.23), #4 (10.10.23), #5 (11.07.23), #6 (12.05.23), #7 (01.04.24) - OD: without DME - OS: +DME - exam shows rare MA OU - unable to obtain FA -- poor IV access - BCVA OS stable at 20/60 - OCT shows interval improvement in central cystic changes / IRF, patchy ellipsoid signal centrally - recommend IVE OS #8 today, 02.01.24 w/ f/u in 4-5 wks - pt wishes to proceed with injection - RBA of procedure discussed, questions answered - IVE informed consent obtained  and signed for IVE OS on 07.17.23 - see procedure note - f/u in 4-5 wks -- DFE/OCT, possible injection  2. Age related macular degeneration, non-exudative, OU  - intermediate stage with mild midzonal drusen - The  incidence, anatomy, and pathology of dry AMD, risk of progression, and the AREDS and AREDS 2 studies including smoking risks discussed with patient. - continue Amsler grid monitoring  3,4. Hypertensive retinopathy OU - discussed importance of tight BP control - monitor  5. Pseudophakia OU  - s/p CE/IOL OU  - 3-piece IOLs in good position, doing well  - monitor  Ophthalmic Meds Ordered this visit:  Meds ordered this encounter  Medications   aflibercept (EYLEA) SOLN 2 mg     Return for f/u 4-5 weeks, NPDR OU, DFE, OCT.  There are no Patient Instructions on file for this visit.   Explained the diagnoses, plan, and follow up with the patient and they expressed understanding.  Patient expressed understanding of the importance of proper follow up care.   This document serves as a record of services personally performed by Gardiner Sleeper, MD, PhD. It was created on their behalf by San Jetty. Owens Shark, OA an ophthalmic technician. The creation of this record is the provider's dictation and/or activities during the visit.    Electronically signed by: San Jetty. Owens Shark, [place] 01.23.2024 11:43 AM   Gardiner Sleeper, M.D., Ph.D. Diseases & Surgery of the Retina and Vitreous [facility]  I have reviewed the above documentation for accuracy and completeness, and I agree with the above. Gardiner Sleeper, M.D., Ph.D. 10/23/22 11:43 AM   Abbreviations: M myopia (nearsighted); A astigmatism; H hyperopia (farsighted); P presbyopia; Mrx spectacle prescription;  CTL contact lenses; OD right eye; OS left eye; OU both eyes  XT exotropia; ET esotropia; PEK punctate epithelial keratitis; PEE punctate epithelial erosions; DES dry eye syndrome; MGD meibomian gland dysfunction; ATs artificial tears; PFAT's preservative free artificial tears; [place] nuclear sclerotic cataract; PSC posterior subcapsular cataract; ERM epi-retinal membrane; PVD posterior vitreous detachment; RD retinal detachment; DM  diabetes mellitus; DR diabetic retinopathy; NPDR non-proliferative diabetic retinopathy; PDR proliferative diabetic retinopathy; CSME clinically significant macular edema; DME diabetic macular edema; dbh dot blot hemorrhages; CWS cotton wool spot; POAG primary open angle glaucoma; C/D cup-to-disc ratio; HVF humphrey visual field; GVF goldmann visual field; OCT optical coherence tomography; IOP intraocular pressure; BRVO Branch retinal vein occlusion; CRVO central retinal vein occlusion; CRAO central retinal artery occlusion; BRAO branch retinal artery occlusion; RT retinal tear; SB scleral buckle; PPV pars plana vitrectomy; VH Vitreous hemorrhage; PRP panretinal laser photocoagulation; IVK intravitreal kenalog; VMT vitreomacular traction; MH Macular hole;  NVD neovascularization of the disc; NVE neovascularization elsewhere; AREDS age related eye disease study; ARMD age related macular degeneration; POAG primary open angle glaucoma; EBMD epithelial/anterior basement membrane dystrophy; ACIOL anterior chamber intraocular lens; IOL intraocular lens; PCIOL posterior chamber intraocular lens; Phaco/IOL phacoemulsification with intraocular lens placement; [place] photorefractive keratectomy; LASIK laser assisted in situ keratomileusis; HTN hypertension; DM diabetes mellitus; COPD chronic obstructive pulmonary disease

## 2022-10-23 ENCOUNTER — Encounter (INDEPENDENT_AMBULATORY_CARE_PROVIDER_SITE_OTHER): Payer: Self-pay | Admitting: Ophthalmology

## 2022-10-23 ENCOUNTER — Ambulatory Visit (INDEPENDENT_AMBULATORY_CARE_PROVIDER_SITE_OTHER): Payer: Medicare Other | Admitting: Ophthalmology

## 2022-10-23 DIAGNOSIS — H353132 Nonexudative age-related macular degeneration, bilateral, intermediate dry stage: Secondary | ICD-10-CM | POA: Diagnosis not present

## 2022-10-23 DIAGNOSIS — E113213 Type 2 diabetes mellitus with mild nonproliferative diabetic retinopathy with macular edema, bilateral: Secondary | ICD-10-CM | POA: Diagnosis not present

## 2022-10-23 DIAGNOSIS — I10 Essential (primary) hypertension: Secondary | ICD-10-CM

## 2022-10-23 DIAGNOSIS — Z961 Presence of intraocular lens: Secondary | ICD-10-CM | POA: Diagnosis not present

## 2022-10-23 DIAGNOSIS — H35033 Hypertensive retinopathy, bilateral: Secondary | ICD-10-CM | POA: Diagnosis not present

## 2022-10-23 MED ORDER — AFLIBERCEPT 2MG/0.05ML IZ SOLN FOR KALEIDOSCOPE
2.0000 mg | INTRAVITREAL | Status: AC | PRN
  Administered 2022-10-23: 2 mg via INTRAVITREAL

## 2022-10-31 ENCOUNTER — Telehealth: Payer: Self-pay | Admitting: Family Medicine

## 2022-10-31 NOTE — Telephone Encounter (Signed)
Pt is calling and just want to touch base with md . Pt has an appt in march this is nothing urgent just want to hear md voice and would like md to call her back

## 2022-11-07 ENCOUNTER — Other Ambulatory Visit: Payer: Self-pay | Admitting: Family Medicine

## 2022-11-07 ENCOUNTER — Other Ambulatory Visit: Payer: Self-pay | Admitting: *Deleted

## 2022-11-07 DIAGNOSIS — E782 Mixed hyperlipidemia: Secondary | ICD-10-CM

## 2022-11-07 MED ORDER — TOPROL XL 25 MG PO TB24: ORAL_TABLET | ORAL | 1 refills | Status: DC

## 2022-11-07 NOTE — Progress Notes (Signed)
[facility] Note  11/20/2022     CHIEF COMPLAINT Patient presents for Retina Follow Up   HISTORY OF PRESENT ILLNESS: Karen Powell is a 87 y.o. female who presents to the clinic today for:   HPI     Retina Follow Up   Patient presents with  Diabetic Retinopathy.  In both eyes.  This started 4 weeks ago.  Duration of 4 weeks.  Since onset it is gradually worsening.  I, the attending physician,  performed the HPI with the patient and updated documentation appropriately.        Comments   4 week retina follow up NPDR OU and I'VE OS pt is reporting that it seems her vision has got worse she denies any flashes or floaters her last glucose was 174 this morning       Last edited by Bernarda Caffey, MD on 11/20/2022 11:22 AM.     Patient is complaining that she is having a hard time seeing. She has been using the eye drops AT's OU BID.  Referring physician: Billie Ruddy, MD [place],  [place] [address]  HISTORICAL INFORMATION:   Selected notes from the MEDICAL RECORD NUMBER Referred by Dr. Lucianne Lei for mac edema LEE: 07/04/21 BCVA OD: 20/40 OS: 20/100 Ocular Hx- amblyopia, macular edema OS PMH- DM, HTN, migraines, high cholesterol; last a1c was 8.2 on 07/11/21    CURRENT MEDICATIONS: Current Outpatient Medications (Ophthalmic Drugs)  Medication Sig   Polyethyl Glycol-Propyl Glycol (SYSTANE ULTRA OP) Apply 1 drop to eye daily.   No current facility-administered medications for this visit. (Ophthalmic Drugs)   Current Outpatient Medications (Other)  Medication Sig   Alcohol Swabs ([employer] PREP) 70 % PADS USE FOR CHECKING BLOOD SUGAR AT LEAST 3 TIMES DAILY   Apoaequorin (PREVAGEN PO) Take 1 capsule by mouth daily.   aspirin EC 81 MG tablet Take 1 tablet (81 mg total) by mouth daily.   BD PEN NEEDLE MICRO U/F 32G X 6 MM MISC USE TO ADMINISTER INSULIN THREE TIMES DAILY   Cetirizine HCl (ZYRTEC ALLERGY PO) Take 1 tablet by mouth daily.    Coenzyme Q10 (COQ10) 200 MG CAPS Take 1 capsule by mouth daily.   Continuous Blood Gluc Sensor (FREESTYLE LIBRE 2 SENSOR) MISC Use to check blood sugars.   dapagliflozin propanediol (FARXIGA) 5 MG TABS tablet Take 1 tablet (5 mg total) by mouth daily before breakfast.   esomeprazole (NEXIUM) 20 MG capsule Take 20 mg by mouth daily at 12 noon.   insulin aspart (NOVOLOG FLEXPEN) 100 UNIT/ML FlexPen Inject 20 Units into the skin 3 (three) times daily with meals.   insulin glargine (LANTUS SOLOSTAR) 100 UNIT/ML Solostar Pen Inject 30 Units into the skin at bedtime.   losartan-hydrochlorothiazide (HYZAAR) 100-25 MG tablet TAKE 1 TABLET BY MOUTH EVERY DAY   metFORMIN (GLUCOPHAGE) 500 MG tablet TAKE 1 TABLET BY MOUTH TWICE A DAY   Multiple Vitamins-Minerals (CENTRUM PO) Take 1 tablet by mouth daily.   Naproxen Sodium (ALEVE PO) Take 2 tablets by mouth daily as needed (pain).    Omega-3 Fatty Acids (OMEGA 3 PO) Take 1 capsule by mouth daily.   Probiotic Product (PROBIOTIC PO) Take 1 capsule by mouth at bedtime.   rosuvastatin (CRESTOR) 20 MG tablet TAKE 1 TABLET BY MOUTH EVERYDAY AT BEDTIME   Sennosides-Docusate Sodium (SENOKOT S PO) Take 1 tablet by mouth 2 (two) times daily.   TOPROL XL 25 MG 24 hr tablet TAKE 2  TABLETS (50 MG) IN THE MORNING AND 1 TABLET (25 MG) IN THE EVENING   Turmeric 500 MG TABS Take 1 tablet by mouth daily.   ZOLOFT 25 MG tablet Take 1 tablet (25 mg total) by mouth daily.   No current facility-administered medications for this visit. (Other)   REVIEW OF SYSTEMS: ROS   Positive for: Endocrine, Cardiovascular, Eyes Negative for: Constitutional, Gastrointestinal, Neurological, Skin, Genitourinary, Musculoskeletal, HENT, Respiratory, Psychiatric, Allergic/Imm, Heme/Lymph Last edited by Parthenia Ames, COT on 11/20/2022  9:14 AM.     ALLERGIES Allergies  Allergen Reactions   Penicillins Anaphylaxis   PAST MEDICAL HISTORY Past Medical History:  Diagnosis Date    Arthritis    Blood transfusion without reported diagnosis    Depression    Diabetic neuropathy ([place])    Dizziness    Frequent headaches    Hyperlipidemia    Hypertension    Migraines    Palpitations    Urinary incontinence    Past Surgical History:  Procedure Laterality Date   none     FAMILY HISTORY Family History  Problem Relation Age of Onset   Diabetes Mother    Hypertension Mother    Diabetes Maternal Grandmother    Diabetes Maternal Grandfather    SOCIAL HISTORY Social History   Tobacco Use   Smoking status: Never   Smokeless tobacco: Never  Vaping Use   Vaping Use: Never used  Substance Use Topics   Alcohol use: Yes   Drug use: No       OPHTHALMIC EXAM:  Base Eye Exam     Visual Acuity (Snellen - Linear)       Right Left   Dist [place] 20/40 20/100 -3   Dist ph [place] NI NI    Correction: Glasses         Tonometry (Tonopen, 9:24 AM)       Right Left   Pressure 16 16         Pupils       Pupils Dark Light Shape React APD   Right PERRL 3 2 Round Brisk None   Left PERRL 3 2 Round Brisk None         Visual Fields       Left Right    Full          Extraocular Movement       Right Left    Full, Ortho Full, Ortho         Neuro/Psych     Oriented x3: Yes   Mood/Affect: Normal         Dilation     Both eyes: 2.5% Phenylephrine @ 9:24 AM           Slit Lamp and Fundus Exam     Slit Lamp Exam       Right Left   Lids/Lashes Dermatochalasis - upper lid Dermatochalasis - upper lid   Conjunctiva/Sclera nasal and temporal pinguecula, mild melanosis nasal and temporal pinguecula, mild melanosis   Cornea 2-3+ Punctate epithelial erosions, mild arcus trace Punctate epithelial erosions, mild arcus, mild central corneal haze   Anterior Chamber Deep and quiet Deep and quiet   Iris Round and dilated Round and dilated   Lens 3 piece PC IOL, open PC 3 piece PC IOL, open PC   Anterior Vitreous Vitreous syneresis, Posterior vitreous  detachment, mild Asteroid hyalosis inferiorly Vitreous syneresis, Posterior vitreous detachment         Fundus Exam  Right Left   Disc Pink and Sharp, mild PPA/PPP Pink and Sharp, mild temporal PPA   C/D Ratio 0.3 0.3   Macula Flat, Good foveal reflex, rare MA, cluster of drusen ST mac, RPE mottling good foveal reflex, cluster of drusen ST mac, central cystic changes - slightly improved, +MA, pigmented CR scarring, mild ERM temporal macula   Vessels attenuated, Tortuous attenuated, Tortuous   Periphery Attached, mild midzonal drusen, rare MA Attached, mild midzonal drusen, mild reticular degeneration, rare MA           Refraction     Wearing Rx       Sphere Cylinder Axis Add   Right -0.50 Sphere  +2.50   Left -0.50 +0.50 090 +2.50         Final Rx       Sphere Cylinder Axis Add   Right -0.50 Sphere  +2.50   Left -0.50 +0.50 090 +2.50           IMAGING AND PROCEDURES  Imaging and Procedures for 11/20/2022  OCT, Retina - OU - Both Eyes       Right Eye Quality was good. Central Foveal Thickness: 248. Progression has been stable. Findings include normal foveal contour, no IRF, no SRF (Rare drusen).   Left Eye Quality was good. Central Foveal Thickness: 237. Progression has improved. Findings include normal foveal contour, no SRF, intraretinal hyper-reflective material, intraretinal fluid, outer retinal atrophy (interval improvement in central cystic changes / IRF, patchy ellipsoid signal centrally).   Notes *Images captured and stored on drive  Diagnosis / Impression:  OD: NFP; no IRF/SRF OS: interval improvement in central cystic changes / IRF, patchy ellipsoid signal centrally  Clinical management:  See below  Abbreviations: NFP - Normal foveal profile. CME - cystoid macular edema. PED - pigment epithelial detachment. IRF - intraretinal fluid. SRF - subretinal fluid. EZ - ellipsoid zone. ERM - epiretinal membrane. ORA - outer retinal atrophy. ORT -  outer retinal tubulation. SRHM - subretinal hyper-reflective material. IRHM - intraretinal hyper-reflective material      Intravitreal Injection, Pharmacologic Agent - OS - Left Eye       Time Out 11/20/2022. 10:23 AM. Confirmed correct patient, procedure, site, and patient consented.   Anesthesia Topical anesthesia was used. Anesthetic medications included Lidocaine 2%, Proparacaine 0.5%.   Procedure Preparation included 5% betadine to ocular surface, eyelid speculum. A (32g) needle was used.   Injection: 2 mg aflibercept 2 MG/0.05ML   Route: Intravitreal, Site: Left Eye   NDC: [id number], Lot: [id number], Expiration date: 08/22/2023, Waste: 0 mL   Post-op Post injection exam found visual acuity of at least counting fingers. The patient tolerated the procedure well. There were no complications. The patient received written and verbal post procedure care education.            ASSESSMENT/PLAN:    ICD-10-CM   1. Both eyes affected by mild nonproliferative diabetic retinopathy with macular edema, associated with type 2 diabetes mellitus (HCC)  [id number] OCT, Retina - OU - Both Eyes    Intravitreal Injection, Pharmacologic Agent - OS - Left Eye    aflibercept (EYLEA) SOLN 2 mg    2. Intermediate stage nonexudative age-related macular degeneration of both eyes  H35.3132     3. Essential hypertension  I10     4. Hypertensive retinopathy of both eyes  H35.033     5. Pseudophakia, both eyes  Z96.1      Mild Non-proliferative diabetic retinopathy, both eyes - last A1c  6.8 on 3.17.23 from 8.2 on 10.20.22 - IVA OS #1 (02.21.23), #2 (03.21.23), #3 (04.18.22), #4 (05.16.23), #5 (06.13.23) -- IVA resistance - s/p IVE OS #1 (07.17.23), #2 (08.14.23), #3 (09.11.23), #4 (10.10.23), #5 (11.07.23), #6 (12.05.23), #7 (01.04.24), #8 (02.01.24) - OD: without DME - OS: +DME - exam shows rare MA OU - unable to obtain FA -- poor IV access - BCVA OS 20/100 from 20/60 - OCT shows interval  improvement in central cystic changes / IRF, patchy ellipsoid signal centrally - recommend IVE OS #9 today, 02.29.24 w/ f/u in 4-5 wks - pt wishes to proceed with injection - RBA of procedure discussed, questions answered - IVE informed consent obtained and signed for IVE OS on 07.17.23 - see procedure note - f/u in 4-5 wks -- DFE/OCT, possible injection  2. Age related macular degeneration, non-exudative, OU  - intermediate stage with mild midzonal drusen - The incidence, anatomy, and pathology of dry AMD, risk of progression, and the AREDS and AREDS 2 studies including smoking risks discussed with patient. - continue Amsler grid monitoring  3,4. Hypertensive retinopathy OU - discussed importance of tight BP control - monitor  5. Pseudophakia OU  - s/p CE/IOL OU  - 3-piece IOLs in good position, doing well  - monitor  Ophthalmic Meds Ordered this visit:  Meds ordered this encounter  Medications   aflibercept (EYLEA) SOLN 2 mg     Return in about 5 weeks (around 12/25/2022) for f/u Mild NPDR OU , DFE, OCT, Possible, IVE, OS.  There are no Patient Instructions on file for this visit.   Explained the diagnoses, plan, and follow up with the patient and they expressed understanding.  Patient expressed understanding of the importance of proper follow up care.   This document serves as a record of services personally performed by Gardiner Sleeper, MD, PhD. It was created on their behalf by San Jetty. Owens Shark, OA an ophthalmic technician. The creation of this record is the provider's dictation and/or activities during the visit.    Electronically signed by: San Jetty. Owens Shark, [place] 02.16.2024 11:26 AM  This document serves as a record of services personally performed by Gardiner Sleeper, MD, PhD. It was created on their behalf by Renaldo Reel, [place] an ophthalmic technician. The creation of this record is the provider's dictation and/or activities during the visit.    Electronically signed  by:  Renaldo Reel, COT  02.29.24 11:26 AM   Gardiner Sleeper, M.D., Ph.D. Diseases & Surgery of the Retina and Vitreous [facility]  I have reviewed the above documentation for accuracy and completeness, and I agree with the above. Gardiner Sleeper, M.D., Ph.D. 11/20/22 11:27 AM   Abbreviations: M myopia (nearsighted); A astigmatism; H hyperopia (farsighted); P presbyopia; Mrx spectacle prescription;  CTL contact lenses; OD right eye; OS left eye; OU both eyes  XT exotropia; ET esotropia; PEK punctate epithelial keratitis; PEE punctate epithelial erosions; DES dry eye syndrome; MGD meibomian gland dysfunction; ATs artificial tears; PFAT's preservative free artificial tears; [address] nuclear sclerotic cataract; PSC posterior subcapsular cataract; ERM epi-retinal membrane; PVD posterior vitreous detachment; RD retinal detachment; DM diabetes mellitus; DR diabetic retinopathy; NPDR non-proliferative diabetic retinopathy; PDR proliferative diabetic retinopathy; CSME clinically significant macular edema; DME diabetic macular edema; dbh dot blot hemorrhages; CWS cotton wool spot; POAG primary open angle glaucoma; C/D cup-to-disc ratio; HVF humphrey visual field; GVF goldmann visual field; OCT optical coherence tomography; IOP intraocular pressure; BRVO Branch retinal vein occlusion; CRVO  central retinal vein occlusion; CRAO central retinal artery occlusion; BRAO branch retinal artery occlusion; RT retinal tear; SB scleral buckle; PPV pars plana vitrectomy; VH Vitreous hemorrhage; PRP panretinal laser photocoagulation; IVK intravitreal kenalog; VMT vitreomacular traction; MH Macular hole;  NVD neovascularization of the disc; NVE neovascularization elsewhere; AREDS age related eye disease study; ARMD age related macular degeneration; POAG primary open angle glaucoma; EBMD epithelial/anterior basement membrane dystrophy; ACIOL anterior chamber intraocular lens; IOL intraocular lens; PCIOL  posterior chamber intraocular lens; Phaco/IOL phacoemulsification with intraocular lens placement; [place] photorefractive keratectomy; LASIK laser assisted in situ keratomileusis; HTN hypertension; DM diabetes mellitus; COPD chronic obstructive pulmonary disease

## 2022-11-20 ENCOUNTER — Encounter (INDEPENDENT_AMBULATORY_CARE_PROVIDER_SITE_OTHER): Payer: Self-pay | Admitting: Ophthalmology

## 2022-11-20 ENCOUNTER — Ambulatory Visit (INDEPENDENT_AMBULATORY_CARE_PROVIDER_SITE_OTHER): Payer: Medicare Other | Admitting: Ophthalmology

## 2022-11-20 ENCOUNTER — Encounter (INDEPENDENT_AMBULATORY_CARE_PROVIDER_SITE_OTHER): Payer: Medicare Other | Admitting: Ophthalmology

## 2022-11-20 DIAGNOSIS — H353132 Nonexudative age-related macular degeneration, bilateral, intermediate dry stage: Secondary | ICD-10-CM | POA: Diagnosis not present

## 2022-11-20 DIAGNOSIS — E113213 Type 2 diabetes mellitus with mild nonproliferative diabetic retinopathy with macular edema, bilateral: Secondary | ICD-10-CM

## 2022-11-20 DIAGNOSIS — Z961 Presence of intraocular lens: Secondary | ICD-10-CM

## 2022-11-20 DIAGNOSIS — I10 Essential (primary) hypertension: Secondary | ICD-10-CM

## 2022-11-20 DIAGNOSIS — H35033 Hypertensive retinopathy, bilateral: Secondary | ICD-10-CM

## 2022-11-20 MED ORDER — AFLIBERCEPT 2MG/0.05ML IZ SOLN FOR KALEIDOSCOPE
2.0000 mg | INTRAVITREAL | Status: AC | PRN
  Administered 2022-11-20: 2 mg via INTRAVITREAL

## 2022-12-11 ENCOUNTER — Ambulatory Visit: Payer: Medicare Other | Admitting: Family Medicine

## 2022-12-11 ENCOUNTER — Encounter: Payer: Self-pay | Admitting: Family Medicine

## 2022-12-11 VITALS — BP 132/62 | HR 70 | Temp 98.4°F | Ht 63.0 in | Wt 155.2 lb

## 2022-12-11 DIAGNOSIS — I10 Essential (primary) hypertension: Secondary | ICD-10-CM | POA: Diagnosis not present

## 2022-12-11 DIAGNOSIS — E1140 Type 2 diabetes mellitus with diabetic neuropathy, unspecified: Secondary | ICD-10-CM

## 2022-12-11 DIAGNOSIS — Z794 Long term (current) use of insulin: Secondary | ICD-10-CM | POA: Diagnosis not present

## 2022-12-11 DIAGNOSIS — E113213 Type 2 diabetes mellitus with mild nonproliferative diabetic retinopathy with macular edema, bilateral: Secondary | ICD-10-CM | POA: Diagnosis not present

## 2022-12-11 DIAGNOSIS — G309 Alzheimer's disease, unspecified: Secondary | ICD-10-CM

## 2022-12-11 DIAGNOSIS — F0280 Dementia in other diseases classified elsewhere without behavioral disturbance: Secondary | ICD-10-CM

## 2022-12-11 LAB — POCT GLYCOSYLATED HEMOGLOBIN (HGB A1C): Hemoglobin A1C: 7.5 % — AB (ref 4.0–5.6)

## 2022-12-11 MED ORDER — NOVOLOG FLEXPEN 100 UNIT/ML ~~LOC~~ SOPN: 14.0000 [IU] | PEN_INJECTOR | Freq: Two times a day (BID) | SUBCUTANEOUS | 11 refills | Status: DC

## 2022-12-11 MED ORDER — LANTUS SOLOSTAR 100 UNIT/ML ~~LOC~~ SOPN: 20.0000 [IU] | PEN_INJECTOR | Freq: Every day | SUBCUTANEOUS | 4 refills | Status: DC

## 2022-12-11 NOTE — Progress Notes (Signed)
Established Patient Office Visit   Subjective  Patient ID: Karen Powell, female    DOB: 05/05/1936  Age: 87 y.o. MRN: [id number]  No chief complaint on file.   Pt is an 87 yo female with pmh sig for  has a past medical history of Arthritis, Depression, Diabetic neuropathy ([place]), aortic dilation, DM 2, HLD, HTN, migraines, Palpitations, and Urinary incontinence.  Patient has a log of blood sugars.  Lowest 58 highest 205.  BS improved since last office visit and is now under 200 with average 150s.  Had recent follow-up with ophthalmology for diabetic retinopathy and hypertensive retinopathy.  Patient endorses compliance with meds.  Patient previously seen by Dr. Tamala Julian, cardiology however he recently retired.  Patient inquires about new cardiology providers.       ROS Negative unless stated above    Objective:     BP (!) 178/86 (BP Location: Left Arm, Patient Position: Sitting, Cuff Size: Normal)   Pulse 70   Temp 98.4 F (36.9 C) (Oral)   Ht 5\' 3"  (1.6 m)   Wt 155 lb 3.2 oz (70.4 kg)   SpO2 (!) 7%   BMI 27.49 kg/m    Physical Exam Constitutional:      General: She is not in acute distress.    Appearance: Normal appearance.  HENT:     Head: Normocephalic and atraumatic.     Nose: Nose normal.     Mouth/Throat:     Mouth: Mucous membranes are moist.  Eyes:     Extraocular Movements: Extraocular movements intact.     Conjunctiva/sclera: Conjunctivae normal.     Pupils: Pupils are equal, round, and reactive to light.  Cardiovascular:     Rate and Rhythm: Normal rate and regular rhythm.     Heart sounds: Normal heart sounds. No murmur heard.    No gallop.  Pulmonary:     Effort: Pulmonary effort is normal. No respiratory distress.     Breath sounds: Normal breath sounds. No wheezing, rhonchi or rales.  Skin:    General: Skin is warm and dry.  Neurological:     Mental Status: She is alert and oriented to person, place, and time. Mental status is at baseline.       Results for orders placed or performed in visit on 12/11/22  POC HgB A1c  Result Value Ref Range   Hemoglobin A1C 7.5 (A) 4.0 - 5.6 %   HbA1c POC (<> result, manual entry)     HbA1c, POC (prediabetic range)     HbA1c, POC (controlled diabetic range)        Assessment & Plan:  Type 2 diabetes mellitus with diabetic neuropathy, with long-term current use of insulin (HCC) -Hemoglobin A1c 7.8% on 07/31/2022 -Hemoglobin A1c 7.5% this visit -Continue current medications including Farxiga 5 mg in a.m., metformin 500 mg twice daily, NovoLog 14 units twice daily, and Lantus 20 units -Med list updated -Continue statin and ARB -diabetic retinopathy noted.  Continue follow-up with ophthalmology -     POCT glycosylated hemoglobin (Hb A1C)  Essential hypertension -Uncontrolled -Discussed importance of compliance -Continue current medications: Toprol-XL 50 mg in am and 25 mg in pm, losartan-hydrochlorothiazide 100-25 mg daily -Continue follow-up cardiology  Alzheimer disease ([place]) -stable -Declines medication. -Family involvement encouraged however patient adamantly declines. -Try to minimize med changes and write down instructions to prevent confusion.  Both eyes affected by mild nonproliferative diabetic retinopathy with macular edema, associated with type 2 diabetes mellitus ([place]) -Continue follow-up with ophthalmology  On day of service, 36 minutes spent caring for this patient face-to-face, reviewing the chart, counseling and/or coordinating care for plan and treatment of diagnosis below.     Return in about 4 months (around 04/12/2023).   Billie Ruddy, MD

## 2022-12-11 NOTE — Patient Instructions (Signed)
Your hemoglobin A1c was 7.5% this visit.

## 2022-12-11 NOTE — Progress Notes (Signed)
[facility] Note  12/23/2022     CHIEF COMPLAINT Patient presents for Retina Follow Up   HISTORY OF PRESENT ILLNESS: Karen Powell is a 87 y.o. female who presents to the clinic today for:   HPI     Retina Follow Up   Patient presents with  Diabetic Retinopathy.  In both eyes.  This started 5 weeks ago.  I, the attending physician,  performed the HPI with the patient and updated documentation appropriately.        Comments   Patient here for 5 weeks retina follow up for NPDR OU. Patient states vision doing the same. No eye pain.      Last edited by Bernarda Caffey, MD on 12/23/2022 12:16 PM.     Referring physician: Lisabeth Pick, MD [place],  [place] [address]  HISTORICAL INFORMATION:   Selected notes from the MEDICAL RECORD NUMBER Referred by Dr. Lucianne Lei for mac edema LEE: 07/04/21 BCVA OD: 20/40 OS: 20/100 Ocular Hx- amblyopia, macular edema OS PMH- DM, HTN, migraines, high cholesterol; last a1c was 8.2 on 07/11/21    CURRENT MEDICATIONS: Current Outpatient Medications (Ophthalmic Drugs)  Medication Sig   Polyethyl Glycol-Propyl Glycol (SYSTANE ULTRA OP) Apply 1 drop to eye daily.   No current facility-administered medications for this visit. (Ophthalmic Drugs)   Current Outpatient Medications (Other)  Medication Sig   Alcohol Swabs ([employer] PREP) 70 % PADS USE FOR CHECKING BLOOD SUGAR AT LEAST 3 TIMES DAILY   Apoaequorin (PREVAGEN PO) Take 1 capsule by mouth daily.   aspirin EC 81 MG tablet Take 1 tablet (81 mg total) by mouth daily.   BD PEN NEEDLE MICRO U/F 32G X 6 MM MISC USE TO ADMINISTER INSULIN THREE TIMES DAILY   Cetirizine HCl (ZYRTEC ALLERGY PO) Take 1 tablet by mouth daily.   Coenzyme Q10 (COQ10) 200 MG CAPS Take 1 capsule by mouth daily.   Continuous Blood Gluc Sensor (FREESTYLE LIBRE 2 SENSOR) MISC Use to check blood sugars.   dapagliflozin propanediol (FARXIGA) 5 MG TABS tablet Take 1 tablet (5 mg total) by  mouth daily before breakfast.   esomeprazole (NEXIUM) 20 MG capsule Take 20 mg by mouth daily at 12 noon.   insulin aspart (NOVOLOG FLEXPEN) 100 UNIT/ML FlexPen Inject 14 Units into the skin 2 (two) times daily with a meal.   insulin glargine (LANTUS SOLOSTAR) 100 UNIT/ML Solostar Pen Inject 20 Units into the skin at bedtime.   losartan-hydrochlorothiazide (HYZAAR) 100-25 MG tablet TAKE 1 TABLET BY MOUTH EVERY DAY   metFORMIN (GLUCOPHAGE) 500 MG tablet TAKE 1 TABLET BY MOUTH TWICE A DAY   Multiple Vitamins-Minerals (CENTRUM PO) Take 1 tablet by mouth daily.   Naproxen Sodium (ALEVE PO) Take 2 tablets by mouth daily as needed (pain).    Omega-3 Fatty Acids (OMEGA 3 PO) Take 1 capsule by mouth daily.   Probiotic Product (PROBIOTIC PO) Take 1 capsule by mouth at bedtime.   rosuvastatin (CRESTOR) 20 MG tablet TAKE 1 TABLET BY MOUTH EVERYDAY AT BEDTIME   Sennosides-Docusate Sodium (SENOKOT S PO) Take 1 tablet by mouth 2 (two) times daily.   TOPROL XL 25 MG 24 hr tablet TAKE 2 TABLETS (50 MG) IN THE MORNING AND 1 TABLET (25 MG) IN THE EVENING   Turmeric 500 MG TABS Take 1 tablet by mouth daily.   ZOLOFT 25 MG tablet Take 1 tablet (25 mg total) by mouth daily.   No current facility-administered medications for this  visit. (Other)   REVIEW OF SYSTEMS: ROS   Positive for: Endocrine, Cardiovascular, Eyes Negative for: Constitutional, Gastrointestinal, Neurological, Skin, Genitourinary, Musculoskeletal, HENT, Respiratory, Psychiatric, Allergic/Imm, Heme/Lymph Last edited by Theodore Demark, COA on 12/23/2022  9:32 AM.      ALLERGIES Allergies  Allergen Reactions   Penicillins Anaphylaxis   PAST MEDICAL HISTORY Past Medical History:  Diagnosis Date   Arthritis    Blood transfusion without reported diagnosis    Depression    Diabetic neuropathy    Dizziness    Frequent headaches    Hyperlipidemia    Hypertension    Migraines    Palpitations    Urinary incontinence    Past Surgical  History:  Procedure Laterality Date   none     FAMILY HISTORY Family History  Problem Relation Age of Onset   Diabetes Mother    Hypertension Mother    Diabetes Maternal Grandmother    Diabetes Maternal Grandfather    SOCIAL HISTORY Social History   Tobacco Use   Smoking status: Never   Smokeless tobacco: Never  Vaping Use   Vaping Use: Never used  Substance Use Topics   Alcohol use: Yes   Drug use: No       OPHTHALMIC EXAM:  Base Eye Exam     Visual Acuity (Snellen - Linear)       Right Left   Dist cc 20/40 20/100   Dist ph cc 20/25 -1 20/80    Correction: Glasses         Tonometry (Tonopen, 9:30 AM)       Right Left   Pressure 12 10         Pupils       Dark Light Shape React APD   Right 3 2 Round Brisk None   Left 3 2 Round Brisk None         Visual Fields (Counting fingers)       Left Right    Full Full         Extraocular Movement       Right Left    Full, Ortho Full, Ortho         Neuro/Psych     Oriented x3: Yes   Mood/Affect: Normal         Dilation     Both eyes: 1.0% Mydriacyl, 2.5% Phenylephrine @ 9:30 AM           Slit Lamp and Fundus Exam     Slit Lamp Exam       Right Left   Lids/Lashes Dermatochalasis - upper lid Dermatochalasis - upper lid   Conjunctiva/Sclera nasal and temporal pinguecula, mild melanosis nasal and temporal pinguecula, mild melanosis   Cornea 2-3+ Punctate epithelial erosions, mild arcus trace Punctate epithelial erosions, mild arcus, mild central corneal haze   Anterior Chamber Deep and quiet Deep and quiet   Iris Round and dilated Round and dilated   Lens 3 piece PC IOL, open PC 3 piece PC IOL, open PC   Anterior Vitreous Vitreous syneresis, Posterior vitreous detachment, mild Asteroid hyalosis inferiorly Vitreous syneresis, Posterior vitreous detachment         Fundus Exam       Right Left   Disc Pink and Sharp, mild PPA/PPP Pink and Sharp, mild temporal PPA   C/D Ratio  0.3 0.3   Macula Flat, Good foveal reflex, rare MA, cluster of drusen ST mac, RPE mottling good foveal reflex, cluster of drusen ST mac, central  cystic changes - slightly improved, +MA, pigmented CR scarring, mild ERM temporal macula   Vessels attenuated, Tortuous attenuated, Tortuous   Periphery Attached, mild midzonal drusen, rare MA Attached, mild midzonal drusen, mild reticular degeneration, rare MA           Refraction     Wearing Rx       Sphere Cylinder Axis Add   Right -0.50 Sphere  +2.50   Left -0.50 +0.50 090 +2.50           IMAGING AND PROCEDURES  Imaging and Procedures for 12/23/2022  OCT, Retina - OU - Both Eyes       Right Eye Quality was good. Central Foveal Thickness: 253. Progression has been stable. Findings include normal foveal contour, no IRF, no SRF, retinal drusen (Rare drusen).   Left Eye Quality was good. Central Foveal Thickness: 239. Progression has improved. Findings include normal foveal contour, no SRF, intraretinal hyper-reflective material, intraretinal fluid, outer retinal atrophy (Mild interval improvement in central cystic changes / IRF and IRHM, patchy ellipsoid signal centrally).   Notes *Images captured and stored on drive  Diagnosis / Impression:  OD: NFP; no IRF/SRF OS: Mild interval improvement in central cystic changes / IRF and IRHM, patchy ellipsoid signal centrally  Clinical management:  See below  Abbreviations: NFP - Normal foveal profile. CME - cystoid macular edema. PED - pigment epithelial detachment. IRF - intraretinal fluid. SRF - subretinal fluid. EZ - ellipsoid zone. ERM - epiretinal membrane. ORA - outer retinal atrophy. ORT - outer retinal tubulation. SRHM - subretinal hyper-reflective material. IRHM - intraretinal hyper-reflective material      Intravitreal Injection, Pharmacologic Agent - OS - Left Eye       Time Out 12/23/2022. 10:21 AM. Confirmed correct patient, procedure, site, and patient consented.    Anesthesia Topical anesthesia was used. Anesthetic medications included Lidocaine 2%, Proparacaine 0.5%.   Procedure Preparation included 5% betadine to ocular surface, eyelid speculum. A (32g) needle was used.   Injection: 2 mg aflibercept 2 MG/0.05ML   Route: Intravitreal, Site: Left Eye   NDC: [id number], Lot: [id number], Expiration date: 12/21/2023, Waste: 0 mL   Post-op Post injection exam found visual acuity of at least counting fingers. The patient tolerated the procedure well. There were no complications. The patient received written and verbal post procedure care education.            ASSESSMENT/PLAN:    ICD-10-CM   1. Both eyes affected by mild nonproliferative diabetic retinopathy with macular edema, associated with type 2 diabetes mellitus  E11.3213 OCT, Retina - OU - Both Eyes    Intravitreal Injection, Pharmacologic Agent - OS - Left Eye    aflibercept (EYLEA) SOLN 2 mg    2. Intermediate stage nonexudative age-related macular degeneration of both eyes  H35.3132     3. Essential hypertension  I10     4. Hypertensive retinopathy of both eyes  H35.033     5. Pseudophakia, both eyes  Z96.1       Mild Non-proliferative diabetic retinopathy, both eyes - OD: without DME - OS: +DME - last A1c 6.8 on 3.17.23 from 8.2 on 10.20.22 - s/p IVA OS #1 (02.21.23), #2 (03.21.23), #3 (04.18.22), #4 (05.16.23), #5 (06.13.23) -- IVA resistance - s/p IVE OS #1 (07.17.23), #2 (08.14.23), #3 (09.11.23), #4 (10.10.23), #5 (11.07.23), #6 (12.05.23), #7 (01.04.24), #8 (02.01.24), #9 (02.29.24) - exam shows rare MA OU - unable to obtain FA -- poor IV access - BCVA OS 20/80 from  20/100 - OCT shows interval improvement in central cystic changes / IRF, patchy ellipsoid signal centrally at 4.5 wks - recommend IVE OS #10 today, 04.02.24 w/ f/u in 5 wks - pt wishes to proceed with injection - RBA of procedure discussed, questions answered - IVE informed consent obtained and signed  for IVE OS on 07.17.23 - see procedure note - f/u in 5 wks -- DFE/OCT, possible injection  2. Age related macular degeneration, non-exudative, OU  - intermediate stage with mild midzonal drusen - The incidence, anatomy, and pathology of dry AMD, risk of progression, and the AREDS and AREDS 2 studies including smoking risks discussed with patient. - continue Amsler grid monitoring  3,4. Hypertensive retinopathy OU - discussed importance of tight BP control - monitor  5. Pseudophakia OU  - s/p CE/IOL OU  - 3-piece IOLs in good position, doing well  - monitor  Ophthalmic Meds Ordered this visit:  Meds ordered this encounter  Medications   aflibercept (EYLEA) SOLN 2 mg     Return in about 5 weeks (around 01/27/2023) for NPDR OU, DFE, OCT, likely injction OS.  There are no Patient Instructions on file for this visit.   Explained the diagnoses, plan, and follow up with the patient and they expressed understanding.  Patient expressed understanding of the importance of proper follow up care.    This document serves as a record of services personally performed by Gardiner Sleeper, MD, PhD. It was created on their behalf by Renaldo Reel, [place] an ophthalmic technician. The creation of this record is the provider's dictation and/or activities during the visit.    Electronically signed by:  Renaldo Reel, COT  03.21.24 12:17 PM   This document serves as a record of services personally performed by Gardiner Sleeper, MD, PhD. It was created on their behalf by Orvan Falconer, an ophthalmic technician. The creation of this record is the provider's dictation and/or activities during the visit.    Electronically signed by: Orvan Falconer, OA, 12/23/22  12:17 PM   Gardiner Sleeper, M.D., Ph.D. Diseases & Surgery of the Retina and Vitreous [facility]  I have reviewed the above documentation for accuracy and completeness, and I agree with the above. Gardiner Sleeper, M.D., Ph.D. 12/23/22 12:22 PM   Abbreviations: M myopia (nearsighted); A astigmatism; H hyperopia (farsighted); P presbyopia; Mrx spectacle prescription;  CTL contact lenses; OD right eye; OS left eye; OU both eyes  XT exotropia; ET esotropia; PEK punctate epithelial keratitis; PEE punctate epithelial erosions; DES dry eye syndrome; MGD meibomian gland dysfunction; ATs artificial tears; PFAT's preservative free artificial tears; [place] nuclear sclerotic cataract; PSC posterior subcapsular cataract; ERM epi-retinal membrane; PVD posterior vitreous detachment; RD retinal detachment; DM diabetes mellitus; DR diabetic retinopathy; NPDR non-proliferative diabetic retinopathy; PDR proliferative diabetic retinopathy; CSME clinically significant macular edema; DME diabetic macular edema; dbh dot blot hemorrhages; CWS cotton wool spot; POAG primary open angle glaucoma; C/D cup-to-disc ratio; HVF humphrey visual field; GVF goldmann visual field; OCT optical coherence tomography; IOP intraocular pressure; BRVO Branch retinal vein occlusion; CRVO central retinal vein occlusion; CRAO central retinal artery occlusion; BRAO branch retinal artery occlusion; RT retinal tear; SB scleral buckle; PPV pars plana vitrectomy; VH Vitreous hemorrhage; PRP panretinal laser photocoagulation; IVK intravitreal kenalog; VMT vitreomacular traction; MH Macular hole;  NVD neovascularization of the disc; NVE neovascularization elsewhere; AREDS age related eye disease study; ARMD age related macular degeneration; POAG primary open angle glaucoma; EBMD epithelial/anterior basement membrane dystrophy;  ACIOL anterior chamber intraocular lens; IOL intraocular lens; PCIOL posterior chamber intraocular lens; Phaco/IOL phacoemulsification with intraocular lens placement; [place] photorefractive keratectomy; LASIK laser assisted in situ keratomileusis; HTN hypertension; DM diabetes mellitus; COPD chronic obstructive pulmonary disease

## 2022-12-23 ENCOUNTER — Ambulatory Visit (INDEPENDENT_AMBULATORY_CARE_PROVIDER_SITE_OTHER): Payer: Medicare Other | Admitting: Ophthalmology

## 2022-12-23 ENCOUNTER — Encounter (INDEPENDENT_AMBULATORY_CARE_PROVIDER_SITE_OTHER): Payer: Self-pay | Admitting: Ophthalmology

## 2022-12-23 DIAGNOSIS — I10 Essential (primary) hypertension: Secondary | ICD-10-CM

## 2022-12-23 DIAGNOSIS — E113213 Type 2 diabetes mellitus with mild nonproliferative diabetic retinopathy with macular edema, bilateral: Secondary | ICD-10-CM

## 2022-12-23 DIAGNOSIS — H35033 Hypertensive retinopathy, bilateral: Secondary | ICD-10-CM

## 2022-12-23 DIAGNOSIS — Z961 Presence of intraocular lens: Secondary | ICD-10-CM | POA: Diagnosis not present

## 2022-12-23 DIAGNOSIS — H353132 Nonexudative age-related macular degeneration, bilateral, intermediate dry stage: Secondary | ICD-10-CM | POA: Diagnosis not present

## 2022-12-23 MED ORDER — AFLIBERCEPT 2MG/0.05ML IZ SOLN FOR KALEIDOSCOPE
2.0000 mg | INTRAVITREAL | Status: AC | PRN
  Administered 2022-12-23: 2 mg via INTRAVITREAL

## 2023-01-02 ENCOUNTER — Telehealth: Payer: Self-pay | Admitting: Family Medicine

## 2023-01-02 DIAGNOSIS — E1140 Type 2 diabetes mellitus with diabetic neuropathy, unspecified: Secondary | ICD-10-CM

## 2023-01-02 MED ORDER — FREESTYLE LIBRE 2 SENSOR MISC
11 refills | Status: DC
Start: 2023-01-02 — End: 2023-01-02

## 2023-01-02 MED ORDER — FREESTYLE LIBRE 2 SENSOR MISC
11 refills | Status: AC
Start: 2023-01-02 — End: ?

## 2023-01-02 NOTE — Telephone Encounter (Signed)
Prescription Request  01/02/2023  LOV: 12/11/2022  What is the name of the medication or equipment? Continuous Blood Gluc Sensor (FREESTYLE LIBRE 2 SENSOR) MISC pt states she has been out for 3 days  Have you contacted your pharmacy to request a refill? Yes   Which pharmacy would you like this sent to?    [facility] - [place], [place] - [address]    Patient notified that their request is being sent to the clinical staff for review and that they should receive a response within 2 business days.   Please advise at Mobile [phone] (mobile)

## 2023-01-02 NOTE — Addendum Note (Signed)
Addended by: Christy Sartorius on: 01/02/2023 10:21 AM   Modules accepted: Orders

## 2023-01-02 NOTE — Telephone Encounter (Signed)
Rx sent 

## 2023-01-02 NOTE — Addendum Note (Signed)
Addended by: Christy Sartorius on: 01/02/2023 10:22 AM   Modules accepted: Orders

## 2023-01-07 ENCOUNTER — Telehealth: Payer: Self-pay | Admitting: Family Medicine

## 2023-01-07 NOTE — Telephone Encounter (Signed)
Contacted Karen Powell to schedule their annual wellness visit. Patient declined to schedule AWV at this time.  Karen Powell AWV direct phone # [phone]   Spoke with patient to schedule her awv.  I offered phone or in office .  Patient stated she has never done this before and was upset her meds prices went up.  I tried to explain to patient there may be program that would help her.  She stated she didn't want to talk about any damn programs.

## 2023-01-13 ENCOUNTER — Telehealth: Payer: Self-pay | Admitting: Family Medicine

## 2023-01-13 NOTE — Telephone Encounter (Signed)
12/11/22 addendum stating patient is using CGM with doctor's pls fax [phone]

## 2023-01-15 NOTE — Telephone Encounter (Signed)
Calling to check on progress of this request 

## 2023-01-20 NOTE — Telephone Encounter (Signed)
Information faxed, confirmation received. Pt is not using a CGM, pt wants CGM but it is not covered.

## 2023-01-27 ENCOUNTER — Encounter (INDEPENDENT_AMBULATORY_CARE_PROVIDER_SITE_OTHER): Payer: Medicare Other | Admitting: Ophthalmology

## 2023-01-27 DIAGNOSIS — E113213 Type 2 diabetes mellitus with mild nonproliferative diabetic retinopathy with macular edema, bilateral: Secondary | ICD-10-CM

## 2023-01-27 DIAGNOSIS — Z961 Presence of intraocular lens: Secondary | ICD-10-CM

## 2023-01-27 DIAGNOSIS — I10 Essential (primary) hypertension: Secondary | ICD-10-CM

## 2023-01-27 DIAGNOSIS — H35033 Hypertensive retinopathy, bilateral: Secondary | ICD-10-CM

## 2023-01-27 DIAGNOSIS — H353132 Nonexudative age-related macular degeneration, bilateral, intermediate dry stage: Secondary | ICD-10-CM

## 2023-01-28 ENCOUNTER — Other Ambulatory Visit: Payer: Self-pay | Admitting: Family Medicine

## 2023-01-28 DIAGNOSIS — E1140 Type 2 diabetes mellitus with diabetic neuropathy, unspecified: Secondary | ICD-10-CM

## 2023-03-25 ENCOUNTER — Other Ambulatory Visit: Payer: Self-pay | Admitting: Family Medicine

## 2023-03-25 DIAGNOSIS — E1140 Type 2 diabetes mellitus with diabetic neuropathy, unspecified: Secondary | ICD-10-CM

## 2023-04-09 ENCOUNTER — Ambulatory Visit: Payer: Medicare Other | Admitting: Family Medicine

## 2023-04-09 ENCOUNTER — Encounter: Payer: Self-pay | Admitting: Family Medicine

## 2023-04-09 VITALS — BP 148/88 | HR 65 | Temp 98.7°F | Wt 160.4 lb

## 2023-04-09 DIAGNOSIS — Z794 Long term (current) use of insulin: Secondary | ICD-10-CM | POA: Diagnosis not present

## 2023-04-09 DIAGNOSIS — F0280 Dementia in other diseases classified elsewhere without behavioral disturbance: Secondary | ICD-10-CM

## 2023-04-09 DIAGNOSIS — E113213 Type 2 diabetes mellitus with mild nonproliferative diabetic retinopathy with macular edema, bilateral: Secondary | ICD-10-CM | POA: Diagnosis not present

## 2023-04-09 DIAGNOSIS — L309 Dermatitis, unspecified: Secondary | ICD-10-CM | POA: Diagnosis not present

## 2023-04-09 DIAGNOSIS — E1140 Type 2 diabetes mellitus with diabetic neuropathy, unspecified: Secondary | ICD-10-CM

## 2023-04-09 DIAGNOSIS — G309 Alzheimer's disease, unspecified: Secondary | ICD-10-CM | POA: Diagnosis not present

## 2023-04-09 DIAGNOSIS — I10 Essential (primary) hypertension: Secondary | ICD-10-CM

## 2023-04-09 LAB — CBC WITH DIFFERENTIAL/PLATELET
Basophils Absolute: 0 10*3/uL (ref 0.0–0.1)
Basophils Relative: 0.7 % (ref 0.0–3.0)
Eosinophils Absolute: 0.1 10*3/uL (ref 0.0–0.7)
Eosinophils Relative: 2.5 % (ref 0.0–5.0)
HCT: 40.7 % (ref 36.0–46.0)
Hemoglobin: 13.3 g/dL (ref 12.0–15.0)
Lymphocytes Relative: 31.1 % (ref 12.0–46.0)
Lymphs Abs: 1.7 10*3/uL (ref 0.7–4.0)
MCHC: 32.8 g/dL (ref 30.0–36.0)
MCV: 90.2 fl (ref 78.0–100.0)
Monocytes Absolute: 0.5 10*3/uL (ref 0.1–1.0)
Monocytes Relative: 9 % (ref 3.0–12.0)
Neutro Abs: 3.1 10*3/uL (ref 1.4–7.7)
Neutrophils Relative %: 56.7 % (ref 43.0–77.0)
Platelets: 195 10*3/uL (ref 150.0–400.0)
RBC: 4.51 Mil/uL (ref 3.87–5.11)
RDW: 14.4 % (ref 11.5–15.5)
WBC: 5.4 10*3/uL (ref 4.0–10.5)

## 2023-04-09 LAB — BASIC METABOLIC PANEL
BUN: 22 mg/dL (ref 6–23)
CO2: 29 mEq/L (ref 19–32)
Calcium: 11.2 mg/dL — ABNORMAL HIGH (ref 8.4–10.5)
Chloride: 103 mEq/L (ref 96–112)
Creatinine, Ser: 0.89 mg/dL (ref 0.40–1.20)
GFR: 59.37 mL/min — ABNORMAL LOW (ref >60.00)
Glucose, Bld: 167 mg/dL — ABNORMAL HIGH (ref 70–99)
Potassium: 4.2 mEq/L (ref 3.5–5.1)
Sodium: 140 mEq/L (ref 135–145)

## 2023-04-09 LAB — T4, FREE: Free T4: 0.81 ng/dL (ref 0.60–1.60)

## 2023-04-09 LAB — TSH: TSH: 1.11 u[IU]/mL (ref 0.35–5.50)

## 2023-04-09 LAB — HEMOGLOBIN A1C: Hgb A1c MFr Bld: 8.1 % — ABNORMAL HIGH (ref 4.6–6.5)

## 2023-04-09 NOTE — Progress Notes (Signed)
Established Patient Office Visit   Subjective  Patient ID: Karen Powell, female    DOB: 01/19/1936  Age: 87 y.o. MRN: [id number]  Chief Complaint  Patient presents with   Medical Management of Chronic Issues    DM    Patient is an 87 year old female seen for follow-up.  Patient presents to visit alone.  Patient states well but her blood sugar has been elevated.  Patient attributes the elevation due to stress from her family.  Blood sugar readings 80-278.  Several days where blood sugars remain high the entire day patient notes she may have missed her medication.  Patient also notes drinking a small amount of Sherry some nights.  Patient states she has been having difficulty with GCM sensors not working correctly.  Patient mentions continued dark marks on skin and bumps that are pruritic.  Patient endorses scratching.  Using Ambi fade cream on dark areas.  Patient followed by cardiology.    Past Medical History:  Diagnosis Date   Arthritis    Blood transfusion without reported diagnosis    Depression    Diabetic neuropathy (HCC)    Dizziness    Frequent headaches    Hyperlipidemia    Hypertension    Migraines    Palpitations    Urinary incontinence    Past Surgical History:  Procedure Laterality Date   none     Social History   Tobacco Use   Smoking status: Never   Smokeless tobacco: Never  Vaping Use   Vaping status: Never Used  Substance Use Topics   Alcohol use: Yes   Drug use: No   Family History  Problem Relation Age of Onset   Diabetes Mother    Hypertension Mother    Diabetes Maternal Grandmother    Diabetes Maternal Grandfather    Allergies  Allergen Reactions   Penicillins Anaphylaxis      ROS Negative unless stated above    Objective:     BP (!) 148/88 (BP Location: Left Arm, Patient Position: Sitting, Cuff Size: Normal)   Pulse 65   Temp 98.7 F (37.1 C) (Oral)   Wt 160 lb 6.4 oz (72.8 kg)   SpO2 98%   BMI 28.41 kg/m  BP Readings  from Last 3 Encounters:  04/09/23 (!) 148/88  12/11/22 132/62  07/31/22 138/66   Wt Readings from Last 3 Encounters:  04/09/23 160 lb 6.4 oz (72.8 kg)  12/11/22 155 lb 3.2 oz (70.4 kg)  07/31/22 152 lb 9.6 oz (69.2 kg)      Physical Exam Constitutional:      General: She is not in acute distress.    Appearance: Normal appearance.  HENT:     Head: Normocephalic and atraumatic.     Nose: Nose normal.     Mouth/Throat:     Mouth: Mucous membranes are moist.  Cardiovascular:     Rate and Rhythm: Normal rate and regular rhythm.     Heart sounds: Normal heart sounds. No murmur heard.    No gallop.  Pulmonary:     Effort: Pulmonary effort is normal. No respiratory distress.     Breath sounds: Normal breath sounds. No wheezing, rhonchi or rales.  Skin:    General: Skin is warm and dry.     Findings: Lesion present.     Comments: Hyperpigmentation on all 4 extremities.  LLE with [place] erythematous papules on left lateral ankle.  Several erythematous areas of skin on left LE open without drainage.  L UE  with 3 scattered erythematous papules.  No lesions or burrows between webspaces of fingers.  Neurological:     Mental Status: She is alert and oriented to person, place, and time.      No results found for any visits on 04/09/23.    Assessment & Plan:  Essential hypertension -Elevated but improving -Continue current medications including Toprol-XL 25 mg take 50 mg in a.m. and 20 5 in the evening, losartan-hydrochlorothiazide 100-25 mg daily -     Basic metabolic panel -     CBC with Differential/Platelet -     TSH -     T4, free  Type 2 diabetes mellitus with diabetic neuropathy, with long-term current use of insulin (HCC) -Despite relatively DM per last A1c, patient with frequent hyperglycemia and several episodes of asymptomatic hypoglycemia per blood sugar reading log. -Given patient's history of dementia continue GCM to help keep track of blood sugar. -Continue current  medications including Farxiga 5 mg daily, metformin 500 mg twice daily, Lantus 20 units nightly, NovoLog 14 units twice daily with meals. -Hesitant to make too many adjustments to medications to avoid confusion.  Blood sugar controlled when patient takes medication.  Consider family involvement to help with medication.  HH referral previously placed however patient hesitant to have new people in the home. -Hemoglobin A1c 7.5% on 12/11/2022. -Foot exam due. -Continue statin and ARB -Continue follow-up with ophthalmology for mild nonproliferative diabetic retinopathy and hypertensive retinopathy bilaterally -     CBC with Differential/Platelet -     Hemoglobin A1c  Alzheimer disease (HCC) -Family support encouraged, however patient does not [place]. -Offered medication, patient declines. -Continue to monitor.  Close follow-up.  Both eyes affected by mild nonproliferative diabetic retinopathy with macular edema, associated with type 2 diabetes mellitus (HCC) -Continue follow-up with ophthalmology  Dermatitis -Hyperpigmentation previously noted.  Currently with erythematous papules that were not present during previous visits.  Offered patient referral to dermatology however she declines stating she does not like seeing new people. -Continue supportive care with anti-itch cream or antihistamine such as half tab Allegra for pruritus. -Okay to continue using Ambifed cream.  Also consider vitamin D oil to help with appearance of hyperpigmentation.   Return in about 3 months (around 07/10/2023).   Deeann Saint, MD

## 2023-04-10 ENCOUNTER — Telehealth: Payer: Self-pay | Admitting: Family Medicine

## 2023-04-13 NOTE — Telephone Encounter (Signed)
error 

## 2023-04-15 ENCOUNTER — Other Ambulatory Visit: Payer: Self-pay | Admitting: Family Medicine

## 2023-04-15 NOTE — Telephone Encounter (Signed)
[facility] called to say the documentation for Pt's DM treatment plan was incomplete. They are requesting most recent OV notes, insulin information, updated treatment plan, date of last visit, etc...  Call back:  [phone]  Fax: [phone]

## 2023-04-16 NOTE — Telephone Encounter (Signed)
LOV notes faxed, confirmation received. Office notes have requested information.

## 2023-04-28 ENCOUNTER — Other Ambulatory Visit: Payer: Self-pay | Admitting: Physician Assistant

## 2023-05-11 ENCOUNTER — Telehealth: Payer: Self-pay | Admitting: Family Medicine

## 2023-05-11 ENCOUNTER — Other Ambulatory Visit: Payer: Self-pay | Admitting: Family Medicine

## 2023-05-11 DIAGNOSIS — E1140 Type 2 diabetes mellitus with diabetic neuropathy, unspecified: Secondary | ICD-10-CM

## 2023-05-11 NOTE — Telephone Encounter (Signed)
Pt requesting a call, would not elaborate on why

## 2023-05-12 NOTE — Telephone Encounter (Signed)
Patient called asking that Dr. Salomon Fick refill this medication, I explained to the patient that it was fill by a different provider, patient states that she want like to speak with Dr. Salomon Fick.

## 2023-05-12 NOTE — Addendum Note (Signed)
Addended by: Philipp Deputy A on: 05/12/2023 01:06 PM   Modules accepted: Orders

## 2023-05-15 NOTE — Telephone Encounter (Signed)
Pt is calling  back and want dr.Banks to call her back .

## 2023-05-25 ENCOUNTER — Other Ambulatory Visit: Payer: Self-pay | Admitting: Physician Assistant

## 2023-06-16 ENCOUNTER — Encounter: Payer: Self-pay | Admitting: Family Medicine

## 2023-06-16 ENCOUNTER — Telehealth (INDEPENDENT_AMBULATORY_CARE_PROVIDER_SITE_OTHER): Payer: Medicare Other | Admitting: Family Medicine

## 2023-06-16 VITALS — Ht 63.0 in

## 2023-06-16 DIAGNOSIS — R051 Acute cough: Secondary | ICD-10-CM | POA: Diagnosis not present

## 2023-06-16 DIAGNOSIS — U071 COVID-19: Secondary | ICD-10-CM | POA: Diagnosis not present

## 2023-06-16 MED ORDER — BENZONATATE 100 MG PO CAPS
100.0000 mg | ORAL_CAPSULE | Freq: Two times a day (BID) | ORAL | 0 refills | Status: AC | PRN
Start: 2023-06-16 — End: 2023-06-26

## 2023-06-16 MED ORDER — NIRMATRELVIR/RITONAVIR (PAXLOVID)TABLET
3.0000 | ORAL_TABLET | Freq: Two times a day (BID) | ORAL | 0 refills | Status: AC
Start: 2023-06-16 — End: 2023-06-21

## 2023-06-16 NOTE — Progress Notes (Signed)
Virtual Visit via Video Note I connected with Karen Powell on 06/16/23 by a video enabled telemedicine application and verified that I am speaking with the correct person using two identifiers. Location patient: home Location provider:work office Persons participating in the virtual visit: patient, granddaughter, provider  I discussed the limitations of evaluation and management by telemedicine and the availability of in person appointments. The patient expressed understanding and agreed to proceed.  Chief Complaint  Patient presents with   Covid Positive    Tested positive yesterday, having cough, slight temperature, and no appetite.    HPI: Ms. Karen Powell is an 87 y.o. female with a PMHx significant for HTN, HLD, DM 2, Alzheimer's dementia, and GERD who today is being seen for a positive covid test.   She complains of respiratory symptoms since yesterday. She took a covid test at home and it was positive.  She reports a low-grade fever, rhinorrhea, nasal congestion, postnasal drainage, productive cough,and loss of appetite.  Her daughter reports coughing is worse at night or when she lays down.  She denies hemoptysis. Negative for headache, sore throat, SOB, CP, nausea, vomiting, changes in bowel habits, urinary symptoms, or skin rash. She is not taking anything for her symptoms.   She has been checking blood sugars at home. Readings this morning 135.   She has not been checking BP at home.   She and her granddaughter denies any hx of liver or kidney disease. Last e GFR was slightly abnormal but prior one in normal range.  Lab Results  Component Value Date   NA 140 04/09/2023   CL 103 04/09/2023   K 4.2 04/09/2023   CO2 29 04/09/2023   BUN 22 04/09/2023   CREATININE 0.89 04/09/2023   GFR 59.37 (L) 04/09/2023   CALCIUM 11.2 (H) 04/09/2023   ALBUMIN 4.3 07/31/2022   GLUCOSE 167 (H) 04/09/2023   ROS: See pertinent positives and negatives per HPI.  Past Medical History:   Diagnosis Date   Arthritis    Blood transfusion without reported diagnosis    Depression    Diabetic neuropathy (HCC)    Dizziness    Frequent headaches    Hyperlipidemia    Hypertension    Migraines    Palpitations    Urinary incontinence     Past Surgical History:  Procedure Laterality Date   none      Family History  Problem Relation Age of Onset   Diabetes Mother    Hypertension Mother    Diabetes Maternal Grandmother    Diabetes Maternal Grandfather     Social History   Socioeconomic History   Marital status: Single    Spouse name: Not on file   Number of children: 4   Years of education: 12   Highest education level: Not on file  Occupational History   Occupation: retired  Tobacco Use   Smoking status: Never   Smokeless tobacco: Never  Vaping Use   Vaping status: Never Used  Substance and Sexual Activity   Alcohol use: Yes   Drug use: No   Sexual activity: Not Currently  Other Topics Concern   Not on file  Social History Narrative   Not on file   Social Determinants of Health   Financial Resource Strain: Low Risk  (01/10/2022)   Overall Financial Resource Strain (CARDIA)    Difficulty of Paying Living Expenses: Not hard at all  Food Insecurity: No Food Insecurity (01/10/2022)   Hunger Vital Sign    Worried About Running  Out of Food in the Last Year: Never true    Ran Out of Food in the Last Year: Never true  Transportation Needs: No Transportation Needs (01/10/2022)   PRAPARE - Administrator, Civil Service (Medical): No    Lack of Transportation (Non-Medical): No  Physical Activity: Inactive (01/10/2022)   Exercise Vital Sign    Days of Exercise per Week: 0 days    Minutes of Exercise per Session: 0 min  Stress: No Stress Concern Present (01/10/2022)   [employer] of Occupational Health - Occupational Stress Questionnaire    Feeling of Stress : Only a little  Social Connections: Socially Isolated (07/24/2020)   Social  Connection and Isolation Panel [NHANES]    Frequency of Communication with Friends and Family: More than three times a week    Frequency of Social Gatherings with Friends and Family: More than three times a week    Attends Religious Services: Never    Database administrator or Organizations: No    Attends Banker Meetings: Never    Marital Status: Widowed  Intimate Partner Violence: Not At Risk (07/24/2020)   Humiliation, Afraid, Rape, and Kick questionnaire    Fear of Current or Ex-Partner: No    Emotionally Abused: No    Physically Abused: No    Sexually Abused: No     Current Outpatient Medications:    Alcohol Swabs (B-D SINGLE USE SWABS REGULAR) PADS, USE FOR CHECKING BLOOD SUGAR AT LEAST 3 TIMES DAILY, Disp: 100 each, Rfl: 3   Apoaequorin (PREVAGEN PO), Take 1 capsule by mouth daily., Disp: , Rfl:    aspirin EC 81 MG tablet, Take 1 tablet (81 mg total) by mouth daily., Disp: 90 tablet, Rfl: 3   BD PEN NEEDLE MICRO U/F 32G X 6 MM MISC, USE TO ADMINISTER INSULIN THREE TIMES DAILY, Disp: 300 each, Rfl: 1   benzonatate (TESSALON) 100 MG capsule, Take 1 capsule (100 mg total) by mouth 2 (two) times daily as needed for up to 10 days., Disp: 20 capsule, Rfl: 0   Cetirizine HCl (ZYRTEC ALLERGY PO), Take 1 tablet by mouth daily., Disp: , Rfl:    Coenzyme Q10 (COQ10) 200 MG CAPS, Take 1 capsule by mouth daily., Disp: , Rfl:    Continuous Blood Gluc Sensor (FREESTYLE LIBRE 2 SENSOR) MISC, Use to check blood sugars., Disp: 2 each, Rfl: 11   esomeprazole (NEXIUM) 20 MG capsule, Take 20 mg by mouth daily at 12 noon., Disp: , Rfl:    FARXIGA 5 MG TABS tablet, TAKE 1 TABLET BY MOUTH DAILY BEFORE BREAKFAST., Disp: 90 tablet, Rfl: 3   insulin aspart (NOVOLOG FLEXPEN) 100 UNIT/ML FlexPen, Inject 14 Units into the skin 2 (two) times daily with a meal., Disp: 45 mL, Rfl: 11   insulin glargine (LANTUS SOLOSTAR) 100 UNIT/ML Solostar Pen, Inject 20 Units into the skin at bedtime., Disp: 60 mL,  Rfl: 4   losartan-hydrochlorothiazide (HYZAAR) 100-25 MG tablet, TAKE 1 TABLET BY MOUTH EVERY DAY, Disp: 90 tablet, Rfl: 3   metFORMIN (GLUCOPHAGE) 500 MG tablet, TAKE 1 TABLET BY MOUTH TWICE A DAY, Disp: 180 tablet, Rfl: 2   Multiple Vitamins-Minerals (CENTRUM PO), Take 1 tablet by mouth daily., Disp: , Rfl:    Naproxen Sodium (ALEVE PO), Take 2 tablets by mouth daily as needed (pain). , Disp: , Rfl:    nirmatrelvir/ritonavir (PAXLOVID) 20 x 150 MG & 10 x 100MG  TABS, Take 3 tablets by mouth 2 (two) times daily for  5 days. (Take nirmatrelvir 150 mg two tablets twice daily for 5 days and ritonavir 100 mg one tablet twice daily for 5 days) Patient GFR is 59 in 03/2023, no hx of CKD., Disp: 30 tablet, Rfl: 0   Omega-3 Fatty Acids (OMEGA 3 PO), Take 1 capsule by mouth daily., Disp: , Rfl:    Polyethyl Glycol-Propyl Glycol (SYSTANE ULTRA OP), Apply 1 drop to eye daily., Disp: , Rfl:    Probiotic Product (PROBIOTIC PO), Take 1 capsule by mouth at bedtime., Disp: , Rfl:    rosuvastatin (CRESTOR) 20 MG tablet, TAKE 1 TABLET BY MOUTH EVERYDAY AT BEDTIME, Disp: 90 tablet, Rfl: 3   Sennosides-Docusate Sodium (SENOKOT S PO), Take 1 tablet by mouth 2 (two) times daily., Disp: , Rfl:    TOPROL XL 25 MG 24 hr tablet, TAKE 2 TABLETS (50 MG) IN THE MORNING AND 1 TABLET (25 MG) IN THE EVENING - Pt needs to keep upcoming appt in Sept for a 90 day supply, Disp: 90 tablet, Rfl: 0   Turmeric 500 MG TABS, Take 1 tablet by mouth daily., Disp: , Rfl:    ZOLOFT 25 MG tablet, Take 1 tablet (25 mg total) by mouth daily., Disp: 30 tablet, Rfl: 3  EXAM:  VITALS per patient if applicable:Ht 5\' 3"  (1.6 m)   BMI 28.41 kg/m   GENERAL: alert, oriented, appears well and in no acute distress  HEENT: atraumatic, conjunctiva clear, no obvious abnormalities on inspection of external nose and ears  NECK: normal movements of the head and neck  LUNGS: on inspection no signs of respiratory distress, breathing rate appears normal, no  obvious gross SOB, gasping or wheezing  CV: no obvious cyanosis  MS: moves all visible extremities without noticeable abnormality  PSYCH/NEURO: pleasant and cooperative, no obvious depression or anxiety, speech and thought processing grossly intact. Oriented x 3.  ASSESSMENT AND PLAN:  Discussed the following assessment and plan:  COVID-19 virus infection - Plan: nirmatrelvir/ritonavir (PAXLOVID) 20 x 150 MG & 10 x 100MG  TABS We discussed Dx,possible complications and treatment options. She has a mild to moderate case with risk for complications. We discussed oral antiviral options and side effects. She agrees with trying Paxlovid, will recommend normal dose.  Her last EGFR 59, which her granddaughter attributes to dehydration but no history of CKD. Symptomatic treatment with plenty of fluids,rest,tylenol 500 mg 3-4 times per day prn. Stressed the importance of adequate hydration. Explained that cough and congestion may last a few more days and even weeks after acute symptoms have resolved. Clearly instructed about warning signs.  Acute cough - Plan: benzonatate (TESSALON) 100 MG capsule Benzonatate recommended for cough management. Plain Mucinex may also help. Adequate hydration.  We discussed possible serious and likely etiologies, options for evaluation and workup, limitations of telemedicine visit vs in person visit, treatment, treatment risks and precautions. The patient was advised to call back or seek an in-person evaluation if the symptoms worsen or if the condition fails to improve as anticipated. I discussed the assessment and treatment plan with the patient. The patient was provided an opportunity to ask questions and all were answered. The patient agreed with the plan and demonstrated an understanding of the instructions.  Return if symptoms worsen or fail to improve.  I, Rolla Etienne Wierda, acting as a scribe for Nyshawn Gowdy [place], MD., have documented all relevant  documentation on the behalf of Tyrann Donaho [place], MD, as directed by  Kollyns Mickelson [place], MD while in the presence of Taitum Alms [place], MD.  I, Lillyanne Bradburn [place], MD, have reviewed all documentation for this visit. The documentation on 06/16/23 for the exam, diagnosis, procedures, and orders are all accurate and complete.  Kanisha Duba [place], MD

## 2023-06-21 NOTE — Progress Notes (Unsigned)
Cardiology Office Note:  .   Date:  06/22/2023  ID:  Karen Powell, DOB [birth year], MRN [id number] PCP: Deeann Saint, MD  [facility] Providers Cardiologist:  Lesleigh Noe, MD (Inactive) {  History of Present Illness: Marland Kitchen   Karen Powell is a 87 y.o. female with a past medical history of hypertension, hyperlipidemia, DM 2, and palpitations here for follow-up appointment.  Last seen by Dr. Katrinka Blazing 03/28/2022.  She was with her daughter.  Conversations were in many different areas.  No cardiopulmonary complaints.  No orthopnea.  Mild pitting edema.  She takes her medications as prescribed.  Today, she tells me that her blood sugars have been up over 300 at times but she checks them meticulously.  Knows that she sometimes could eat better.  She has been traveling a lot since retirement and enjoys doing this.  Labs look good today.  Blood pressure is elevated at 168/80.  I have encouraged her to obtain a blood pressure cuff and track at home.  She is compliant with her medications.  Would advise a low-sodium, heart healthy diet  Reports no shortness of breath nor dyspnea on exertion. Reports no chest pain, pressure, or tightness. No edema, orthopnea, PND. Reports no palpitations.   ROS: Pertinent ROS in HPI  Studies Reviewed: Marland Kitchen   EKG Interpretation Date/Time:  Monday June 22 2023 13:23:24 EDT Ventricular Rate:  79 PR Interval:  160 QRS Duration:  78 QT Interval:  364 QTC Calculation: 417 R Axis:   -17  Text Interpretation: Normal sinus rhythm Minimal voltage criteria for LVH, may be normal variant ( R in aVL ) No previous ECGs available Confirmed by Jari Favre [phone]) on 06/22/2023 2:28:45 PM    None.       Physical Exam:   VS:  BP (!) 168/80   Pulse 72   Ht 5\' 3"  (1.6 m)   Wt 151 lb 9.6 oz (68.8 kg)   SpO2 98%   BMI 26.85 kg/m    Wt Readings from Last 3 Encounters:  06/22/23 151 lb 9.6 oz (68.8 kg)  04/09/23 160 lb 6.4 oz (72.8 kg)  12/11/22 155 lb 3.2 oz  (70.4 kg)    GEN: Well nourished, well developed in no acute distress NECK: No JVD; No carotid bruits CARDIAC: RRR, no murmurs, rubs, gallops RESPIRATORY:  Clear to auscultation without rales, wheezing or rhonchi  ABDOMEN: Soft, non-tender, non-distended EXTREMITIES:  No edema; No deformity   ASSESSMENT AND PLAN: .   1.  Dementia -behavior seems to be on par with previous visits  2.  Aortic dilation -recommended to continue checking her BP at home and watching salt intake -Continue aspirin 81 mg daily, Farxiga 5 mg daily, Hyzaar 100-25 mg daily, metoprolol succinate 50 mg in the morning and 25 mg in the evening (given a 90-day supply), Crestor 20 mg at bedtime  3.  Mixed hyperlipidemia -Recent lab work shows LDL 82, HDL 47, total cholesterol 756, triglycerides 126, at goal -continue current medications  4.  Type 2 diabetes mellitus -Blood sugars have been variable anywhere from 120s to 300 -She keeps track meticulously on a note pad -Would recommend following up with primary care for further suggestions  5.  Hypertension -BP a little high today -Recommend blood pressure cuff with daily monitoring an hour after morning medication      Dispo: She can follow-up with Dr. Lynnette Caffey in a year  Signed, Sharlene Dory, PA-C

## 2023-06-22 ENCOUNTER — Ambulatory Visit: Payer: Medicare Other | Attending: Physician Assistant | Admitting: Physician Assistant

## 2023-06-22 ENCOUNTER — Encounter: Payer: Self-pay | Admitting: Physician Assistant

## 2023-06-22 VITALS — BP 168/80 | HR 72 | Ht 63.0 in | Wt 151.6 lb

## 2023-06-22 DIAGNOSIS — E782 Mixed hyperlipidemia: Secondary | ICD-10-CM | POA: Diagnosis not present

## 2023-06-22 DIAGNOSIS — Z794 Long term (current) use of insulin: Secondary | ICD-10-CM | POA: Insufficient documentation

## 2023-06-22 DIAGNOSIS — F0390 Unspecified dementia without behavioral disturbance: Secondary | ICD-10-CM | POA: Diagnosis not present

## 2023-06-22 DIAGNOSIS — I77819 Aortic ectasia, unspecified site: Secondary | ICD-10-CM | POA: Diagnosis not present

## 2023-06-22 DIAGNOSIS — I10 Essential (primary) hypertension: Secondary | ICD-10-CM | POA: Diagnosis not present

## 2023-06-22 DIAGNOSIS — E118 Type 2 diabetes mellitus with unspecified complications: Secondary | ICD-10-CM | POA: Insufficient documentation

## 2023-06-22 DIAGNOSIS — I719 Aortic aneurysm of unspecified site, without rupture: Secondary | ICD-10-CM | POA: Diagnosis not present

## 2023-06-22 NOTE — Patient Instructions (Addendum)
Medication Instructions:  Your physician recommends that you continue on your current medications as directed. Please refer to the Current Medication list given to you today.  *If you need a refill on your cardiac medications before your next appointment, please call your pharmacy*  Lab Work: None ordered If you have labs (blood work) drawn today and your tests are completely normal, you will receive your results only by: MyChart Message (if you have MyChart) OR A paper copy in the mail If you have any lab test that is abnormal or we need to change your treatment, we will call you to review the results.  Follow-Up: At [facility], you and your health needs are our priority.  As part of our continuing mission to provide you with exceptional heart care, we have created designated Provider Care Teams.  These Care Teams include your primary Cardiologist (physician) and Advanced Practice Providers (APPs -  Physician Assistants and Nurse Practitioners) who all work together to provide you with the care you need, when you need it.  We recommend signing up for the patient portal called "MyChart".  Sign up information is provided on this After Visit Summary.  MyChart is used to connect with patients for Virtual Visits (Telemedicine).  Patients are able to view lab/test results, encounter notes, upcoming appointments, etc.  Non-urgent messages can be sent to your provider as well.   To learn more about what you can do with MyChart, go to [email or web address].    Your next appointment:   1 year(s)  Provider:   Dr Lynnette Caffey  Other Instructions Purchase a home blood pressure monitor and check your blood pressure daily, 1 hr after morning medications for 2 weeks, keep a log and call us with the readings at the end of the 2 weeks.    Heart-Healthy Eating Plan Many factors influence your heart health, including eating and exercise habits. Heart health is also called coronary health. Coronary  risk increases with abnormal blood fat (lipid) levels. A heart-healthy eating plan includes limiting unhealthy fats, increasing healthy fats, limiting salt (sodium) intake, and making other diet and lifestyle changes. What is my plan? Your health care provider may recommend that: You limit your fat intake to _________% or less of your total calories each day. You limit your saturated fat intake to _________% or less of your total calories each day. You limit the amount of cholesterol in your diet to less than _________ mg per day. You limit the amount of sodium in your diet to less than _________ mg per day. What are tips for following this plan? Cooking Cook foods using methods other than frying. Baking, boiling, grilling, and broiling are all good options. Other ways to reduce fat include: Removing the skin from poultry. Removing all visible fats from meats. Steaming vegetables in water or broth. Meal planning  At meals, imagine dividing your plate into fourths: Fill one-half of your plate with vegetables and green salads. Fill one-fourth of your plate with whole grains. Fill one-fourth of your plate with lean protein foods. Eat 2-4 cups of vegetables per day. One cup of vegetables equals 1 cup (91 g) broccoli or cauliflower florets, 2 medium carrots, 1 large bell pepper, 1 large sweet potato, 1 large tomato, 1 medium white potato, 2 cups (150 g) raw leafy greens. Eat 1-2 cups of fruit per day. One cup of fruit equals 1 small apple, 1 large banana, 1 cup (237 g) mixed fruit, 1 large orange,  cup (82 g) dried fruit,  1 cup (240 mL) 100% fruit juice. Eat more foods that contain soluble fiber. Examples include apples, broccoli, carrots, beans, peas, and barley. Aim to get 25-30 g of fiber per day. Increase your consumption of legumes, nuts, and seeds to 4-5 servings per week. One serving of dried beans or legumes equals  cup (90 g) cooked, 1 serving of nuts is  oz (12 almonds, 24  pistachios, or 7 walnut halves), and 1 serving of seeds equals  oz (8 g). Fats Choose healthy fats more often. Choose monounsaturated and polyunsaturated fats, such as olive and canola oils, avocado oil, flaxseeds, walnuts, almonds, and seeds. Eat more omega-3 fats. Choose salmon, mackerel, sardines, tuna, flaxseed oil, and ground flaxseeds. Aim to eat fish at least 2 times each week. Check food labels carefully to identify foods with trans fats or high amounts of saturated fat. Limit saturated fats. These are found in animal products, such as meats, butter, and cream. Plant sources of saturated fats include palm oil, palm kernel oil, and coconut oil. Avoid foods with partially hydrogenated oils in them. These contain trans fats. Examples are stick margarine, some tub margarines, cookies, crackers, and other baked goods. Avoid fried foods. General information Eat more home-cooked food and less restaurant, buffet, and fast food. Limit or avoid alcohol. Limit foods that are high in added sugar and simple starches such as foods made using white refined flour (white breads, pastries, sweets). Lose weight if you are overweight. Losing just 5-10% of your body weight can help your overall health and prevent diseases such as diabetes and heart disease. Monitor your sodium intake, especially if you have high blood pressure. Talk with your health care provider about your sodium intake. Try to incorporate more vegetarian meals weekly. What foods should I eat? Fruits All fresh, canned (in natural juice), or frozen fruits. Vegetables Fresh or frozen vegetables (raw, steamed, roasted, or grilled). Green salads. Grains Most grains. Choose whole wheat and whole grains most of the time. Rice and pasta, including brown rice and pastas made with whole wheat. Meats and other proteins Lean, well-trimmed beef, veal, pork, and lamb. Chicken and [place] without skin. All fish and shellfish. Wild duck, rabbit,  pheasant, and venison. Egg whites or low-cholesterol egg substitutes. Dried beans, peas, lentils, and tofu. Seeds and most nuts. Dairy Low-fat or nonfat cheeses, including ricotta and mozzarella. Skim or 1% milk (liquid, powdered, or evaporated). Buttermilk made with low-fat milk. Nonfat or low-fat yogurt. Fats and oils Non-hydrogenated (trans-free) margarines. Vegetable oils, including soybean, sesame, sunflower, olive, avocado, peanut, safflower, corn, canola, and cottonseed. Salad dressings or mayonnaise made with a vegetable oil. Beverages Water (mineral or sparkling). Coffee and tea. Unsweetened ice tea. Diet beverages. Sweets and desserts Sherbet, gelatin, and fruit ice. Small amounts of dark chocolate. Limit all sweets and desserts. Seasonings and condiments All seasonings and condiments. The items listed above may not be a complete list of foods and beverages you can eat. Contact a dietitian for more options. What foods should I avoid? Fruits Canned fruit in heavy syrup. Fruit in cream or butter sauce. Fried fruit. Limit coconut. Vegetables Vegetables cooked in cheese, cream, or butter sauce. Fried vegetables. Grains Breads made with saturated or trans fats, oils, or whole milk. Croissants. Sweet rolls. Donuts. High-fat crackers, such as cheese crackers and chips. Meats and other proteins Fatty meats, such as hot dogs, ribs, sausage, bacon, rib-eye roast or steak. High-fat deli meats, such as salami and bologna. Caviar. Domestic duck and goose. Organ meats, such  as liver. Dairy Cream, sour cream, cream cheese, and creamed cottage cheese. Whole-milk cheeses. Whole or 2% milk (liquid, evaporated, or condensed). Whole buttermilk. Cream sauce or high-fat cheese sauce. Whole-milk yogurt. Fats and oils Meat fat, or shortening. Cocoa butter, hydrogenated oils, palm oil, coconut oil, palm kernel oil. Solid fats and shortenings, including bacon fat, salt pork, lard, and butter. Nondairy cream  substitutes. Salad dressings with cheese or sour cream. Beverages Regular sodas and any drinks with added sugar. Sweets and desserts Frosting. Pudding. Cookies. Cakes. Pies. Milk chocolate or white chocolate. Buttered syrups. Full-fat ice cream or ice cream drinks. The items listed above may not be a complete list of foods and beverages to avoid. Contact a dietitian for more information. Summary Heart-healthy meal planning includes limiting unhealthy fats, increasing healthy fats, limiting salt (sodium) intake and making other diet and lifestyle changes. Lose weight if you are overweight. Losing just 5-10% of your body weight can help your overall health and prevent diseases such as diabetes and heart disease. Focus on eating a balance of foods, including fruits and vegetables, low-fat or nonfat dairy, lean protein, nuts and legumes, whole grains, and heart-healthy oils and fats. This information is not intended to replace advice given to you by your health care provider. Make sure you discuss any questions you have with your health care provider. Document Revised: 10/14/2021 Document Reviewed: 10/14/2021 Elsevier Patient Education  2024 Elsevier Inc. Low-Sodium Eating Plan Salt (sodium) helps you keep a healthy balance of fluids in your body. Too much sodium can raise your blood pressure. It can also cause fluid and waste to be held in your body. Your health care provider or dietitian may recommend a low-sodium eating plan if you have high blood pressure (hypertension), kidney disease, liver disease, or heart failure. Eating less sodium can help lower your blood pressure and reduce swelling. It can also protect your heart, liver, and kidneys. What are tips for following this plan? Reading food labels  Check food labels for the amount of sodium per serving. If you eat more than one serving, you must multiply the listed amount by the number of servings. Choose foods with less than 140 milligrams  (mg) of sodium per serving. Avoid foods with 300 mg of sodium or more per serving. Always check how much sodium is in a product, even if the label says "unsalted" or "no salt added." Shopping  Buy products labeled as "low-sodium" or "no salt added." Buy fresh foods. Avoid canned foods and pre-made or frozen meals. Avoid canned, cured, or processed meats. Buy breads that have less than 80 mg of sodium per slice. Cooking  Eat more home-cooked food. Try to eat less restaurant, buffet, and fast food. Try not to add salt when you cook. Use salt-free seasonings or herbs instead of table salt or sea salt. Check with your provider or pharmacist before using salt substitutes. Cook with plant-based oils, such as canola, sunflower, or olive oil. Meal planning When eating at a restaurant, ask if your food can be made with less salt or no salt. Avoid dishes labeled as brined, pickled, cured, or smoked. Avoid dishes made with soy sauce, miso, or teriyaki sauce. Avoid foods that have monosodium glutamate (MSG) in them. MSG may be added to some restaurant food, sauces, soups, bouillon, and canned foods. Make meals that can be grilled, baked, poached, roasted, or steamed. These are often made with less sodium. General information Try to limit your sodium intake to 1,500-2,300 mg each day, or  the amount told by your provider. What foods should I eat? Fruits Fresh, frozen, or canned fruit. Fruit juice. Vegetables Fresh or frozen vegetables. "No salt added" canned vegetables. "No salt added" tomato sauce and paste. Low-sodium or reduced-sodium tomato and vegetable juice. Grains Low-sodium cereals, such as oats, puffed wheat and rice, and shredded wheat. Low-sodium crackers. Unsalted rice. Unsalted pasta. Low-sodium bread. Whole grain breads and whole grain pasta. Meats and other proteins Fresh or frozen meat, poultry, seafood, and fish. These should have no added salt. Low-sodium canned tuna and salmon.  Unsalted nuts. Dried peas, beans, and lentils without added salt. Unsalted canned beans. Eggs. Unsalted nut butters. Dairy Milk. Soy milk. Cheese that is naturally low in sodium, such as ricotta cheese, fresh mozzarella, or Swiss cheese. Low-sodium or reduced-sodium cheese. Cream cheese. Yogurt. Seasonings and condiments Fresh and dried herbs and spices. Salt-free seasonings. Low-sodium mustard and ketchup. Sodium-free salad dressing. Sodium-free light mayonnaise. Fresh or refrigerated horseradish. Lemon juice. Vinegar. Other foods Homemade, reduced-sodium, or low-sodium soups. Unsalted popcorn and pretzels. Low-salt or salt-free chips. The items listed above may not be all the foods and drinks you can have. Talk to a dietitian to learn more. What foods should I avoid? Vegetables Sauerkraut, pickled vegetables, and relishes. Olives. [place] fries. Onion rings. Regular canned vegetables, except low-sodium or reduced-sodium items. Regular canned tomato sauce and paste. Regular tomato and vegetable juice. Frozen vegetables in sauces. Grains Instant hot cereals. Bread stuffing, pancake, and biscuit mixes. Croutons. Seasoned rice or pasta mixes. Noodle soup cups. Boxed or frozen macaroni and cheese. Regular salted crackers. Self-rising flour. Meats and other proteins Meat or fish that is salted, canned, smoked, spiced, or pickled. Precooked or cured meat, such as sausages or meat loaves. Tomasa Blase. Ham. Pepperoni. Hot dogs. Corned beef. Chipped beef. Salt pork. Jerky. Pickled herring, anchovies, and sardines. Regular canned tuna. Salted nuts. Dairy Processed cheese and cheese spreads. Hard cheeses. Cheese curds. Blue cheese. Feta cheese. String cheese. Regular cottage cheese. Buttermilk. Canned milk. Fats and oils Salted butter. Regular margarine. Ghee. Bacon fat. Seasonings and condiments Onion salt, garlic salt, seasoned salt, table salt, and sea salt. Canned and packaged gravies. Worcestershire sauce.  Tartar sauce. Barbecue sauce. Teriyaki sauce. Soy sauce, including reduced-sodium soy sauce. Steak sauce. Fish sauce. Oyster sauce. Cocktail sauce. Horseradish that you find on the shelf. Regular ketchup and mustard. Meat flavorings and tenderizers. Bouillon cubes. Hot sauce. Pre-made or packaged marinades. Pre-made or packaged taco seasonings. Relishes. Regular salad dressings. Salsa. Other foods Salted popcorn and pretzels. Corn chips and puffs. Potato and tortilla chips. Canned or dried soups. Pizza. Frozen entrees and pot pies. The items listed above may not be all the foods and drinks you should avoid. Talk to a dietitian to learn more. This information is not intended to replace advice given to you by your health care provider. Make sure you discuss any questions you have with your health care provider. Document Revised: 09/25/2022 Document Reviewed: 09/25/2022 Elsevier Patient Education  2024 [employer].

## 2023-07-01 ENCOUNTER — Other Ambulatory Visit: Payer: Self-pay | Admitting: Physician Assistant

## 2023-07-09 ENCOUNTER — Telehealth: Payer: Self-pay | Admitting: Interventional Cardiology

## 2023-07-09 ENCOUNTER — Encounter: Payer: Self-pay | Admitting: Family Medicine

## 2023-07-09 ENCOUNTER — Ambulatory Visit: Payer: PRIVATE HEALTH INSURANCE | Admitting: Family Medicine

## 2023-07-09 ENCOUNTER — Other Ambulatory Visit: Payer: Self-pay | Admitting: Family Medicine

## 2023-07-09 ENCOUNTER — Ambulatory Visit: Payer: Medicare Other | Admitting: Family Medicine

## 2023-07-09 VITALS — BP 140/82 | HR 52 | Temp 97.8°F | Ht 63.0 in | Wt 155.8 lb

## 2023-07-09 DIAGNOSIS — R600 Localized edema: Secondary | ICD-10-CM

## 2023-07-09 DIAGNOSIS — I10 Essential (primary) hypertension: Secondary | ICD-10-CM

## 2023-07-09 DIAGNOSIS — Z794 Long term (current) use of insulin: Secondary | ICD-10-CM | POA: Diagnosis not present

## 2023-07-09 DIAGNOSIS — E1140 Type 2 diabetes mellitus with diabetic neuropathy, unspecified: Secondary | ICD-10-CM

## 2023-07-09 DIAGNOSIS — G309 Alzheimer's disease, unspecified: Secondary | ICD-10-CM

## 2023-07-09 DIAGNOSIS — F0280 Dementia in other diseases classified elsewhere without behavioral disturbance: Secondary | ICD-10-CM | POA: Diagnosis not present

## 2023-07-09 MED ORDER — TOPROL XL 25 MG PO TB24: ORAL_TABLET | ORAL | 3 refills | Status: DC

## 2023-07-09 NOTE — Patient Instructions (Addendum)
An order for an ultrasound of your left leg was placed to make sure you do not have a blood clot causing the swelling and increased warmth in it..  You should expect a phone call about setting up this appointment.

## 2023-07-09 NOTE — Progress Notes (Signed)
Established Patient Office Visit   Subjective  Patient ID: Karen Powell, female    DOB: 30-Aug-1936  Age: 87 y.o. MRN: [id number]  Chief Complaint  Patient presents with   Medical Management of Chronic Issues    Hypertension   Patient's daughter, Karen Powell waiting in car.  Patient is an 87 year old female with pmh sig for dementia, HTN, DM2, HLD who is seen for follow-up on chronic conditions.  Patient upset that she was not given a 90-day supply of metoprolol from pharmacy.  Explained cardiology requested patient schedule and keep a follow-up appointment for further refills.  Patient checking blood sugar regularly at home.  Continue fluctuations in readings.  Patient mentions rash on LLE.  On inspection LLE edematous with increased warmth.  Denies pain.  Unsure on duration of LLE symptoms, notes intermittent edema.    Patient Active Problem List   Diagnosis Date Noted   Alzheimer disease (HCC) 08/01/2020   Trigger little finger of left hand 11/25/2018   Gastroesophageal reflux disease 11/25/2018   Palpitations 03/30/2017   Right hip pain 03/16/2017   Chronic fatigue 02/11/2017   Diabetes mellitus (HCC) 12/30/2016   Hyperlipidemia 12/30/2016   Essential hypertension 12/30/2016   Past Medical History:  Diagnosis Date   Arthritis    Blood transfusion without reported diagnosis    Depression    Diabetic neuropathy (HCC)    Dizziness    Frequent headaches    Hyperlipidemia    Hypertension    Migraines    Palpitations    Urinary incontinence    Past Surgical History:  Procedure Laterality Date   none     Social History   Tobacco Use   Smoking status: Never   Smokeless tobacco: Never  Vaping Use   Vaping status: Never Used  Substance Use Topics   Alcohol use: Yes   Drug use: No   Family History  Problem Relation Age of Onset   Diabetes Mother    Hypertension Mother    Diabetes Maternal Grandmother    Diabetes Maternal Grandfather    Allergies   Allergen Reactions   Penicillins Anaphylaxis      ROS Negative unless stated above    Objective:     BP (!) 140/82 (BP Location: Right Arm, Patient Position: Sitting, Cuff Size: Normal) Comment: patient is upset with the pharmacy  Pulse (!) 52   Temp 97.8 F (36.6 C) (Oral)   Ht 5\' 3"  (1.6 m)   Wt 155 lb 12.8 oz (70.7 kg)   SpO2 (!) 88% Comment: patient hands are cold  BMI 27.60 kg/m  BP Readings from Last 3 Encounters:  07/09/23 (!) 140/82  06/22/23 (!) 168/80  04/09/23 (!) 148/88   Wt Readings from Last 3 Encounters:  07/09/23 155 lb 12.8 oz (70.7 kg)  06/22/23 151 lb 9.6 oz (68.8 kg)  04/09/23 160 lb 6.4 oz (72.8 kg)      Physical Exam Constitutional:      General: She is not in acute distress.    Appearance: Normal appearance.  HENT:     Head: Normocephalic and atraumatic.     Nose: Nose normal.     Mouth/Throat:     Mouth: Mucous membranes are moist.  Cardiovascular:     Rate and Rhythm: Normal rate and regular rhythm.     Heart sounds: Normal heart sounds. No murmur heard.    No gallop.     Comments: L UE knee down tight with increased warmth, excoriation.  Negative Denna Haggard'  sign or TTP of calf. Pulmonary:     Effort: Pulmonary effort is normal. No respiratory distress.     Breath sounds: Normal breath sounds. No wheezing, rhonchi or rales.  Musculoskeletal:     Left lower leg: Edema present.  Skin:    General: Skin is warm and dry.     Findings: Lesion present.  Neurological:     Mental Status: She is alert.     Cranial Nerves: Cranial nerves 2-12 are intact.     Comments: A&O x 2 (person and place)    No results found for any visits on 07/09/23.    Assessment & Plan:  Edema of left lower extremity -     D-dimer, quantitative; Future -     VAS [place] LOWER EXTREMITY VENOUS (DVT); Future  Essential hypertension  Type 2 diabetes mellitus with diabetic neuropathy, with long-term current use of insulin (HCC)  Alzheimer disease (HCC)  LLE  edema and increased warmth concerning for DVT.  Also consider cellulitis.  Obtain Doppler.  Contact patient's daughter Karen Powell to help with scheduling.  Given strict precautions.  Patient advised to contact cardiology regarding refills.  Will need to set up an appointment.  BP and DM stable.  Hemoglobin A1c 8.1% on 04/09/2023.  Foot exam at next OFV.  Continue current medications.  Hesitant to make drastic changes to meds due to history of Alzheimer's.  Family involvement encouraged as pt allows.  Discussed the importance of HCPOA, etc.  Return in about 3 months (around 10/09/2023), or if symptoms worsen or fail to improve.   Deeann Saint, MD

## 2023-07-09 NOTE — Telephone Encounter (Signed)
Pt's medication was sent to pt's pharmacy as requested. Confirmation received.  °

## 2023-07-09 NOTE — Telephone Encounter (Signed)
*  STAT* If patient is at the pharmacy, call can be transferred to refill team.   1. Which medications need to be refilled? (please list name of each medication and dose if known) TOPROL XL 25 MG 24 hr tablet   2. Which pharmacy/location (including street and city if local pharmacy) is medication to be sent to? [id number] - [place], [place] - [address]   3. Do they need a 30 day or 90 day supply? 90 DAYS   Patient was seen 09/30 with Jari Favre, PA.

## 2023-07-13 ENCOUNTER — Telehealth: Payer: Self-pay | Admitting: Family Medicine

## 2023-07-13 DIAGNOSIS — E1140 Type 2 diabetes mellitus with diabetic neuropathy, unspecified: Secondary | ICD-10-CM

## 2023-07-13 DIAGNOSIS — Z794 Long term (current) use of insulin: Secondary | ICD-10-CM

## 2023-07-13 DIAGNOSIS — E782 Mixed hyperlipidemia: Secondary | ICD-10-CM

## 2023-07-13 DIAGNOSIS — I10 Essential (primary) hypertension: Secondary | ICD-10-CM

## 2023-07-13 MED ORDER — LOSARTAN POTASSIUM-HCTZ 100-25 MG PO TABS: 1.0000 | ORAL_TABLET | Freq: Every day | ORAL | 3 refills | Status: DC

## 2023-07-13 MED ORDER — METFORMIN HCL 500 MG PO TABS: 500.0000 mg | ORAL_TABLET | Freq: Two times a day (BID) | ORAL | 2 refills | Status: DC

## 2023-07-13 MED ORDER — ROSUVASTATIN CALCIUM 20 MG PO TABS: ORAL_TABLET | ORAL | 3 refills | Status: DC

## 2023-07-13 NOTE — Telephone Encounter (Signed)
Please advise 

## 2023-07-13 NOTE — Telephone Encounter (Signed)
Pt's daughter on the line asking where her mother is supposed to go for her [place] to r/o DVT. [facility] [address] [place]. Pt says the callback number was to this office. She would like verification of where the [place] will take place

## 2023-07-13 NOTE — Telephone Encounter (Signed)
Pt daughter called back checking on the status of the referral placed. Let her know of below msg from Dr. Salomon Fick since she is on the [facility]. Pt is going to call their office and sched an appt.  FYI.

## 2023-07-13 NOTE — Telephone Encounter (Signed)
Patient/daughter should receive a call regarding scheduling of the ultrasound.  The order was placed for [facility] outpatient on [facility].  We do not have ultrasound here in the [place] clinic.

## 2023-07-13 NOTE — Telephone Encounter (Signed)
Prescription Request  07/13/2023  LOV: 07/09/2023  What is the name of the medication or equipment? BD PEN NEEDLE MICRO U/F 32G X 6 MM MISC , FARXIGA 5 MG TABS tablet ,  losartan-hydrochlorothiazide (HYZAAR) 100-25 MG tablet, metformin 500mg , rosuvastatin 20 mg  Which pharmacy would you like this sent to?  [id number] [place], [place] - [address] [place] [address] Phone: [phone] Fax: [phone]    Patient notified that their request is being sent to the clinical staff for review and that they should receive a response within 2 business days.   Please advise at [facility] [phone]

## 2023-07-13 NOTE — Telephone Encounter (Signed)
Rx refilled.

## 2023-07-14 ENCOUNTER — Other Ambulatory Visit (HOSPITAL_COMMUNITY): Payer: Self-pay

## 2023-07-14 ENCOUNTER — Ambulatory Visit (HOSPITAL_COMMUNITY)
Admission: RE | Admit: 2023-07-14 | Discharge: 2023-07-14 | Disposition: A | Discharge status: Home / Self Care | Payer: Medicare Other | Source: Ambulatory Visit | Attending: Family Medicine | Admitting: Family Medicine

## 2023-07-14 ENCOUNTER — Ambulatory Visit (HOSPITAL_BASED_OUTPATIENT_CLINIC_OR_DEPARTMENT_OTHER)
Admission: RE | Admit: 2023-07-14 | Discharge: 2023-07-14 | Disposition: A | Discharge status: Home / Self Care | Payer: Medicare Other | Source: Ambulatory Visit | Attending: Family Medicine | Admitting: Family Medicine

## 2023-07-14 ENCOUNTER — Encounter (HOSPITAL_COMMUNITY): Payer: Self-pay

## 2023-07-14 VITALS — BP 164/84 | HR 66

## 2023-07-14 DIAGNOSIS — I82412 Acute embolism and thrombosis of left femoral vein: Secondary | ICD-10-CM | POA: Insufficient documentation

## 2023-07-14 DIAGNOSIS — R600 Localized edema: Secondary | ICD-10-CM | POA: Insufficient documentation

## 2023-07-14 MED ORDER — APIXABAN (ELIQUIS) VTE STARTER PACK (10MG AND 5MG)
ORAL_TABLET | ORAL | 0 refills | Status: DC
Start: 2023-07-14 — End: 2024-05-20
  Filled 2023-07-14: qty 74, 30d supply, fill #0

## 2023-07-14 MED ORDER — APIXABAN 5 MG PO TABS
5.0000 mg | ORAL_TABLET | Freq: Two times a day (BID) | ORAL | 0 refills | Status: DC
  Filled 2023-07-14: qty 120, 60d supply, fill #0

## 2023-07-14 NOTE — Progress Notes (Signed)
DVT Clinic Note  Name: Karen Powell     MRN: [id number]     DOB: 1936/03/02     Sex: female  PCP: Deeann Saint, MD  Today's Visit: Visit Information: Initial Visit  Referred to DVT Clinic by: Primary Care - Dr. Salomon Fick Referred to CPP by: Dr. Randie Heinz Reason for referral:  Chief Complaint  Patient presents with   DVT   HISTORY OF PRESENT ILLNESS: Karen Powell is a 87 y.o. female with PMH HTN, T2DM, HLD, dementia, depression, who presents accompanied by her daughter Karen Powell after diagnosis of DVT for medication management. She was diagnosed with COVID on 06/16/23 and reports being very sick. She was in bed for multiple days, whereas she is normally very active. She has had some occasional edema in the left leg over the past couple of months but it would always come and go. When she developed swelling during her recent illness, the swelling did not go away and in fact worsened with time. She was seen by her PCP on 07/09/23 and ultrasound today showed acute DVT extending from the left femoral vein through the calf veins. She was referred to the DVT Clinic to start treatment. She denies any history of VTE. No recent injuries or travel. Denies SOB, chest pain. She is ambulating independently today.   Positive Thrombotic Risk Factors: Recent COVID diagnosis (within 3 months), Bed rest >72 hours within 3 month, Older Age Bleeding Risk Factors: Age >65 years, Antiplatelet therapy  Negative Thrombotic Risk Factors: Previous VTE, Recent surgery (within 3 months), Recent trauma (within 3 months), Recent admission to hospital with acute illness (within 3 months), Paralysis, paresis, or recent plaster cast immobilization of lower extremity, Central venous catheterization, Sedentary journey lasting >8 hours within 4 weeks, Pregnancy, Within 6 weeks postpartum, Recent cesarean section (within 3 months), Estrogen therapy, Testosterone therapy, Erythropoiesis-stimulating agent, Active cancer, Non-malignant, chronic  inflammatory condition, Known thrombophilic condition, Smoking, Obesity  Rx Insurance Coverage: Medicare Rx Affordability: Eliquis is $30 per month Rx Assistance Provided: Free 30-day trial card Preferred Pharmacy: Starter pack and remainder of 90 day supply of Eliquis filled during visit today at [facility] per patient request.   Past Medical History:  Diagnosis Date   Arthritis    Blood transfusion without reported diagnosis    Depression    Diabetic neuropathy (HCC)    Dizziness    Frequent headaches    Hyperlipidemia    Hypertension    Migraines    Palpitations    Urinary incontinence     Past Surgical History:  Procedure Laterality Date   none      Social History   Socioeconomic History   Marital status: Single    Spouse name: Not on file   Number of children: 4   Years of education: 12   Highest education level: Not on file  Occupational History   Occupation: retired  Tobacco Use   Smoking status: Never   Smokeless tobacco: Never  Vaping Use   Vaping status: Never Used  Substance and Sexual Activity   Alcohol use: Yes   Drug use: No   Sexual activity: Not Currently  Other Topics Concern   Not on file  Social History Narrative   Not on file   Social Determinants of Health   Financial Resource Strain: Low Risk  (01/10/2022)   Overall Financial Resource Strain (CARDIA)    Difficulty of Paying Living Expenses: Not hard at all  Food Insecurity: No Food Insecurity (01/10/2022)  Hunger Vital Sign    Worried About Running Out of Food in the Last Year: Never true    Ran Out of Food in the Last Year: Never true  Transportation Needs: No Transportation Needs (01/10/2022)   PRAPARE - Administrator, Civil Service (Medical): No    Lack of Transportation (Non-Medical): No  Physical Activity: Inactive (01/10/2022)   Exercise Vital Sign    Days of Exercise per Week: 0 days    Minutes of Exercise per Session: 0 min  Stress: No Stress Concern  Present (01/10/2022)   [employer] of Occupational Health - Occupational Stress Questionnaire    Feeling of Stress : Only a little  Social Connections: Socially Isolated (07/24/2020)   Social Connection and Isolation Panel [NHANES]    Frequency of Communication with Friends and Family: More than three times a week    Frequency of Social Gatherings with Friends and Family: More than three times a week    Attends Religious Services: Never    Database administrator or Organizations: No    Attends Banker Meetings: Never    Marital Status: Widowed  Intimate Partner Violence: Not At Risk (07/24/2020)   Humiliation, Afraid, Rape, and Kick questionnaire    Fear of Current or Ex-Partner: No    Emotionally Abused: No    Physically Abused: No    Sexually Abused: No    Family History  Problem Relation Age of Onset   Diabetes Mother    Hypertension Mother    Diabetes Maternal Grandmother    Diabetes Maternal Grandfather     Allergies as of 07/14/2023 - Review Complete 07/14/2023  Allergen Reaction Noted   Penicillins Anaphylaxis 03/12/2017    Current Outpatient Medications on File Prior to Encounter  Medication Sig Dispense Refill   Apoaequorin (PREVAGEN PO) Take 1 capsule by mouth daily.     aspirin EC 81 MG tablet Take 1 tablet (81 mg total) by mouth daily. 90 tablet 3   Cetirizine HCl (ZYRTEC ALLERGY PO) Take 1 tablet by mouth daily.     Coenzyme Q10 (COQ10) 200 MG CAPS Take 1 capsule by mouth daily.     esomeprazole (NEXIUM) 20 MG capsule Take 20 mg by mouth daily at 12 noon.     FARXIGA 5 MG TABS tablet TAKE 1 TABLET BY MOUTH DAILY BEFORE BREAKFAST. 90 tablet 3   insulin aspart (NOVOLOG FLEXPEN) 100 UNIT/ML FlexPen INJECT 15 UNITS INTO THE SKIN 3 (THREE) TIMES DAILY WITH MEALS. (Patient taking differently: 14 units) 15 mL 11   insulin glargine (LANTUS SOLOSTAR) 100 UNIT/ML Solostar Pen Inject 20 Units into the skin at bedtime. 60 mL 4   losartan-hydrochlorothiazide  (HYZAAR) 100-25 MG tablet Take 1 tablet by mouth daily. 90 tablet 3   metFORMIN (GLUCOPHAGE) 500 MG tablet Take 1 tablet (500 mg total) by mouth 2 (two) times daily. 180 tablet 2   Multiple Vitamins-Minerals (CENTRUM PO) Take 1 tablet by mouth daily.     Naproxen Sodium (ALEVE PO) Take 2 tablets by mouth daily as needed (pain).      Omega-3 Fatty Acids (OMEGA 3 PO) Take 1 capsule by mouth daily.     Polyethyl Glycol-Propyl Glycol (SYSTANE ULTRA OP) Apply 1 drop to eye daily.     Probiotic Product (PROBIOTIC PO) Take 1 capsule by mouth at bedtime.     rosuvastatin (CRESTOR) 20 MG tablet TAKE 1 TABLET BY MOUTH EVERYDAY AT BEDTIME 90 tablet 3   Sennosides-Docusate Sodium (SENOKOT S  PO) Take 1 tablet by mouth 2 (two) times daily.     TOPROL XL 25 MG 24 hr tablet TAKE 2 TABLETS (50 MG) IN THE MORNING AND 1 TABLET (25 MG) IN THE EVENING 270 tablet 3   Turmeric 500 MG TABS Take 1 tablet by mouth daily.     ZOLOFT 25 MG tablet Take 1 tablet (25 mg total) by mouth daily. 30 tablet 3   Alcohol Swabs (B-D SINGLE USE SWABS REGULAR) PADS USE FOR CHECKING BLOOD SUGAR AT LEAST 3 TIMES DAILY 100 each 3   BD PEN NEEDLE MICRO U/F 32G X 6 MM MISC USE TO ADMINISTER INSULIN THREE TIMES DAILY 300 each 1   Continuous Blood Gluc Sensor (FREESTYLE LIBRE 2 SENSOR) MISC Use to check blood sugars. 2 each 11   No current facility-administered medications on file prior to encounter.   REVIEW OF SYSTEMS:  Review of Systems  Respiratory:  Negative for shortness of breath.   Cardiovascular:  Positive for leg swelling. Negative for chest pain and palpitations.  Musculoskeletal:  Positive for myalgias.  Neurological:  Positive for tingling. Negative for dizziness.   PHYSICAL EXAMINATION:  Vitals:   07/14/23 1506  BP: (!) 164/84  Pulse: 66  SpO2: 100%    Physical Exam Vitals reviewed.  Cardiovascular:     Rate and Rhythm: Normal rate.  Pulmonary:     Effort: Pulmonary effort is normal.  Musculoskeletal:         General: Tenderness present.     Right lower leg: No edema.     Left lower leg: Edema present.  Skin:    Findings: Erythema (LLE) present. No bruising.   Villalta Score for Post-Thrombotic Syndrome: Pain: Moderate Cramps: Moderate Heaviness: Moderate Paresthesia: Mild Pruritus: Severe Pretibial Edema: Severe Skin Induration: Absent Hyperpigmentation: Absent Redness: Mild Venous Ectasia: Absent Pain on calf compression: Mild Villalta Preliminary Score: 15 Is venous ulcer present?: No If venous ulcer is present and score is <15, then 15 points total are assigned: Absent Villalta Total Score: 15  LABS:  CBC     Component Value Date/Time   WBC 5.4 04/09/2023 1149   RBC 4.51 04/09/2023 1149   HGB 13.3 04/09/2023 1149   HCT 40.7 04/09/2023 1149   PLT 195.0 04/09/2023 1149   MCV 90.2 04/09/2023 1149   MCH 29.6 07/11/2020 1219   MCHC 32.8 04/09/2023 1149   RDW 14.4 04/09/2023 1149   LYMPHSABS 1.7 04/09/2023 1149   MONOABS 0.5 04/09/2023 1149   EOSABS 0.1 04/09/2023 1149   BASOSABS 0.0 04/09/2023 1149    Hepatic Function      Component Value Date/Time   PROT 7.5 07/31/2022 1143   ALBUMIN 4.3 07/31/2022 1143   AST 22 07/31/2022 1143   ALT 13 07/31/2022 1143   ALKPHOS 39 07/31/2022 1143   BILITOT 0.9 07/31/2022 1143    Renal Function   Lab Results  Component Value Date   CREATININE 0.89 04/09/2023   CREATININE 0.85 07/31/2022   CREATININE 0.99 02/20/2021    CrCl cannot be calculated (Patient's most recent lab result is older than the maximum 21 days allowed.).   [facility] Vascular Lab Studies:  07/14/23 VAS [place] LOWER EXTREMITY VENOUS LEFT (DVT)  Summary:  RIGHT:  - No evidence of common femoral vein obstruction.    LEFT:  - Findings consistent with acute deep vein thrombosis involving the left  femoral vein, left popliteal vein, left posterior tibial veins, and left  peroneal veins.  - Findings consistent with age indeterminate  deep vein thrombosis  involving the  left femoral vein.  - No cystic structure found in the popliteal fossa.   ASSESSMENT: Location of DVT: Left femoral vein, Left popliteal vein, Left distal vein Cause of DVT: provoked by a transient risk factor - recent (06/16/23) COVID infection with multiple days bed rest with onset of DVT symptoms a couple days later. Will plan to treat provoked DVT with 3 months of anticoagulation. Started Eliquis starter pack today and at the patient's request provided her with the remaining 90 day supply while she was in the office from our pharmacy. No concerns on lab work for Eliquis initiation. We discussed the importance of holding Aleve while taking Eliquis and she confirmed understanding. Extensively counseled patient on the medication and utilized the teach back method to confirm how she will take Eliquis. Discussed the importance of compression and elevation to help improve swelling as well. All of her questions have been answered at this time and she is welcome to call if any come up.   PLAN: -Start apixaban (Eliquis) 10 mg twice daily for 7 days followed by 5 mg twice daily. -Expected duration of therapy: 3 months. Therapy started on 07/14/23. -Patient educated on purpose, proper use and potential adverse effects of apixaban (Eliquis). -Discussed importance of taking medication around the same time every day. -Advised patient of medications to avoid (NSAIDs, aspirin doses >100 mg daily). -Educated that Tylenol (acetaminophen) is the preferred analgesic to lower the risk of bleeding. -Advised patient to alert all providers of anticoagulation therapy prior to starting a new medication or having a procedure. -Emphasized importance of monitoring for signs and symptoms of bleeding (abnormal bruising, prolonged bleeding, nose bleeds, bleeding from gums, discolored urine, black tarry stools). -Educated patient to present to the ED if emergent signs and symptoms of new thrombosis occur. -Counseled patient to  wear compression stockings daily, removing at night. Elevate legs daily to help with swelling.   Follow up: 3 months in DVT Clinic  Pervis Hocking, PharmD, [place], CPP Deep [facility]in Thrombosis Clinic Clinical Pharmacist Practitioner Office: [phone]

## 2023-07-14 NOTE — Patient Instructions (Signed)
-  Start apixaban (Eliquis) 10 mg twice daily for 7 days followed by 5 mg twice daily. -Start taking wallet 1 THEN wallet 2 THEN the bottle. This will last you 90 days.  -It is important to take your medication around the same time every day.  -Avoid NSAIDs like ibuprofen (Advil, Motrin) and naproxen (Aleve) as well as aspirin doses over 100 mg daily. -Tylenol (acetaminophen) is the preferred over the counter pain medication to lower the risk of bleeding. -Be sure to alert all of your health care providers that you are taking an anticoagulant prior to starting a new medication or having a procedure. -Monitor for signs and symptoms of bleeding (abnormal bruising, prolonged bleeding, nose bleeds, bleeding from gums, discolored urine, black tarry stools). If you have fallen and hit your head OR if your bleeding is severe or not stopping, seek emergency care.  -Go to the emergency room if emergent signs and symptoms of new clot occur (new or worse swelling and pain in an arm or leg, shortness of breath, chest pain, fast or irregular heartbeats, lightheadedness, dizziness, fainting, coughing up blood) or if you experience a significant color change (pale or blue) in the extremity that has the DVT.  -We recommend you wear compression stockings (20-30 mmHg) as long as you are having swelling or pain. Be sure to purchase the correct size and take them off at night.   Your next visit is on Tuesday January 14th at [facility] [address] [place], [place], [place] [address] Enter the hospital through Entrance C off [facility] and pull up to the [facility] entrance to the free valet parking.  Check in for your appointment at the [facility].   If you have any questions or need to reschedule an appointment, please call [phone] [facility]  If you are having an emergency, call 911 or present to the nearest emergency room.   What is a DVT?  -Deep vein  thrombosis (DVT) is a condition in which a blood clot forms in a vein of the deep venous system which can occur in the lower leg, thigh, pelvis, arm, or neck. This condition is serious and can be life-threatening if the clot travels to the arteries of the lungs and causing a blockage (pulmonary embolism, PE). A DVT can also damage veins in the leg, which can lead to long-term venous disease, leg pain, swelling, discoloration, and ulcers or sores (post-thrombotic syndrome).  -Treatment may include taking an anticoagulant medication to prevent more clots from forming and the current clot from growing, wearing compression stockings, and/or surgical procedures to remove or dissolve the clot.

## 2023-07-16 ENCOUNTER — Telehealth: Payer: Self-pay | Admitting: Family Medicine

## 2023-07-16 DIAGNOSIS — E1140 Type 2 diabetes mellitus with diabetic neuropathy, unspecified: Secondary | ICD-10-CM

## 2023-07-16 MED ORDER — BD SWAB SINGLE USE REGULAR PADS: MEDICATED_PAD | 3 refills | Status: DC

## 2023-07-16 MED ORDER — BD PEN NEEDLE MICRO U/F 32G X 6 MM MISC: 1 refills | Status: DC

## 2023-07-16 NOTE — Telephone Encounter (Signed)
Pt is calling and would like md to return her call on IPAD pt did not elaborate the reason for call

## 2023-07-16 NOTE — Telephone Encounter (Signed)
Called and spoke with patient, I let her know that her medication was sent to the pharmacy

## 2023-08-04 ENCOUNTER — Telehealth: Payer: Self-pay | Admitting: Family Medicine

## 2023-08-04 NOTE — Telephone Encounter (Signed)
Pt requests a call regarding her insulin glargine (LANTUS SOLOSTAR) 100 UNIT/ML Solostar Pen. Says she wants to discuss making a change

## 2023-08-05 NOTE — Telephone Encounter (Signed)
Please clarify what change pt is referencing.

## 2023-08-06 NOTE — Telephone Encounter (Signed)
Pt is returning leah call

## 2023-08-06 NOTE — Telephone Encounter (Signed)
Called patient left a VM to return call

## 2023-08-06 NOTE — Telephone Encounter (Signed)
Can switch to basaglar pens.

## 2023-08-07 MED ORDER — BASAGLAR KWIKPEN 100 UNIT/ML ~~LOC~~ SOPN: 20.0000 [IU] | PEN_INJECTOR | Freq: Every day | SUBCUTANEOUS | 4 refills | Status: DC

## 2023-08-07 NOTE — Telephone Encounter (Signed)
Called and spoke with patient, she is aware of the change and medication has been sent to the pharmacy

## 2023-08-07 NOTE — Addendum Note (Signed)
Addended by: Philipp Deputy A on: 08/07/2023 10:18 AM   Modules accepted: Orders

## 2023-10-05 NOTE — Progress Notes (Addendum)
 DVT Clinic Note  Name: Karen Powell     MRN: [id number]     DOB: October 01, 1935     Sex: female  PCP: Karen Clotilda SAUNDERS, MD  Today's Visit: Visit Information: Discharge Visit  Referred to DVT Clinic by: Primary Care - Karen Powell Referred to CPP by: Dr. Pearline Reason for referral:  Chief Complaint  Patient presents with   Med Management - DVT   HISTORY OF PRESENT ILLNESS: Karen Powell is a 88 y.o. female with PMH HTN, T2DM, HLD, dementia, depression who presents accompanied by her daughter Karen Powell for follow up medication management after diagnosis of DVT on 07/14/23. Last seen in DVT Clinic 07/14/23 at which time patient reported she had been diagnosed with COVID on 06/16/23 - she was very sick and in bed for multiple days, compared to usually being very active. Had intermittent edema in her L lef for the past couple months, which worsened during her illness and became persistent leading to PCP obtaining US  and diagnosing her with a DVT extending from the L femoral vein through the calf veins. Today patient reports that her LE symptoms have mostly resolved, except for some residual edema. Denies abnormal bleeding or bruising. Denies missed doses of Eliquis  - she has 6 days of medication remaining. She is not wearing compression stockings because the ones she ordered she feels are too tight. She confirmed that she has only been taking Tylenol 1 tab twice daily for pain relief, and has not been taking her preferred pain medication, Aleve. Overall, pt has resumed her usual active lifestyle. She has an upcoming appointment with her PCP on 10/07/22. Patients daughter inquires bout possible upcoming travel to [place] , and if this would be safe for her mother.   Positive Thrombotic Risk Factors: Previous VTE, Older Age Bleeding Risk Factors: Age >65 years, Anticoagulant therapy  Negative Thrombotic Risk Factors: Recent surgery (within 3 months), Recent trauma (within 3 months), Recent admission to  hospital with acute illness (within 3 months), Paralysis, paresis, or recent plaster cast immobilization of lower extremity, Central venous catheterization, Bed rest >72 hours within 3 months, Sedentary journey lasting >8 hours within 4 weeks, Pregnancy, Within 6 weeks postpartum, Recent cesarean section (within 3 months), Testosterone therapy, Erythropoiesis-stimulating agent, Recent COVID diagnosis (within 3 months), Active cancer, Non-malignant, chronic inflammatory condition, Known thrombophilic condition, Estrogen therapy, Obesity, Smoking  Rx Insurance Coverage: Medicare Rx Affordability: Eliquis  $30 per month  Rx Assistance Provided: Free 30-day trial card Preferred Pharmacy: Previously filled starter pack and remaining 90ds of Eliquis  at [facility]  Past Medical History:  Diagnosis Date   Arthritis    Blood transfusion without reported diagnosis    Depression    Diabetic neuropathy (HCC)    Dizziness    Frequent headaches    Hyperlipidemia    Hypertension    Migraines    Palpitations    Urinary incontinence     Past Surgical History:  Procedure Laterality Date   none      Social History   Socioeconomic History   Marital status: Single    Spouse name: Not on file   Number of children: 4   Years of education: 12   Highest education level: Not on file  Occupational History   Occupation: retired  Tobacco Use   Smoking status: Never   Smokeless tobacco: Never  Vaping Use   Vaping status: Never Used  Substance and Sexual Activity   Alcohol  use: Yes   Drug use: No  Sexual activity: Not Currently  Other Topics Concern   Not on file  Social History Narrative   Not on file   Social Drivers of Health   Financial Resource Strain: Low Risk  (01/10/2022)   Overall Financial Resource Strain (CARDIA)    Difficulty of Paying Living Expenses: Not hard at all  Food Insecurity: No Food Insecurity (01/10/2022)   Hunger Vital Sign    Worried About Running Out of  Food in the Last Year: Never true    Ran Out of Food in the Last Year: Never true  Transportation Needs: No Transportation Needs (01/10/2022)   PRAPARE - Administrator, Civil Service (Medical): No    Lack of Transportation (Non-Medical): No  Physical Activity: Inactive (01/10/2022)   Exercise Vital Sign    Days of Exercise per Week: 0 days    Minutes of Exercise per Session: 0 min  Stress: No Stress Concern Present (01/10/2022)   [employer] of Occupational Health - Occupational Stress Questionnaire    Feeling of Stress : Only a little  Social Connections: Socially Isolated (07/24/2020)   Social Connection and Isolation Panel [NHANES]    Frequency of Communication with Friends and Family: More than three times a week    Frequency of Social Gatherings with Friends and Family: More than three times a week    Attends Religious Services: Never    Database Administrator or Organizations: No    Attends Banker Meetings: Never    Marital Status: Widowed  Intimate Partner Violence: Not At Risk (07/24/2020)   Humiliation, Afraid, Rape, and Kick questionnaire    Fear of Current or Ex-Partner: No    Emotionally Abused: No    Physically Abused: No    Sexually Abused: No    Family History  Problem Relation Age of Onset   Diabetes Mother    Hypertension Mother    Diabetes Maternal Grandmother    Diabetes Maternal Grandfather     Allergies as of 10/06/2023 - Review Complete 10/06/2023  Allergen Reaction Noted   Penicillins Anaphylaxis 03/12/2017    Current Outpatient Medications on File Prior to Encounter  Medication Sig Dispense Refill   apixaban  (ELIQUIS ) 5 MG TABS tablet Take 1 tablet (5 mg total) by mouth 2 (two) times daily. Start taking after completion of starter pack. 120 tablet 0   FARXIGA  5 MG TABS tablet TAKE 1 TABLET BY MOUTH DAILY BEFORE BREAKFAST. 90 tablet 3   insulin  aspart (NOVOLOG  FLEXPEN) 100 UNIT/ML FlexPen INJECT 15 UNITS INTO THE SKIN 3  (THREE) TIMES DAILY WITH MEALS. (Patient taking differently: 14 units) 15 mL 11   Insulin  Glargine (BASAGLAR  KWIKPEN) 100 UNIT/ML Inject 20 Units into the skin at bedtime. 15 mL 4   losartan -hydrochlorothiazide (HYZAAR) 100-25 MG tablet Take 1 tablet by mouth daily. 90 tablet 3   metFORMIN  (GLUCOPHAGE ) 500 MG tablet Take 1 tablet (500 mg total) by mouth 2 (two) times daily. 180 tablet 2   rosuvastatin  (CRESTOR ) 20 MG tablet TAKE 1 TABLET BY MOUTH EVERYDAY AT BEDTIME 90 tablet 3   TOPROL  XL 25 MG 24 hr tablet TAKE 2 TABLETS (50 MG) IN THE MORNING AND 1 TABLET (25 MG) IN THE EVENING 270 tablet 3   Alcohol  Swabs  (B-D SINGLE USE SWABS  REGULAR) PADS Use with insulin  pen needles 100 each 3   APIXABAN  (ELIQUIS ) VTE STARTER PACK (10MG  AND 5MG ) Take as directed on package: start with two-5mg  tablets twice daily for 7 days. On day 8,  switch to one-5mg  tablet twice daily. (Patient not taking: Reported on 10/06/2023) 74 each 0   Apoaequorin (PREVAGEN PO) Take 1 capsule by mouth daily.     aspirin  EC 81 MG tablet Take 1 tablet (81 mg total) by mouth daily. (Patient not taking: Reported on 10/06/2023) 90 tablet 3   Cetirizine HCl (ZYRTEC ALLERGY PO) Take 1 tablet by mouth daily.     Coenzyme Q10 (COQ10) 200 MG CAPS Take 1 capsule by mouth daily.     Continuous Blood Gluc Sensor (FREESTYLE LIBRE 2 SENSOR) MISC Use to check blood sugars. 2 each 11   esomeprazole (NEXIUM) 20 MG capsule Take 20 mg by mouth daily at 12 noon.     Insulin  Pen Needle (BD PEN NEEDLE MICRO U/F) 32G X 6 MM MISC Use to administer insulin  three times daily 300 each 1   Multiple Vitamins-Minerals (CENTRUM PO) Take 1 tablet by mouth daily.     Naproxen Sodium (ALEVE PO) Take 2 tablets by mouth daily as needed (pain).  (Patient not taking: Reported on 10/06/2023)     Omega-3 Fatty Acids (OMEGA 3 PO) Take 1 capsule by mouth daily.     Polyethyl Glycol-Propyl Glycol (SYSTANE ULTRA OP) Apply 1 drop to eye daily.     Probiotic Product (PROBIOTIC PO)  Take 1 capsule by mouth at bedtime.     Sennosides-Docusate Sodium (SENOKOT S PO) Take 1 tablet by mouth 2 (two) times daily.     Turmeric 500 MG TABS Take 1 tablet by mouth daily.     ZOLOFT  25 MG tablet Take 1 tablet (25 mg total) by mouth daily. 30 tablet 3   No current facility-administered medications on file prior to encounter.   REVIEW OF SYSTEMS:  Review of Systems  Respiratory:  Negative for hemoptysis and shortness of breath.   Cardiovascular:  Positive for leg swelling (LLE). Negative for chest pain and palpitations.  Gastrointestinal:  Negative for blood in stool.  Genitourinary:  Negative for hematuria.  Musculoskeletal:  Negative for falls.  Skin:  Negative for itching.   PHYSICAL EXAMINATION:  Vitals:   10/06/23 1040  BP: (!) 154/67  Pulse: 67  SpO2: 99%   Physical Exam Constitutional:      Appearance: Normal appearance. She is normal weight.  Pulmonary:     Effort: Pulmonary effort is normal.  Musculoskeletal:        General: No tenderness (LLE).     Left lower leg: Edema (mild) present.  Neurological:     Mental Status: She is alert.  Psychiatric:        Mood and Affect: Mood normal.        Behavior: Behavior normal.        Thought Content: Thought content normal.   Villalta Score for Post-Thrombotic Syndrome: Pain: Absent Cramps: Absent Heaviness: Absent Paresthesia: Absent Pruritus: Mild Pretibial Edema: Mild Skin Induration: Absent Hyperpigmentation: Absent Redness: Absent Venous Ectasia: Absent Pain on calf compression: Absent Villalta Preliminary Score: 2 Is venous ulcer present?: No If venous ulcer is present and score is <15, then 15 points total are assigned: Absent Villalta Total Score: 2  LABS:  CBC     Component Value Date/Time   WBC 5.4 04/09/2023 1149   RBC 4.51 04/09/2023 1149   HGB 13.3 04/09/2023 1149   HCT 40.7 04/09/2023 1149   PLT 195.0 04/09/2023 1149   MCV 90.2 04/09/2023 1149   MCH 29.6 07/11/2020 1219   MCHC  32.8 04/09/2023 1149   RDW 14.4 04/09/2023  1149   LYMPHSABS 1.7 04/09/2023 1149   MONOABS 0.5 04/09/2023 1149   EOSABS 0.1 04/09/2023 1149   BASOSABS 0.0 04/09/2023 1149    Hepatic Function      Component Value Date/Time   PROT 7.5 07/31/2022 1143   ALBUMIN 4.3 07/31/2022 1143   AST 22 07/31/2022 1143   ALT 13 07/31/2022 1143   ALKPHOS 39 07/31/2022 1143   BILITOT 0.9 07/31/2022 1143    Renal Function   Lab Results  Component Value Date   CREATININE 0.89 04/09/2023   CREATININE 0.85 07/31/2022   CREATININE 0.99 02/20/2021    CrCl cannot be calculated (Patient's most recent lab result is older than the maximum 21 days allowed.).   VVS Vascular Lab Studies:  07/14/23 VAS US  LOWER EXTREMITY VENOUS LEFT (DVT)  Summary:  RIGHT:  - No evidence of common femoral vein obstruction.    LEFT:  - Findings consistent with acute deep vein thrombosis involving the left  femoral vein, left popliteal vein, left posterior tibial veins, and left  peroneal veins.  - Findings consistent with age indeterminate deep vein thrombosis  involving the left femoral vein.  - No cystic structure found in the popliteal fossa.   ASSESSMENT: Location of DVT: Left femoral vein, Left popliteal vein, Left distal vein Cause of DVT: provoked by a transient risk factor  Pt without hx of VTE presented with a provoked DVT in the setting of COVID infection with multiple days of bedrest with onset of DVT symptoms a couple days later. Pt will complete planned 3 months of anticoagulation next week (estimated date of completion 10/12/23). At low risk of post-thrombotic syndrome given resolution of symptoms. No need for follow up imaging. Pt has been adherent to the medication, without s/sx of worsening DVT or abnormal bleeding/bruising. Discussed purchasing compression stockings that fit her better to help reduce residual swelling. Concerning possible upcoming travel, advised patient/daughter for her to stay hydrated  with water, wear compression stockings, and stay mobile in the airport and on the plane to reduce risk of recurrent VTE. Of note, patient reports that she is not taking ASA 81 mg, which was prescribed by cardiology for aortic dilation.   PLAN: -Stop apixaban  (Eliquis ) 5 mg PO BID after completion of current supply in ~6 days (10/12/23). -Advised patient of medications to avoid (NSAIDs, aspirin  doses >100 mg daily). -Educated that Tylenol (acetaminophen) is the preferred analgesic to lower the risk of bleeding. -Emphasized importance of monitoring for signs and symptoms of bleeding (abnormal bruising, prolonged bleeding, nose bleeds, bleeding from gums, discolored urine, black tarry stools). -Educated patient to present to the ED if emergent signs and symptoms of new thrombosis occur. -Counseled patient to wear compression stockings daily, removing at night. -Patient is discharged from the DVT Clinic. -Counseled patient on future VTE risk reduction strategies and to inform all future providers of DVT history. -Follow-up with cardiology and PCP to assess continued need for ASA 81 mg daily since this has been previously prescribed but the patient reports not currently taking this.   Follow up: PCP 10/08/23. DVT Clinic available as needed.   Lorain Baseman, PharmD PGY1 Pharmacy Resident  Lum Herald, PharmD, BCACP, CPP [facility] Clinical Pharmacist Practitioner Office: [phone]

## 2023-10-06 ENCOUNTER — Ambulatory Visit (HOSPITAL_COMMUNITY)
Admission: RE | Admit: 2023-10-06 | Discharge: 2023-10-06 | Disposition: A | Discharge status: Home / Self Care | Payer: Medicare Other | Source: Ambulatory Visit | Attending: Vascular Surgery | Admitting: Vascular Surgery

## 2023-10-06 ENCOUNTER — Telehealth: Payer: Self-pay | Admitting: Family Medicine

## 2023-10-06 VITALS — BP 154/67 | HR 67

## 2023-10-06 DIAGNOSIS — I82412 Acute embolism and thrombosis of left femoral vein: Secondary | ICD-10-CM

## 2023-10-06 NOTE — Patient Instructions (Signed)
 You have been discharged from the DVT Clinic! No further follow up in the DVT Clinic is needed.   -After you complete your remaining supply of Eliquis , you may stop this medication.  -Your primary care provider, Dr. Mercer, has been informed of your discharge from the DVT Clinic. All future refills, prior authorization, payment assistance, and patient assistance applications/renewals will be the responsibility of your primary care provider.  -It is important that you schedule and attend a follow up visit with your primary care provider within the next couple of months to ensure you do not run out of refills. If this is not already scheduled, we recommend you call to schedule this now.    Please reach out if any questions come up. [phone] [facility].

## 2023-10-06 NOTE — Telephone Encounter (Signed)
 Copied from CRM [id number]. Topic: General - Other >> Oct 06, 2023  2:30 PM Denese Killings wrote: Reason for CRM: Patient daughter is requesting a handicap sticker for Patient

## 2023-10-08 ENCOUNTER — Ambulatory Visit (INDEPENDENT_AMBULATORY_CARE_PROVIDER_SITE_OTHER): Payer: Medicare Other | Admitting: Family Medicine

## 2023-10-08 ENCOUNTER — Encounter: Payer: Self-pay | Admitting: Family Medicine

## 2023-10-08 VITALS — BP 160/80 | HR 67 | Temp 98.2°F | Ht 63.0 in | Wt 162.0 lb

## 2023-10-08 DIAGNOSIS — Z794 Long term (current) use of insulin: Secondary | ICD-10-CM

## 2023-10-08 DIAGNOSIS — I10 Essential (primary) hypertension: Secondary | ICD-10-CM | POA: Diagnosis not present

## 2023-10-08 DIAGNOSIS — E1140 Type 2 diabetes mellitus with diabetic neuropathy, unspecified: Secondary | ICD-10-CM

## 2023-10-08 DIAGNOSIS — I82412 Acute embolism and thrombosis of left femoral vein: Secondary | ICD-10-CM

## 2023-10-08 DIAGNOSIS — F0390 Unspecified dementia without behavioral disturbance: Secondary | ICD-10-CM

## 2023-10-08 DIAGNOSIS — R600 Localized edema: Secondary | ICD-10-CM | POA: Diagnosis not present

## 2023-10-08 NOTE — Progress Notes (Signed)
Established Patient Office Visit   Subjective  Patient ID: Karen Powell, female    DOB: 04/22/1936  Age: 88 y.o. MRN: [id number]  Chief Complaint  Patient presents with   Medical Management of Chronic Issues    3 month follow-up, left lower leg edema, and hypertension   Pt accompanied by her daughter.  Pt is an 88 yo female with pmh sig for dementia, HTN, DM II seen for f/u.  Pt states she has been good.  BSs still up and down. Pt states she is taking meds.  Didn't take bp meds this am.  LLE still with mild edema s/p DVT dx in Oct 2024.  Had f/u with dvt clinic.  Advised to complete 3 month course of Eliquis then monitor.    Patient Active Problem List   Diagnosis Date Noted   Acute deep vein thrombosis (DVT) of femoral vein of left lower extremity (HCC) 07/14/2023   Alzheimer disease (HCC) 08/01/2020   Trigger little finger of left hand 11/25/2018   Gastroesophageal reflux disease 11/25/2018   Palpitations 03/30/2017   Right hip pain 03/16/2017   Chronic fatigue 02/11/2017   Diabetes mellitus (HCC) 12/30/2016   Hyperlipidemia 12/30/2016   Essential hypertension 12/30/2016   Past Medical History:  Diagnosis Date   Arthritis    Blood transfusion without reported diagnosis    Depression    Diabetic neuropathy (HCC)    Dizziness    Frequent headaches    Hyperlipidemia    Hypertension    Migraines    Palpitations    Urinary incontinence    Past Surgical History:  Procedure Laterality Date   none     Social History   Tobacco Use   Smoking status: Never   Smokeless tobacco: Never  Vaping Use   Vaping status: Never Used  Substance Use Topics   Alcohol use: Yes   Drug use: No   Family History  Problem Relation Age of Onset   Diabetes Mother    Hypertension Mother    Diabetes Maternal Grandmother    Diabetes Maternal Grandfather    Allergies  Allergen Reactions   Penicillins Anaphylaxis      ROS Negative unless stated above    Objective:     BP  (!) 160/80 (BP Location: Right Arm, Patient Position: Sitting, Cuff Size: Normal)   Pulse 67   Temp 98.2 F (36.8 C) (Oral)   Ht 5\' 3"  (1.6 m)   Wt 162 lb (73.5 kg)   SpO2 99%   BMI 28.70 kg/m  BP Readings from Last 3 Encounters:  10/08/23 (!) 160/80  10/06/23 (!) 154/67  07/14/23 (!) 164/84   Wt Readings from Last 3 Encounters:  10/08/23 162 lb (73.5 kg)  07/09/23 155 lb 12.8 oz (70.7 kg)  06/22/23 151 lb 9.6 oz (68.8 kg)      Physical Exam Constitutional:      General: She is not in acute distress.    Appearance: Normal appearance.  HENT:     Head: Normocephalic and atraumatic.     Nose: Nose normal.     Mouth/Throat:     Mouth: Mucous membranes are moist.  Cardiovascular:     Rate and Rhythm: Normal rate and regular rhythm.     Heart sounds: Normal heart sounds. No murmur heard.    No gallop.  Pulmonary:     Effort: Pulmonary effort is normal. No respiratory distress.     Breath sounds: Normal breath sounds. No wheezing, rhonchi or rales.  Skin:    General: Skin is warm and dry.  Neurological:     Mental Status: She is alert and oriented to person, place, and time.   Diabetic Foot Exam - Simple   Simple Foot Form Diabetic Foot exam was performed with the following findings: Yes 10/08/2023 11:37 AM  Visual Inspection See comments: Yes Sensation Testing Intact to touch and monofilament testing bilaterally: Yes Pulse Check Posterior Tibialis and Dorsalis pulse intact bilaterally: Yes Comments Hypertrophic toenails.     No results found for any visits on 10/08/23.    Assessment & Plan:  Acute deep vein thrombosis (DVT) of femoral vein of left lower extremity (HCC) -Noted on ultrasound 07/14/2023 -Seen by DVT clinic -As this is patient's first episode of DVT she will complete 71-month course of Eliquis then monitor off medication.  For recurrence restart anticoagulant.  Essential hypertension -Elevated -Patient advised to take medication consistently and  monitor sodium intake -Patient encouraged to schedule follow-up appointment with cardiology. -Continue Toprol XL 50 mg in a.m. and 25 in p.m.,  Losartan-hydrochlorothiazide 100-25 mg daily.  Type 2 diabetes mellitus with diabetic neuropathy, with long-term current use of insulin (HCC) -Stable -Hemoglobin A1c 8.1% on 04/09/23 -Discussed the importance of lifestyle modifications -Hesitant to make major medication changes due to history of dementia.  Also does not like her family "in her business" including helping with meds. -Continue NovoLog 14 units 3 times daily with meals, Basaglar 20 units nightly, and Farxiga 5 mg with breakfast, metformin 500 mg twice daily. -Will wait on hemoglobin A1c recheck as patient was not taking basal insulin due to name change with pharmacy.  Discussed change due to insurance company coverage of equivalent insulin -Recheck A1c at next OFV. -Foot exam done this visit -Patient encouraged to have eye exam in April as last done 12/23/2022.  Dementia without behavioral disturbance (HCC) -Stable -Not currently on medication as declined in the past. -Continue to monitor  Edema of left lower extremity -2/2 DVT noted on Doppler 07/14/2023 -Continue supportive care including elevation, compression socks, etc.  Return in about 4 months (around 02/05/2024).   Deeann Saint, MD

## 2023-10-15 NOTE — Telephone Encounter (Signed)
Discussed in clinic

## 2023-10-22 ENCOUNTER — Telehealth: Payer: Self-pay

## 2023-10-22 NOTE — Progress Notes (Signed)
Care Guide Pharmacy Note  10/22/2023 Name: Karen Powell MRN: [id number] DOB: 05-Sep-1936  Referred By: Deeann Saint, MD Reason for referral: Care Coordination ([facility] Diabetes. )   Karen Powell is a 88 y.o. year old female who is a primary care patient of Salomon Fick, Bettey Mare, MD.  Karen Powell was referred to the pharmacist for assistance related to: DMII  Successful contact was made with the patient to discuss pharmacy services.  Patient declines engagement at this time. Contact information was provided to the patient should they wish to reach out for assistance at a later time.  [facility]  [facility], [facility] Health Care Management Assistant Direct Dial: [phone]  Fax: [phone]

## 2023-10-30 ENCOUNTER — Telehealth: Payer: Self-pay

## 2023-10-30 DIAGNOSIS — E119 Type 2 diabetes mellitus without complications: Secondary | ICD-10-CM

## 2023-10-30 NOTE — Progress Notes (Signed)
 10/30/2023 Name: Karen Powell MRN: [id number] DOB: 1936/01/14  Chief Complaint  Patient presents with   Diabetes   Medication Management    Karen Powell is a 88 y.o. year old female who presented for a telephone visit.   They were referred to the pharmacist for assistance in managing diabetes.    Subjective:  Care Team: Primary Care Provider: Mercer Clotilda SAUNDERS, MD ; Next Scheduled Visit: 02/11/24  Medication Access/Adherence  Current Pharmacy:  [id number] - [address] [place] - [address] [place] [address] Phone: [phone] Fax: [phone]  [facility] [address] [place] [address] Phone: [phone] Fax: [phone]   Patient reports affordability concerns with their medications: No  Patient reports access/transportation concerns to their pharmacy: No  Patient reports adherence concerns with their medications:  No     Diabetes:  Current medications: Farxiga  5, Metformin  500mg  BID, Basaglar  20 units, Novolog  14 units Medications tried in the past: None  Current glucose readings: Not available, conducted call with patient's daughter and she was not sure but says they have been better since restarting insulin    Objective:  Lab Results  Component Value Date   HGBA1C 8.1 (H) 04/09/2023    Lab Results  Component Value Date   CREATININE 0.89 04/09/2023   BUN 22 04/09/2023   NA 140 04/09/2023   K 4.2 04/09/2023   CL 103 04/09/2023   CO2 29 04/09/2023    Lab Results  Component Value Date   CHOL 154 02/20/2021   HDL 47.00 02/20/2021   LDLCALC 82 02/20/2021   TRIG 126.0 02/20/2021   CHOLHDL 3 02/20/2021    Medications Reviewed Today     Reviewed by Lionell Jon DEL, RPH (Pharmacist) on 10/30/23 at 1537  Med List Status: <None>   Medication Order Taking? Sig Documenting Provider Last Dose Status Informant  Alcohol  Swabs  (B-D SINGLE USE SWABS  REGULAR) PADS [id number] No Use  with insulin  pen needles Karen Clotilda SAUNDERS, MD Taking Active   apixaban  (ELIQUIS ) 5 MG TABS tablet [id number] No Take 1 tablet (5 mg total) by mouth 2 (two) times daily. Start taking after completion of starter pack. Barbarann Dixon B, RPH-CPP Taking Active   APIXABAN  (ELIQUIS ) VTE STARTER PACK (10MG  AND 5MG ) [id number] No Take as directed on package: start with two-5mg  tablets twice daily for 7 days. On day 8, switch to one-5mg  tablet twice daily. Barbarann Dixon B, RPH-CPP Taking Active   Apoaequorin (PREVAGEN PO) [id number] No Take 1 capsule by mouth daily. [provider] Taking Active   aspirin  EC 81 MG tablet [id number] No Take 1 tablet (81 mg total) by mouth daily. Claudene Victory ORN, MD Taking Active   Cetirizine HCl (ZYRTEC ALLERGY PO) [id number] No Take 1 tablet by mouth daily. [provider] Taking Active   Coenzyme Q10 (COQ10) 200 MG CAPS [id number] No Take 1 capsule by mouth daily. [provider] Taking Active   Continuous Blood Gluc Sensor (FREESTYLE LIBRE 2 SENSOR) [place]ISC [id number] No Use to check blood sugars. Karen Clotilda SAUNDERS, MD Taking Active   esomeprazole (NEXIUM) 20 MG capsule [id number] No Take 20 mg by mouth daily at 12 noon. [provider] Taking Active   FARXIGA  5 MG TABS tablet [id number] No TAKE 1 TABLET BY MOUTH DAILY BEFORE BREAKFAST. Karen Clotilda SAUNDERS, MD Taking Active   insulin  aspart (NOVOLOG  FLEXPEN) 100 UNIT/ML FlexPen [id number] No INJECT 15 UNITS INTO THE SKIN 3 (THREE) TIMES DAILY WITH MEALS.  Patient  taking differently: 14 units   Karen Clotilda SAUNDERS, MD Taking Active   Insulin  Glargine (BASAGLAR  KWIKPEN) 100 UNIT/ML [id number] No Inject 20 Units into the skin at bedtime. Karen Clotilda SAUNDERS, MD Taking Active   Insulin  Pen Needle (BD PEN NEEDLE MICRO U/F) 32G X 6 MM MISC [id number] No Use to administer insulin  three times daily Karen Clotilda SAUNDERS, MD Taking Active   losartan -hydrochlorothiazide (HYZAAR) 100-25 MG tablet [id number] No Take 1 tablet by  mouth daily. Karen Clotilda SAUNDERS, MD Taking Active   metFORMIN  (GLUCOPHAGE ) 500 MG tablet [id number] No Take 1 tablet (500 mg total) by mouth 2 (two) times daily. Karen Clotilda SAUNDERS, MD Taking Active   Multiple Vitamins-Minerals (CENTRUM PO) [id number] No Take 1 tablet by mouth daily. [provider] Taking Active   Naproxen Sodium (ALEVE PO) [id number] No Take 2 tablets by mouth daily as needed (pain). [provider] Taking Active            Med Note BUELAH, MADISON B   Tue Jul 14, 2023  3:40 PM) Holding while on Eliquis   Omega-3 Fatty Acids (OMEGA 3 PO) [id number] No Take 1 capsule by mouth daily. [provider] Taking Active   Polyethyl Glycol-Propyl Glycol (SYSTANE ULTRA OP) [id number] No Apply 1 drop to eye daily. [provider] Taking Active   Probiotic Product (PROBIOTIC PO) [id number] No Take 1 capsule by mouth at bedtime. [provider] Taking Active   rosuvastatin  (CRESTOR ) 20 MG tablet [id number] No TAKE 1 TABLET BY MOUTH EVERYDAY AT BEDTIME Karen Clotilda SAUNDERS, MD Taking Active   Sennosides-Docusate Sodium (SENOKOT S PO) [id number] No Take 1 tablet by mouth 2 (two) times daily. [provider] Taking Active   TOPROL  XL 25 MG 24 hr tablet [id number] No TAKE 2 TABLETS (50 MG) IN THE MORNING AND 1 TABLET (25 MG) IN THE EVENING Conte, Tessa N, PA-C Taking Active   Turmeric 500 MG TABS [id number] No Take 1 tablet by mouth daily. [provider] Taking Active               Assessment/Plan:   Diabetes: - Currently uncontrolled at last A1C check - Reviewed long term cardiovascular and renal outcomes of uncontrolled blood sugar - Reviewed goal A1c, goal fasting, and goal 2 hour post prandial glucose - Reviewed dietary modifications including low carb diet - Recommend to continue current medication therapy at this time, could consider increasing metformin  or farxiga  if sugars still elevated, if kidney function will allow, at next PCP  visit -Also made aware that freestyle libre 2 sensors are anticipated to be removed from market by end of year and sensors will need to be changed to 3+, they plan to discuss with PCP at next visit     Follow Up Plan: Patient/daughter will call if any needs, per their request  Jon VEAR Lindau, PharmD Clinical Pharmacist [phone]

## 2023-11-03 ENCOUNTER — Telehealth: Payer: Self-pay

## 2023-11-03 NOTE — Telephone Encounter (Signed)
Copied from CRM [id number]. Topic: Clinical - Medication Question >> Nov 03, 2023 10:03 AM Gaetano Hawthorne wrote: Reason for CRM: Patient has been trying to get her prescription for FARXIGA 5 MG TABS tablet filled at her local pharmacy - they only have the 10 mg and they've been trying to get this since late December - Patient just took her last pill yesterday. Patient's daughter would like to know if Dr. Salomon Fick can prescribe something else? Please call patient's daughter.

## 2023-11-04 ENCOUNTER — Telehealth: Payer: Self-pay

## 2023-11-04 NOTE — Telephone Encounter (Signed)
Patient/her daughter can see if the local pharmacy has it available at another store in town.  She could pick up the prescription from them until her regular pharmacy gets it in stock.  The pharmacy can transfer that prescription for her.

## 2023-11-04 NOTE — Telephone Encounter (Signed)
Copied from CRM [id number]. Topic: Clinical - Prescription Issue >> Nov 04, 2023  8:39 AM Prudencio Pair wrote: Reason for CRM: Patient's daughter, Bernadene Bell, called back stating to disregard the last message she left about the Farxiga 5 mg. She stated that it came yesterday.

## 2023-11-04 NOTE — Telephone Encounter (Signed)
Patient has received medication

## 2023-11-30 ENCOUNTER — Other Ambulatory Visit: Payer: Self-pay | Admitting: Family Medicine

## 2023-11-30 DIAGNOSIS — E1140 Type 2 diabetes mellitus with diabetic neuropathy, unspecified: Secondary | ICD-10-CM

## 2023-11-30 NOTE — Telephone Encounter (Signed)
 Copied from CRM [id number]. Topic: General - Other >> Nov 30, 2023 10:56 AM Pascal Lux wrote: Reason for CRM: Patient is requesting a call from Dr. Salomon Fick or her nurse at [phone].

## 2023-12-07 ENCOUNTER — Other Ambulatory Visit: Payer: Self-pay | Admitting: Family Medicine

## 2023-12-07 DIAGNOSIS — E1140 Type 2 diabetes mellitus with diabetic neuropathy, unspecified: Secondary | ICD-10-CM

## 2023-12-31 ENCOUNTER — Other Ambulatory Visit: Payer: Self-pay | Admitting: Family Medicine

## 2023-12-31 DIAGNOSIS — E1140 Type 2 diabetes mellitus with diabetic neuropathy, unspecified: Secondary | ICD-10-CM

## 2023-12-31 NOTE — Telephone Encounter (Signed)
 [facility], confirmed [facility] on [facility]/11. She states they will need to order it and will have her refill ready for her tomorrow. Cancelled reorder and closing encounter.

## 2024-02-11 ENCOUNTER — Encounter: Payer: Self-pay | Admitting: Family Medicine

## 2024-02-11 ENCOUNTER — Ambulatory Visit (INDEPENDENT_AMBULATORY_CARE_PROVIDER_SITE_OTHER): Payer: Medicare Other | Admitting: Family Medicine

## 2024-02-11 VITALS — BP 142/84 | HR 84 | Temp 98.7°F | Ht 63.0 in | Wt 158.8 lb

## 2024-02-11 DIAGNOSIS — I10 Essential (primary) hypertension: Secondary | ICD-10-CM | POA: Diagnosis not present

## 2024-02-11 DIAGNOSIS — Z794 Long term (current) use of insulin: Secondary | ICD-10-CM | POA: Diagnosis not present

## 2024-02-11 DIAGNOSIS — E1140 Type 2 diabetes mellitus with diabetic neuropathy, unspecified: Secondary | ICD-10-CM

## 2024-02-11 DIAGNOSIS — E782 Mixed hyperlipidemia: Secondary | ICD-10-CM | POA: Diagnosis not present

## 2024-02-11 DIAGNOSIS — G309 Alzheimer's disease, unspecified: Secondary | ICD-10-CM | POA: Diagnosis not present

## 2024-02-11 DIAGNOSIS — F0280 Dementia in other diseases classified elsewhere without behavioral disturbance: Secondary | ICD-10-CM

## 2024-02-11 DIAGNOSIS — R600 Localized edema: Secondary | ICD-10-CM | POA: Diagnosis not present

## 2024-02-11 LAB — D-DIMER, QUANTITATIVE: D-Dimer, Quant: 0.6 ug{FEU}/mL — ABNORMAL HIGH (ref <0.50)

## 2024-02-11 LAB — LIPID PANEL
Cholesterol: 157 mg/dL (ref 0–200)
HDL: 50 mg/dL (ref >39.00)
LDL Cholesterol: 85 mg/dL (ref 0–99)
NonHDL: 106.87
Total CHOL/HDL Ratio: 3
Triglycerides: 108 mg/dL (ref 0.0–149.0)
VLDL: 21.6 mg/dL (ref 0.0–40.0)

## 2024-02-11 LAB — COMPREHENSIVE METABOLIC PANEL WITH GFR
ALT: 14 U/L (ref 0–35)
AST: 19 U/L (ref 0–37)
Albumin: 4.4 g/dL (ref 3.5–5.2)
Alkaline Phosphatase: 39 U/L (ref 39–117)
BUN: 26 mg/dL — ABNORMAL HIGH (ref 6–23)
CO2: 27 meq/L (ref 19–32)
Calcium: 11.3 mg/dL — ABNORMAL HIGH (ref 8.4–10.5)
Chloride: 104 meq/L (ref 96–112)
Creatinine, Ser: 0.87 mg/dL (ref 0.40–1.20)
GFR: 60.65 mL/min (ref >60.00)
Glucose, Bld: 169 mg/dL — ABNORMAL HIGH (ref 70–99)
Potassium: 3.9 meq/L (ref 3.5–5.1)
Sodium: 139 meq/L (ref 135–145)
Total Bilirubin: 1 mg/dL (ref 0.2–1.2)
Total Protein: 7.6 g/dL (ref 6.0–8.3)

## 2024-02-11 LAB — MICROALBUMIN / CREATININE URINE RATIO
Creatinine,U: 34.6 mg/dL
Microalb Creat Ratio: UNDETERMINED mg/g (ref 0.0–30.0)
Microalb, Ur: <0.7 mg/dL

## 2024-02-11 LAB — CBC WITH DIFFERENTIAL/PLATELET
Basophils Absolute: 0 10*3/uL (ref 0.0–0.1)
Basophils Relative: 0.8 % (ref 0.0–3.0)
Eosinophils Absolute: 0.1 10*3/uL (ref 0.0–0.7)
Eosinophils Relative: 2.3 % (ref 0.0–5.0)
HCT: 40.1 % (ref 36.0–46.0)
Hemoglobin: 13.2 g/dL (ref 12.0–15.0)
Lymphocytes Relative: 39.1 % (ref 12.0–46.0)
Lymphs Abs: 2.1 10*3/uL (ref 0.7–4.0)
MCHC: 32.9 g/dL (ref 30.0–36.0)
MCV: 89.4 fl (ref 78.0–100.0)
Monocytes Absolute: 0.4 10*3/uL (ref 0.1–1.0)
Monocytes Relative: 7.2 % (ref 3.0–12.0)
Neutro Abs: 2.7 10*3/uL (ref 1.4–7.7)
Neutrophils Relative %: 50.6 % (ref 43.0–77.0)
Platelets: 188 10*3/uL (ref 150.0–400.0)
RBC: 4.48 Mil/uL (ref 3.87–5.11)
RDW: 13.9 % (ref 11.5–15.5)
WBC: 5.3 10*3/uL (ref 4.0–10.5)

## 2024-02-11 LAB — HEMOGLOBIN A1C: Hgb A1c MFr Bld: 9.9 % — ABNORMAL HIGH (ref 4.6–6.5)

## 2024-02-11 LAB — TSH: TSH: 1.15 u[IU]/mL (ref 0.35–5.50)

## 2024-02-11 MED ORDER — DONEPEZIL HCL 5 MG PO TABS: 5.0000 mg | ORAL_TABLET | Freq: Every day | ORAL | 1 refills | Status: DC

## 2024-02-11 NOTE — Progress Notes (Signed)
 Established Patient Office Visit   Subjective  Patient ID: Karen Powell, female    DOB: [birth year]  Age: 88 y.o. MRN: [id number]  No chief complaint on file. Patient alone for visit.  Pt is an 88 yo female seen for f/u.  Patient states she is doing well.  Would like to move back to [place]  where she can walk to different shops, catch a cab, or use public transit to go places.  Patient monitoring blood sugar 4 times per day.  Blood sugar 195 this morning.  Other a.m. blood sugar readings 181, 255, 192, 231, 169, 155, 378, 170, 139, 271, 245, 142, 155.  Patient denies hypoglycemic episodes.  Started taking Prevagen for memory and omega-3 fatty acids.  Has not seen cardiology since Dr. Felipe Horton retired.    Patient Active Problem List   Diagnosis Date Noted   Acute deep vein thrombosis (DVT) of femoral vein of left lower extremity (HCC) 07/14/2023   Alzheimer disease (HCC) 08/01/2020   Trigger little finger of left hand 11/25/2018   Gastroesophageal reflux disease 11/25/2018   Palpitations 03/30/2017   Right hip pain 03/16/2017   Chronic fatigue 02/11/2017   Diabetes mellitus (HCC) 12/30/2016   Hyperlipidemia 12/30/2016   Essential hypertension 12/30/2016   Past Medical History:  Diagnosis Date   Arthritis    Blood transfusion without reported diagnosis    Depression    Diabetic neuropathy (HCC)    Dizziness    Frequent headaches    Hyperlipidemia    Hypertension    Migraines    Palpitations    Urinary incontinence    Past Surgical History:  Procedure Laterality Date   none     Social History   Tobacco Use   Smoking status: Never   Smokeless tobacco: Never  Vaping Use   Vaping status: Never Used  Substance Use Topics   Alcohol  use: Yes   Drug use: No   Family History  Problem Relation Age of Onset   Diabetes Mother    Hypertension Mother    Diabetes Maternal Grandmother    Diabetes Maternal Grandfather    Allergies  Allergen Reactions   Penicillins  Anaphylaxis    ROS Negative unless stated above    Objective:      BP (!) 142/84 (BP Location: Left Arm, Patient Position: Sitting, Cuff Size: Normal)   Pulse 84   Temp 98.7 F (37.1 C) (Oral)   Ht 5\' 3"  (1.6 m)   Wt 158 lb 12.8 oz (72 kg)   SpO2 96%   BMI 28.13 kg/m  BP Readings from Last 3 Encounters:  02/11/24 (!) 142/84  10/08/23 (!) 160/80  10/06/23 (!) 154/67   Wt Readings from Last 3 Encounters:  02/11/24 158 lb 12.8 oz (72 kg)  10/08/23 162 lb (73.5 kg)  07/09/23 155 lb 12.8 oz (70.7 kg)      Physical Exam Constitutional:      General: She is not in acute distress.    Appearance: Normal appearance.  HENT:     Head: Normocephalic and atraumatic.     Nose: Nose normal.     Mouth/Throat:     Mouth: Mucous membranes are moist.  Cardiovascular:     Rate and Rhythm: Normal rate and regular rhythm.     Heart sounds: Normal heart sounds. No murmur heard.    No gallop.  Pulmonary:     Effort: Pulmonary effort is normal. No respiratory distress.     Breath sounds: Normal breath sounds.  No wheezing, rhonchi or rales.  Skin:    General: Skin is warm and dry.  Neurological:     Mental Status: She is alert and oriented to person, place, and time.        05/01/2022   11:39 AM 01/30/2022   11:15 AM 01/10/2022    3:52 PM  Depression screen PHQ 2/9  Decreased Interest 3 3 0  Down, Depressed, Hopeless 2 1 0  PHQ - 2 Score 5 4 0  Altered sleeping 2 2   Tired, decreased energy 1 3   Change in appetite 1 3   Feeling bad or failure about yourself  2 2   Trouble concentrating 0 2   Moving slowly or fidgety/restless 0 3   Suicidal thoughts 0 0   PHQ-9 Score 11 19   Difficult doing work/chores Not difficult at all Somewhat difficult       12/06/2021    3:30 PM 05/09/2021   12:14 PM 11/10/2020    5:46 PM  GAD 7 : Generalized Anxiety Score  Nervous, Anxious, on Edge 0 1 1  Control/stop worrying  1 2  Worry too much - different things  1 1  Trouble relaxing 0 1  1  Restless  1 1  Easily annoyed or irritable  1 2  Afraid - awful might happen 0 1 1  Total GAD 7 Score  7 9  Anxiety Difficulty   Somewhat difficult     No results found for any visits on 02/11/24.    Assessment & Plan:   Type 2 diabetes mellitus with diabetic neuropathy, with long-term current use of insulin  (HCC) -     Microalbumin / creatinine urine ratio -     Hemoglobin A1c; Future -     Lipid panel; Future  Alzheimer disease (HCC) -     Donepezil HCl; Take 1 tablet (5 mg total) by mouth at bedtime.  Dispense: 90 tablet; Refill: 1  Mixed hyperlipidemia -     Comprehensive metabolic panel with GFR; Future -     Lipid panel; Future  Essential hypertension -     CBC with Differential/Platelet; Future -     Comprehensive metabolic panel with GFR; Future -     Lipid panel; Future -     TSH; Future  Hypercalcemia -     Parathyroid hormone, intact (no Ca); Future  Hemoglobin A1c 8.1% on 04/09/2023.  Given history of dementia difficult to further regulate blood sugar.  Try to avoid large changes.  Continue current medications including metformin  500 mg twice daily, Farxiga  5 mg daily, NovoLog  15 units 3 times daily with meals Basaglar  20 units nightly..  Continue ARB and statin.  Patient willing to start donepezil this visit.  Rx sent to pharmacy.  Blood pressure stable.  Continue current medication including losartan -hydrochlorothiazide 100-25 mg daily, Toprol -XL 50 mg in a.m. and 25 in evening.  Patient encouraged to schedule follow-up appointment with cardiology.  Calcium  11.2 on 04/09/2023.  Obtain PTH and updated calcium .   Return in about 3 months (around 05/13/2024).   Viola Greulich, MD

## 2024-02-12 ENCOUNTER — Ambulatory Visit: Payer: Self-pay | Admitting: Family Medicine

## 2024-02-12 LAB — PTH, INTACT AND CALCIUM
Calcium: 11.1 mg/dL — ABNORMAL HIGH (ref 8.6–10.4)
PTH: 19 pg/mL (ref 16–77)

## 2024-02-13 ENCOUNTER — Other Ambulatory Visit: Payer: Self-pay | Admitting: Family Medicine

## 2024-02-13 DIAGNOSIS — E1140 Type 2 diabetes mellitus with diabetic neuropathy, unspecified: Secondary | ICD-10-CM

## 2024-02-24 NOTE — Progress Notes (Signed)
 Error

## 2024-03-18 ENCOUNTER — Telehealth: Payer: Self-pay

## 2024-03-18 NOTE — Progress Notes (Signed)
   03/18/2024  Patient ID: Karen Powell, female   DOB: 1936/03/19, 88 y.o.   MRN: [id number]  Patient is appearing on [facility] DM report for elevated A1c. Contacted patient to follow up on medication and blood sugars. Spoke with daughter and briefly reviewed medications, did not have sugar readings available.  Scheduled follow up appt to do full medication review and follow up on BG. Reminded of recommended diet changes from last PCP visit.  Jon VEAR Lindau, PharmD Clinical Pharmacist [phone]

## 2024-04-01 ENCOUNTER — Telehealth: Payer: Self-pay

## 2024-04-01 NOTE — Telephone Encounter (Signed)
 Copied from CRM [id number]. Topic: Clinical - Prescription Issue >> Apr 01, 2024  2:35 PM Mia F wrote: Reason for CRM: [employer] handles patients glucose monitor reader and they are requesting a chart note to be sent as the one they currently have on file is 6+ months old. Please send to fax [phone]

## 2024-04-01 NOTE — Telephone Encounter (Signed)
 Fax has been received

## 2024-04-01 NOTE — Telephone Encounter (Signed)
 Copied from CRM [id number]. Topic: Clinical - Prescription Issue >> Apr 01, 2024  1:40 PM Rea C wrote: Reason for CRM: Zahara from [employer], [phone] called in to verify if the fax documents were received in regards to patients continuous glucose monitor. She stated she fax the document earlier today and previously on the 5th. She stated if the document is not received within 48hrs, she will give a call back on the 16th.

## 2024-04-04 ENCOUNTER — Other Ambulatory Visit (INDEPENDENT_AMBULATORY_CARE_PROVIDER_SITE_OTHER)

## 2024-04-04 DIAGNOSIS — E1140 Type 2 diabetes mellitus with diabetic neuropathy, unspecified: Secondary | ICD-10-CM

## 2024-04-04 DIAGNOSIS — Z794 Long term (current) use of insulin: Secondary | ICD-10-CM

## 2024-04-04 NOTE — Progress Notes (Signed)
 04/04/2024 Name: Karen Powell MRN: [id number] DOB: [birth year]  Chief Complaint  Patient presents with   Medication Management   Diabetes    Karen Powell is a 88 y.o. year old female who presented for a telephone visit.   They were referred to the pharmacist by [facility] report for assistance in managing diabetes.    Subjective:  Care Team: Primary Care Provider: Mercer Clotilda SAUNDERS, MD ; Next Scheduled Visit: 05-09-2038  Medication Access/Adherence  Current Pharmacy:  [id number] [address], [place] - [address] [place] [address] Phone: [phone] Fax: [phone]  [facility] [address] [place] [address] Phone: [phone] Fax: [phone]   Patient reports affordability concerns with their medications: No  Patient reports access/transportation concerns to their pharmacy: No  Patient reports adherence concerns with their medications:  No     Diabetes:  Current medications: Farxiga  5, Metformin  500mg  BID, Basaglar  20 units BID, Novolog  15 units tid Medications tried in the past: None  Reports sugars are staying elevated, does not feel like basaglar  is working as well as lantus  did (changed due to insurance preferences)   Objective:  Lab Results  Component Value Date   HGBA1C 9.9 (H) 02/11/2024    Lab Results  Component Value Date   CREATININE 0.87 02/11/2024   BUN 26 (H) 02/11/2024   NA 139 02/11/2024   K 3.9 02/11/2024   CL 104 02/11/2024   CO2 27 02/11/2024    Lab Results  Component Value Date   CHOL 157 02/11/2024   HDL 50.00 02/11/2024   LDLCALC 85 02/11/2024   TRIG 108.0 02/11/2024   CHOLHDL 3 02/11/2024    Medications Reviewed Today     Reviewed by Lionell Jon DEL, RPH (Pharmacist) on 04/04/24 at 1135  Med List Status: <None>   Medication Order Taking? Sig Documenting Provider Last Dose Status Informant  Alcohol  Swabs  (B-D SINGLE USE SWABS  REGULAR) PADS [id number]   Use with insulin  pen needles Mercer Clotilda SAUNDERS, MD  Active   apixaban  (ELIQUIS ) 5 MG TABS tablet [id number] Yes Take 1 tablet (5 mg total) by mouth 2 (two) times daily. Start taking after completion of starter pack. Barbarann Dixon B, RPH-CPP  Active   APIXABAN  (ELIQUIS ) VTE STARTER PACK (10MG  AND 5MG ) [id number]  Take as directed on package: start with two-5mg  tablets twice daily for 7 days. On day 8, switch to one-5mg  tablet twice daily. Barbarann Dixon NOVAK, RPH-CPP  Active   Apoaequorin (PREVAGEN PO) [id number]  Take 1 capsule by mouth daily. [provider]  Active   aspirin  EC 81 MG tablet [id number]  Take 1 tablet (81 mg total) by mouth daily.  Patient not taking: Reported on 04/04/2024   Claudene Victory ORN, MD  Active   BD PEN NEEDLE MICRO U/F 32G X 6 MM MISC [id number]  USE TO ADMINISTER INSULIN  THREE TIMES DAILY Mercer Clotilda SAUNDERS, MD  Active   Cetirizine HCl (ZYRTEC ALLERGY PO) [id number]  Take 1 tablet by mouth daily. [provider]  Active   Coenzyme Q10 (COQ10) 200 MG CAPS [id number] Yes Take 1 capsule by mouth daily. [provider]  Active   Continuous Blood Gluc Sensor (FREESTYLE LIBRE 2 SENSOR) [place] [id number] Yes Use to check blood sugars. Mercer Clotilda SAUNDERS, MD  Active   donepezil  (ARICEPT ) 5 MG tablet [id number]  Take 1 tablet (5 mg total) by mouth at bedtime. Mercer Clotilda SAUNDERS, MD  Active   esomeprazole (NEXIUM) 20 MG  capsule [id number] Yes Take 20 mg by mouth daily at 12 noon. [provider]  Active   FARXIGA  5 MG TABS tablet [id number] Yes TAKE 1 TABLET BY MOUTH EVERY DAY BEFORE BREAKFAST Mercer Clotilda SAUNDERS, MD  Active   insulin  aspart (NOVOLOG  FLEXPEN) 100 UNIT/ML FlexPen [id number] Yes INJECT 15 UNITS INTO THE SKIN 3 (THREE) TIMES DAILY WITH MEALS. Mercer Clotilda SAUNDERS, MD  Active   Insulin  Glargine (BASAGLAR  KWIKPEN) 100 UNIT/ML [id number] Yes Inject 20 Units into the skin at bedtime.  Patient taking differently: Inject 20 Units into the skin 2 (two) times daily.    Mercer Clotilda SAUNDERS, MD  Active   losartan -hydrochlorothiazide (HYZAAR) 100-25 MG tablet [id number] Yes Take 1 tablet by mouth daily. Mercer Clotilda SAUNDERS, MD  Active   metFORMIN  (GLUCOPHAGE ) 500 MG tablet [id number] Yes TAKE 1 TABLET BY MOUTH TWICE A DAY Mercer Clotilda SAUNDERS, MD  Active   Multiple Vitamins-Minerals (CENTRUM PO) [id number] Yes Take 1 tablet by mouth daily. [provider]  Active   Naproxen Sodium (ALEVE PO) [id number] Yes Take 2 tablets by mouth daily as needed (pain). [provider]  Active            Med Note BUELAH, MADISON B   Tue Jul 14, 2023  3:40 PM) Holding while on Eliquis   Omega-3 Fatty Acids (OMEGA 3 PO) [id number] Yes Take 1 capsule by mouth daily. [provider]  Active   Polyethyl Glycol-Propyl Glycol (SYSTANE ULTRA OP) [id number] Yes Apply 1 drop to eye daily. [provider]  Active   Probiotic Product (PROBIOTIC PO) [id number] Yes Take 1 capsule by mouth at bedtime. [provider]  Active   rosuvastatin  (CRESTOR ) 20 MG tablet [id number] Yes TAKE 1 TABLET BY MOUTH EVERYDAY AT BEDTIME Mercer Clotilda SAUNDERS, MD  Active   Sennosides-Docusate Sodium (SENOKOT S PO) [id number] Yes Take 1 tablet by mouth 2 (two) times daily. [provider]  Active   TOPROL  XL 25 MG 24 hr tablet [id number] Yes TAKE 2 TABLETS (50 MG) IN THE MORNING AND 1 TABLET (25 MG) IN THE EVENING Conte, Tessa N, PA-C  Active   Turmeric 500 MG TABS [id number]  Take 1 tablet by mouth daily. [provider]  Active               Assessment/Plan:   Diabetes: - Currently uncontrolled at last A1C check - Reviewed long term cardiovascular and renal outcomes of uncontrolled blood sugar - Reviewed goal A1c, goal fasting, and goal 2 hour post prandial glucose - Reviewed dietary modifications including low carb diet - Recommend to continue current medication therapy at this time, could consider increasing metformin  or farxiga  if sugars still elevated, if kidney  function will allow, at next PCP visit  Counseled not to take aleve or other NSAIDs while taking Eliquis . Recommend OTC tylenol for pain relief instead.  Patient declines to discuss any therapy changes related to her DM until she meets with Dr. Mercer,    Follow Up Plan: Patient/daughter will call if any needs, per their request  Jon VEAR Lindau, PharmD Clinical Pharmacist [phone]

## 2024-04-08 ENCOUNTER — Telehealth: Payer: Self-pay

## 2024-04-08 NOTE — Progress Notes (Signed)
   04/08/2024  Patient ID: Karen Powell, female   DOB: [birth year], 88 y.o.   MRN: [id number]  Received request via [employer] portal from [employer]. Requested documentation for patient's last DM visit with PCP. Submitted information, prescription processing for [employer] devices.  Jon VEAR Lindau, PharmD Clinical Pharmacist [phone]

## 2024-04-29 ENCOUNTER — Other Ambulatory Visit: Payer: Self-pay | Admitting: Family Medicine

## 2024-04-29 DIAGNOSIS — Z23 Encounter for immunization: Secondary | ICD-10-CM | POA: Diagnosis not present

## 2024-04-29 DIAGNOSIS — E1140 Type 2 diabetes mellitus with diabetic neuropathy, unspecified: Secondary | ICD-10-CM

## 2024-05-12 ENCOUNTER — Encounter: Payer: Self-pay | Admitting: Family Medicine

## 2024-05-12 ENCOUNTER — Ambulatory Visit: Admitting: Family Medicine

## 2024-05-12 VITALS — BP 170/84 | HR 55 | Temp 98.4°F | Ht 63.0 in | Wt 160.4 lb

## 2024-05-12 DIAGNOSIS — Z794 Long term (current) use of insulin: Secondary | ICD-10-CM | POA: Diagnosis not present

## 2024-05-12 DIAGNOSIS — Z86718 Personal history of other venous thrombosis and embolism: Secondary | ICD-10-CM | POA: Diagnosis not present

## 2024-05-12 DIAGNOSIS — E1140 Type 2 diabetes mellitus with diabetic neuropathy, unspecified: Secondary | ICD-10-CM | POA: Diagnosis not present

## 2024-05-12 DIAGNOSIS — F0390 Unspecified dementia without behavioral disturbance: Secondary | ICD-10-CM | POA: Diagnosis not present

## 2024-05-12 DIAGNOSIS — R0982 Postnasal drip: Secondary | ICD-10-CM | POA: Diagnosis not present

## 2024-05-12 LAB — POCT GLYCOSYLATED HEMOGLOBIN (HGB A1C): Hemoglobin A1C: 9 % — AB (ref 4.0–5.6)

## 2024-05-12 NOTE — Progress Notes (Signed)
 Established Patient Office Visit   Subjective  Patient ID: Karen Powell, female    DOB: Oct 08, 1935  Age: 88 y.o. MRN: [id number]  Chief Complaint  Patient presents with   Medical Management of Chronic Issues    Patient came in today for 3 month follow-up for Diabetes and Blood pressure     Pt is an 88 year old female seen for follow-up on chronic conditions.  Patient's daughter brought her to the appt, however, pt prefers not to have her actively involved in her care.  Continuing to have high bs readings.  This am bs 206.  Last night 310. Taking Novolog  15 u TID with meals.  Taking basaglar  20 units in am and 30 units when her blood sugar is particularly high. Does not feel like Basaglar  is effective.   Her diet mainly consists of vegetables, beans, and occasionally corned beef. She avoids sugar and salt, using the sodium in canned beans as seasoning. She occasionally eats grapefruit and plums and consumes 3 slices of wheat bread with 'I can't believe it's butter' for breakfast.   She has a history of DVT in b/l legs. Was on Eliquis  for a few months but has since stopped taking it. She reports that one leg is always larger than the other, but did not report current pain or new swelling.  Pt notes issues with her pharmacy as she prefers 90 d supply but has been getting 30 d supply.  Pt's insurance prefers 90d meds be obtained through mail-order which pt has not liked in the past.   Patient notes chest congestion.  OTC cough syrup does not seem to help break up the phlegm.  Phlegm is clear.  Brought in a sample in an old medicine bottle and inquires if lab can run testing on it. She spends most of her time at home, watching television and listening to [employer]. She does not engage in much physical activity and prefers to stay indoors.    Patient Active Problem List   Diagnosis Date Noted   Acute deep vein thrombosis (DVT) of femoral vein of left lower extremity (HCC) 07/14/2023    Alzheimer disease (HCC) 08/01/2020   Trigger little finger of left hand 11/25/2018   Gastroesophageal reflux disease 11/25/2018   Palpitations 03/30/2017   Right hip pain 03/16/2017   Chronic fatigue 02/11/2017   Diabetes mellitus (HCC) 12/30/2016   Hyperlipidemia 12/30/2016   Essential hypertension 12/30/2016   Past Medical History:  Diagnosis Date   Arthritis    Blood transfusion without reported diagnosis    Depression    Diabetic neuropathy (HCC)    Dizziness    Frequent headaches    Hyperlipidemia    Hypertension    Migraines    Palpitations    Urinary incontinence    Past Surgical History:  Procedure Laterality Date   none     Social History   Tobacco Use   Smoking status: Never   Smokeless tobacco: Never  Vaping Use   Vaping status: Never Used  Substance Use Topics   Alcohol  use: Yes   Drug use: No   Family History  Problem Relation Age of Onset   Diabetes Mother    Hypertension Mother    Diabetes Maternal Grandmother    Diabetes Maternal Grandfather    Allergies  Allergen Reactions   Penicillins Anaphylaxis    ROS Negative unless stated above    Objective:     BP (!) 170/84 (BP Location: Right Arm, Patient Position: Sitting, Cuff  Size: Normal)   Pulse (!) 55   Temp 98.4 F (36.9 C) (Oral)   Ht 5' 3 (1.6 m)   Wt 160 lb 6.4 oz (72.8 kg)   SpO2 99%   BMI 28.41 kg/m  BP Readings from Last 3 Encounters:  05/12/24 (!) 170/84  02/11/24 (!) 142/84  10/08/23 (!) 160/80   Wt Readings from Last 3 Encounters:  05/12/24 160 lb 6.4 oz (72.8 kg)  02/11/24 158 lb 12.8 oz (72 kg)  10/08/23 162 lb (73.5 kg)      Physical Exam Constitutional:      General: She is not in acute distress.    Appearance: Normal appearance.  HENT:     Head: Normocephalic and atraumatic.     Nose: Nose normal.     Mouth/Throat:     Mouth: Mucous membranes are moist.  Cardiovascular:     Rate and Rhythm: Normal rate and regular rhythm.     Heart sounds: Normal  heart sounds. No murmur heard.    No gallop.     Comments: LLE >RLE.  Non pitting edema.    Pulmonary:     Effort: Pulmonary effort is normal. No respiratory distress.     Breath sounds: Normal breath sounds. No wheezing, rhonchi or rales.  Musculoskeletal:     Left lower leg: Edema present.  Skin:    General: Skin is warm and dry.  Neurological:     Mental Status: She is alert and oriented to person, place, and time.        05/12/2024   11:30 AM 05/01/2022   11:39 AM 01/30/2022   11:15 AM  Depression screen PHQ 2/9  Decreased Interest  3 3  Down, Depressed, Hopeless 2 2 1   PHQ - 2 Score 2 5 4   Altered sleeping 1 2 2   Tired, decreased energy 1 1 3   Change in appetite 0 1 3  Feeling bad or failure about yourself  2 2 2   Trouble concentrating 2 0 2  Moving slowly or fidgety/restless 2 0 3  Suicidal thoughts 0 0 0  PHQ-9 Score 10 11 19   Difficult doing work/chores Not difficult at all Not difficult at all Somewhat difficult      05/12/2024   11:30 AM 12/06/2021    3:30 PM 05/09/2021   12:14 PM 11/10/2020    5:46 PM  GAD 7 : Generalized Anxiety Score  Nervous, Anxious, on Edge 0 0 1 1  Control/stop worrying 1  1 2   Worry too much - different things 1  1 1   Trouble relaxing 2 0 1 1  Restless 0  1 1  Easily annoyed or irritable 1  1 2   Afraid - awful might happen 0 0 1 1  Total GAD 7 Score 5  7 9   Anxiety Difficulty Not difficult at all   Somewhat difficult     Results for orders placed or performed in visit on 05/12/24  POC HgB A1c  Result Value Ref Range   Hemoglobin A1C 9.0 (A) 4.0 - 5.6 %   HbA1c POC (<> result, manual entry)     HbA1c, POC (prediabetic range)     HbA1c, POC (controlled diabetic range)        Assessment & Plan:   Type 2 diabetes mellitus with diabetic neuropathy, with long-term current use of insulin  (HCC) -     POCT glycosylated hemoglobin (Hb A1C)  Dementia without behavioral disturbance (HCC)  Post-nasal drainage  History of DVT (deep  vein thrombosis)     DM 2 poorly controlled.  Hemoglobin A1c previously 9.9% 02/11/2024.  Some improvement as now with an A1c of 9.0% this visit. Concerns for pt becoming confused about dosing of insulin  and her self adjusting dosing which could potentially lead to hypoglycemia.  Will continue to have clinic pharmacist, Jon reach out to pt to help with medication management, etc. Dietary changes discussed. The importance of taking insulin  with meals to prevent hypoglycemia was discussed.   Pt's daughter made aware of medication/insulin  concerns.  History of lower extremity deep vein thrombosis   Completed 67-month course of Eliquis .  The risk of recurrence and potential need for lifelong anticoagulation if recurrence occurs was discussed.  Postnasal drainage   postnasal drainage due to seasonal allergies likely causing congestion.  OTC antihistamine such as Allegra as needed.  Dementia Pt declines medication/workup.  Currently resides with her granddaughter who helps care for her.  Pt does not like her daughters to be involved but they are aware.  Return in about 3 months (around 08/12/2024).   I personally spent a total of 44 minutes in the care of the patient today including getting/reviewing separately obtained history, performing a medically appropriate exam/evaluation, counseling and educating, placing orders, referring and communicating with other health care professionals, documenting clinical information in the EHR, independently interpreting results, communicating results, and coordinating care.   Clotilda JONELLE Single, MD

## 2024-05-16 ENCOUNTER — Telehealth: Payer: Self-pay | Admitting: Family Medicine

## 2024-05-16 ENCOUNTER — Telehealth: Payer: Self-pay

## 2024-05-16 DIAGNOSIS — E119 Type 2 diabetes mellitus without complications: Secondary | ICD-10-CM

## 2024-05-16 NOTE — Telephone Encounter (Signed)
 Copied from CRM #[id number]. Topic: General - Other >> May 16, 2024  1:52 PM Patil H wrote: Reason for CRM: Patients daughter Barnie calling for Jon the Pharmacist, states that her mom will not be able to call her back today like she said. Daughter states she just got some bad news about one of the patients children that she has to go tell Zendaya about and will not be able to speak with Jon today afterwards. Asking if she can give them 24-48 hours and they will reach back out.  Barnie Bunker [phone]

## 2024-05-16 NOTE — Progress Notes (Signed)
   05/16/2024  Patient ID: Ronal Essex, female   DOB: 1936-05-13, 88 y.o.   MRN: [id number]  Attempted to contact patient to schedule appointment for medication management. Spoke with daughter. Daughter was not with patient at that time and stated she would call patient, provide her with my callback number to reach me to schedule appt for med review.  Jon VEAR Lindau, PharmD Clinical Pharmacist [phone]

## 2024-05-20 ENCOUNTER — Other Ambulatory Visit

## 2024-05-20 NOTE — Telephone Encounter (Signed)
 Contacted patient. Scheduled in office visit for 05/30/24

## 2024-05-30 ENCOUNTER — Ambulatory Visit

## 2024-06-06 ENCOUNTER — Ambulatory Visit (INDEPENDENT_AMBULATORY_CARE_PROVIDER_SITE_OTHER)

## 2024-06-06 DIAGNOSIS — E1140 Type 2 diabetes mellitus with diabetic neuropathy, unspecified: Secondary | ICD-10-CM

## 2024-06-06 DIAGNOSIS — Z794 Long term (current) use of insulin: Secondary | ICD-10-CM | POA: Diagnosis not present

## 2024-06-06 NOTE — Progress Notes (Signed)
 06/06/2024 Name: Reginald Mangels MRN: [id number] DOB: [birth year]  Chief Complaint  Patient presents with   Medication Management   Diabetes    Eniyah Eastmond is a 88 y.o. year old female who presented for a telephone visit.   They were referred to the pharmacist by TNM report for assistance in managing diabetes.    Subjective:  Care Team: Primary Care Provider: Mercer Clotilda SAUNDERS, MD ; Next Scheduled Visit: 08/11/24  Medication Access/Adherence  Current Pharmacy:  [id number] - [address] [place] - [address] [place] [address] Phone: [phone] Fax: [phone]  [facility] [address] [place] [address] Phone: [phone] Fax: [phone]   Patient reports affordability concerns with their medications: No  Patient reports access/transportation concerns to their pharmacy: No  Patient reports adherence concerns with their medications:  No     Diabetes:  Current medications: Farxiga  5, Metformin  500mg  BID, Basaglar  20 units BID, Novolog  15 units tid Medications tried in the past: None  Self adjusts insulins based on sugars. Will increase Basaglar  to 26 units if she feels she needs it and will skip her morning dose of Basaglar  if morning sugars are below 140-150. Sometimes gives Novolog  at 18 units  Reviewed libre sensor and sugar log readings 7 day avg 165 14 day avg 174 30 day avg 189 90 day avg 202  Sugar readings are trending down and improving  Objective:  Lab Results  Component Value Date   HGBA1C 9.0 (A) 05/12/2024    Lab Results  Component Value Date   CREATININE 0.87 02/11/2024   BUN 26 (H) 02/11/2024   NA 139 02/11/2024   K 3.9 02/11/2024   CL 104 02/11/2024   CO2 27 02/11/2024    Lab Results  Component Value Date   CHOL 157 02/11/2024   HDL 50.00 02/11/2024   LDLCALC 85 02/11/2024   TRIG 108.0 02/11/2024   CHOLHDL 3 02/11/2024    Medications Reviewed Today      Reviewed by Lionell Jon DEL, RPH (Pharmacist) on 06/06/24 at 1213  Med List Status: <None>   Medication Order Taking? Sig Documenting Provider Last Dose Status Informant  Alcohol  Swabs  (B-D SINGLE USE SWABS  REGULAR) PADS [id number] Yes Use with insulin  pen needles Mercer Clotilda SAUNDERS, MD  Active   Apoaequorin (PREVAGEN PO) [id number] Yes Take 1 capsule by mouth daily. [provider]  Active   aspirin  EC 81 MG tablet [id number] Yes Take 1 tablet (81 mg total) by mouth daily. Claudene Victory ORN, MD  Active   BD PEN NEEDLE MICRO ULTRAFINE 32G X 6 MM MISC [id number] Yes USE TO ADMINISTER INSULIN  THREE TIMES DAILY Mercer Clotilda SAUNDERS, MD  Active   Cetirizine HCl (ZYRTEC ALLERGY PO) [id number] Yes Take 1 tablet by mouth daily. [provider]  Active   Coenzyme Q10 (COQ10) 200 MG CAPS [id number] Yes Take 1 capsule by mouth daily. [provider]  Active   Continuous Blood Gluc Sensor (FREESTYLE LIBRE 2 SENSOR) [place] [id number] Yes Use to check blood sugars. Mercer Clotilda SAUNDERS, MD  Active   donepezil  (ARICEPT ) 5 MG tablet [id number] Yes Take 1 tablet (5 mg total) by mouth at bedtime. Mercer Clotilda SAUNDERS, MD  Active   esomeprazole (NEXIUM) 20 MG capsule [id number] Yes Take 20 mg by mouth daily at 12 noon. [provider]  Active   FARXIGA  5 MG TABS tablet [id number] Yes TAKE 1 TABLET BY MOUTH EVERY DAY BEFORE BREAKFAST Mercer, Clotilda SAUNDERS,  MD  Active   insulin  aspart (NOVOLOG  FLEXPEN) 100 UNIT/ML FlexPen [id number] Yes INJECT 15 UNITS INTO THE SKIN 3 (THREE) TIMES DAILY WITH MEALS. Mercer Clotilda SAUNDERS, MD  Active   Insulin  Glargine (BASAGLAR  KWIKPEN) 100 UNIT/ML [id number] Yes Inject 20 Units into the skin at bedtime.  Patient taking differently: Inject 20 Units into the skin 2 (two) times daily.   Mercer Clotilda SAUNDERS, MD  Active   losartan -hydrochlorothiazide (HYZAAR) 100-25 MG tablet [id number] Yes Take 1 tablet by mouth daily. Mercer Clotilda SAUNDERS, MD  Active   metFORMIN  (GLUCOPHAGE ) 500 MG tablet  [id number] Yes TAKE 1 TABLET BY MOUTH TWICE A DAY Mercer Clotilda SAUNDERS, MD  Active   Multiple Vitamins-Minerals (CENTRUM PO) [id number] Yes Take 1 tablet by mouth daily. [provider]  Active   Naproxen Sodium (ALEVE PO) [id number] Yes Take 2 tablets by mouth daily as needed (pain). [provider]  Active            Med Note BUELAH, MADISON B   Tue Jul 14, 2023  3:40 PM) Holding while on Eliquis   Omega-3 Fatty Acids (OMEGA 3 PO) [id number] Yes Take 1 capsule by mouth daily. [provider]  Active   Polyethyl Glycol-Propyl Glycol (SYSTANE ULTRA OP) [id number] Yes Apply 1 drop to eye daily. [provider]  Active   Probiotic Product (PROBIOTIC PO) [id number] Yes Take 1 capsule by mouth at bedtime. [provider]  Active   rosuvastatin  (CRESTOR ) 20 MG tablet [id number] Yes TAKE 1 TABLET BY MOUTH EVERYDAY AT BEDTIME Mercer Clotilda SAUNDERS, MD  Active   Sennosides-Docusate Sodium (SENOKOT S PO) [id number] Yes Take 1 tablet by mouth 2 (two) times daily. [provider]  Active   TOPROL  XL 25 MG 24 hr tablet [id number] Yes TAKE 2 TABLETS (50 MG) IN THE MORNING AND 1 TABLET (25 MG) IN THE EVENING Conte, Tessa N, PA-C  Active   Turmeric 500 MG TABS [id number] Yes Take 1 tablet by mouth daily. [provider]  Active               Assessment/Plan:   Diabetes: - Currently uncontrolled at last A1C check - Reviewed long term cardiovascular and renal outcomes of uncontrolled blood sugar - Reviewed goal A1c, goal fasting, and goal 2 hour post prandial glucose - Reviewed dietary modifications including low carb diet - Recommend to continue current medication therapy at this time, could consider increasing metformin  or farxiga  if sugars still elevated, if kidney function will allow, at next PCP visit -Formulary check shows toujeo  would be no charge if patient continues to voice that basaglar  does not work as well    Follow Up Plan: 07/04/24  Jon VEAR Lindau, PharmD Clinical Pharmacist [phone]

## 2024-06-17 ENCOUNTER — Telehealth: Payer: Self-pay | Admitting: Pharmacy Technician

## 2024-06-17 NOTE — Progress Notes (Signed)
   06/17/2024  Patient ID: Karen Powell, female   DOB: Feb 15, 1936, 88 y.o.   MRN: [id number]  Patient engaged with clinical pharmacist for management of diabetes on 06/06/2024. Outreach by [employer] technician was requested.   Outreached patient to discuss diabetes medication management. Left voicemail for patient to return my call at their convenience.   Hensley Treat, CPhT [facility] Office: [phone] Email: [email or web address]

## 2024-06-22 ENCOUNTER — Other Ambulatory Visit: Payer: Self-pay

## 2024-07-01 ENCOUNTER — Other Ambulatory Visit: Payer: Self-pay | Admitting: Family Medicine

## 2024-07-01 ENCOUNTER — Other Ambulatory Visit: Payer: Self-pay

## 2024-07-01 DIAGNOSIS — E1140 Type 2 diabetes mellitus with diabetic neuropathy, unspecified: Secondary | ICD-10-CM

## 2024-07-04 ENCOUNTER — Other Ambulatory Visit (INDEPENDENT_AMBULATORY_CARE_PROVIDER_SITE_OTHER)

## 2024-07-04 DIAGNOSIS — Z794 Long term (current) use of insulin: Secondary | ICD-10-CM

## 2024-07-04 DIAGNOSIS — E1140 Type 2 diabetes mellitus with diabetic neuropathy, unspecified: Secondary | ICD-10-CM

## 2024-07-04 NOTE — Progress Notes (Signed)
 07/04/2024 Name: Hartleigh Edmonston MRN: [id number] DOB: [birth year]  Chief Complaint  Patient presents with   Medication Management   Diabetes    Shevaun Lovan is a 88 y.o. year old female who presented for a telephone visit.   They were referred to the pharmacist by TNM report for assistance in managing diabetes.    Subjective:  Care Team: Primary Care Provider: Mercer Clotilda SAUNDERS, MD ; Next Scheduled Visit: 08/11/24  Medication Access/Adherence  Current Pharmacy:  [id number] - [address], [place] - [address] [place] [address] Phone: [phone] Fax: [phone]  [facility] [address] [place] [address] Phone: [phone] Fax: [phone]   Patient reports affordability concerns with their medications: No  Patient reports access/transportation concerns to their pharmacy: No  Patient reports adherence concerns with their medications:  No     Diabetes:  Current medications: Farxiga  5, Metformin  500mg  BID, Basaglar  20 units BID, Novolog  15 units tid Medications tried in the past: None  Self adjusts insulins based on sugars. Will increase Basaglar  to 26 units if she feels she needs it and will skip her morning dose of Basaglar  if morning sugars are below 140-150. Sometimes gives Novolog  at 18 units  Current Sugar Readings: Very elevates since last visit. 153 fasting this morning, most readings staying above 200 in the last week and per patient, in the last month  Admits to a lot of stress at home and reports this raises her sugars. States she is not going to really change how she eats  Objective:  Lab Results  Component Value Date   HGBA1C 9.0 (A) 05/12/2024    Lab Results  Component Value Date   CREATININE 0.87 02/11/2024   BUN 26 (H) 02/11/2024   NA 139 02/11/2024   K 3.9 02/11/2024   CL 104 02/11/2024   CO2 27 02/11/2024    Lab Results  Component Value Date   CHOL 157  02/11/2024   HDL 50.00 02/11/2024   LDLCALC 85 02/11/2024   TRIG 108.0 02/11/2024   CHOLHDL 3 02/11/2024    Medications Reviewed Today     Reviewed by Lionell Jon DEL, RPH (Pharmacist) on 07/04/24 at 1521  Med List Status: <None>   Medication Order Taking? Sig Documenting Provider Last Dose Status Informant  acetaminophen (TYLENOL) 500 MG tablet [id number]  Take 500 mg by mouth 2 (two) times daily as needed. [provider]  Active   Alcohol  Swabs  (B-D SINGLE USE SWABS  REGULAR) PADS [id number]  Use with insulin  pen needles Mercer Clotilda SAUNDERS, MD  Active   Apoaequorin (PREVAGEN PO) [id number]  Take 1 capsule by mouth daily. [provider]  Active   BD PEN NEEDLE MICRO ULTRAFINE 32G X 6 MM MISC [id number]  USE TO ADMINISTER INSULIN  THREE TIMES DAILY Mercer Clotilda SAUNDERS, MD  Active   Biotin 800 MCG TABS [id number]  Take 800 mcg by mouth daily at 2 PM. [provider]  Active   Cetirizine HCl (ZYRTEC ALLERGY PO) [id number]  Take 1 tablet by mouth daily. [provider]  Active   Coenzyme Q10 (COQ10) 200 MG CAPS [id number]  Take 1 capsule by mouth daily. [provider]  Active   Continuous Blood Gluc Sensor (FREESTYLE LIBRE 2 SENSOR) [place] [id number]  Use to check blood sugars. Mercer Clotilda SAUNDERS, MD  Active   Cranberry-Vitamin C (AZO CRANBERRY URINARY TRACT PO) [id number]  Take 1 tablet by mouth daily at 2 PM. [provider]  Active   donepezil  (ARICEPT ) 5 MG tablet [id number]  Take 1 tablet (5 mg total) by mouth at bedtime. Mercer Clotilda SAUNDERS, MD  Active   esomeprazole (NEXIUM) 20 MG capsule [id number]  Take 20 mg by mouth daily at 12 noon. [provider]  Active   FARXIGA  5 MG TABS tablet [id number]  TAKE 1 TABLET BY MOUTH EVERY DAY BEFORE BREAKFAST Mercer Clotilda SAUNDERS, MD  Active   Ginger, Zingiber officinalis, (GINGER ROOT PO) [id number]  Take 1 tablet by mouth daily at 2 PM. [provider]  Active   insulin  aspart (NOVOLOG  FLEXPEN) 100  UNIT/ML FlexPen [id number]  INJECT 15 UNITS INTO THE SKIN 3 (THREE) TIMES DAILY WITH MEALS. Mercer Clotilda SAUNDERS, MD  Active   Insulin  Glargine (BASAGLAR  KWIKPEN) 100 UNIT/ML [id number]  Inject 20 Units into the skin at bedtime.  Patient taking differently: Inject 20 Units into the skin 2 (two) times daily.   Mercer Clotilda SAUNDERS, MD  Active   losartan -hydrochlorothiazide (HYZAAR) 100-25 MG tablet [id number]  Take 1 tablet by mouth daily. Mercer Clotilda SAUNDERS, MD  Active   metFORMIN  (GLUCOPHAGE ) 500 MG tablet [id number]  TAKE 1 TABLET BY MOUTH TWICE A DAY Mercer Clotilda SAUNDERS, MD  Active   Misc Natural Products (BEET ROOT) 500 MG CAPS [id number]  Take 500 mg by mouth daily. [provider]  Active   Multiple Vitamins-Minerals (CENTRUM PO) [id number]  Take 1 tablet by mouth daily. [provider]  Active   Naproxen Sodium (ALEVE PO) [id number]  Take 2 tablets by mouth daily as needed (pain). [provider]  Active            Med Note BUELAH, MADISON B   Tue Jul 14, 2023  3:40 PM) Holding while on Eliquis   Omega-3 Fatty Acids (OMEGA 3 PO) [id number]  Take 1 capsule by mouth daily. [provider]  Active   Polyethyl Glycol-Propyl Glycol (SYSTANE ULTRA OP) [id number]  Apply 1 drop to eye daily. [provider]  Active   Probiotic Product (PROBIOTIC PO) [id number]  Take 1 capsule by mouth at bedtime. [provider]  Active   rosuvastatin  (CRESTOR ) 20 MG tablet [id number]  TAKE 1 TABLET BY MOUTH EVERYDAY AT BEDTIME Mercer Clotilda SAUNDERS, MD  Active   Sennosides-Docusate Sodium (SENOKOT S PO) [id number]  Take 1 tablet by mouth 2 (two) times daily. [provider]  Active   TOPROL  XL 25 MG 24 hr tablet [id number]  TAKE 2 TABLETS (50 MG) IN THE MORNING AND 1 TABLET (25 MG) IN THE EVENING Conte, Tessa N, PA-C  Active   Turmeric 500 MG TABS [id number]  Take 1 tablet by mouth daily. [provider]  Active               Assessment/Plan:   Diabetes: -  Currently uncontrolled at last A1C check - Reviewed long term cardiovascular and renal outcomes of uncontrolled blood sugar - Reviewed goal A1c, goal fasting, and goal 2 hour post prandial glucose - Reviewed dietary modifications including low carb diet - Recommend to continue current medication therapy at this time, could consider increasing metformin  or farxiga  if sugars still elevated, if kidney function will allow, at next PCP visit -Formulary check shows toujeo  would be no charge if patient continues to voice that basaglar  does not work as well    Follow Up Plan: 2 months  Jon VEAR Lindau, PharmD Clinical Pharmacist [phone]

## 2024-07-05 MED ORDER — TOPROL XL 25 MG PO TB24: ORAL_TABLET | ORAL | 0 refills | Status: DC

## 2024-07-12 ENCOUNTER — Telehealth: Payer: Self-pay

## 2024-07-12 NOTE — Telephone Encounter (Signed)
 Patient was identified as falling into the [place] Measure - Diabetes.   Patient was: Appointment already scheduled for:  08/11/2024.

## 2024-07-18 ENCOUNTER — Telehealth: Payer: Self-pay

## 2024-07-18 NOTE — Telephone Encounter (Signed)
 Copied from CRM [id number]. Topic: Clinical - Medication Question >> Jul 18, 2024  3:26 PM Dedra B wrote: Reason for CRM: Pt said that she needs to speak with Dr. Mercer regarding medication. She would not provide any other information and said she only wants to speak with Dr. Mercer. Pls call pt.

## 2024-07-23 ENCOUNTER — Other Ambulatory Visit: Payer: Self-pay | Admitting: Physician Assistant

## 2024-07-23 ENCOUNTER — Other Ambulatory Visit: Payer: Self-pay | Admitting: Family Medicine

## 2024-07-23 DIAGNOSIS — E782 Mixed hyperlipidemia: Secondary | ICD-10-CM

## 2024-07-24 ENCOUNTER — Other Ambulatory Visit: Payer: Self-pay | Admitting: Family Medicine

## 2024-07-24 DIAGNOSIS — F0280 Dementia in other diseases classified elsewhere without behavioral disturbance: Secondary | ICD-10-CM

## 2024-07-25 ENCOUNTER — Other Ambulatory Visit: Payer: Self-pay | Admitting: Family Medicine

## 2024-07-25 ENCOUNTER — Telehealth: Payer: Self-pay

## 2024-07-25 NOTE — Progress Notes (Signed)
   07/25/2024  Patient ID: Karen Powell, female   DOB: 08/19/1936, 88 y.o.   MRN: [id number]  Received request from PCP to reach out to patient to discuss her request to go back on Lantus . Was changed previously to basaglar  due to insurance no longer covering lantus .  Confirmed patient's insurance still does not cover lantus  at this time.  Attempted to contact patient, she refused to speak with me. Asks for direct call from PCP.  1) Patient may qualify to get Lantus  for free from [employer] company if she meets their income limits. 2) Other option is to use the $35 coupon card for patient's without insurance at an outpatient [facility] location. 3) Consider switch to Toujeo  which is covered by insurance  Routing to PCP for awareness of options  Jon VEAR Lindau, PharmD Clinical Pharmacist [phone]

## 2024-07-25 NOTE — Telephone Encounter (Signed)
 Copied from CRM [id number]. Topic: Clinical - Medication Question >> Jul 25, 2024 11:41 AM Karen Powell wrote: Reason for CRM: Patient is requesting to be put back on the insulin  glargine (LANTUS ) 100 UNIT/ML injection 90 day supply  because that's what works best for her. Patient would like a call back if there is an issue with getting her medication back

## 2024-07-25 NOTE — Telephone Encounter (Unsigned)
 Copied from CRM [id number]. Topic: Clinical - Medication Refill >> Jul 25, 2024 11:38 AM Shereese L wrote: Medication: TOPROL  XL 25 MG 24 hr tablet  Has the patient contacted their pharmacy? Yes (Agent: If no, request that the patient contact the pharmacy for the refill. If patient does not wish to contact the pharmacy document the reason why and proceed with request.) (Agent: If yes, when and what did the pharmacy advise?)  This is the patient's preferred pharmacy:  [id number] [address], [place] - [address] [place] [address] Phone: [phone] Fax: [phone]  Is this the correct pharmacy for this prescription? Yes If no, delete pharmacy and type the correct one.   Has the prescription been filled recently? Yes  Is the patient out of the medication? Yes  Has the patient been seen for an appointment in the last year OR does the patient have an upcoming appointment? Yes  Can we respond through MyChart? Yes  Agent: Please be advised that Rx refills may take up to 3 business days. We ask that you follow-up with your pharmacy.

## 2024-07-28 ENCOUNTER — Telehealth: Payer: Self-pay

## 2024-07-28 NOTE — Telephone Encounter (Signed)
 Copied from CRM [id number]. Topic: Clinical - Medication Question >> Jul 28, 2024  4:12 PM Ashley R wrote: Reason for CRM: Pt. States nurse messed up medication order to pharmacy ([facility] not take generics) asking to speak directly to Dr Mercer to address this. Would like me to relay that no callback means she no longer has a PCP. Submitting CRM per CAL.

## 2024-07-29 ENCOUNTER — Telehealth: Payer: Self-pay | Admitting: *Deleted

## 2024-07-29 NOTE — Telephone Encounter (Signed)
 Copied from CRM [id number]. Topic: Clinical - Medication Question >> Jul 28, 2024  4:12 PM Ashley R wrote: Reason for CRM: Pt. States nurse messed up medication order to pharmacy ([facility] not take generics) asking to speak directly to Dr Mercer to address this. Would like me to relay that no callback means she no longer has a PCP. Submitting CRM per CAL. >> Jul 29, 2024  1:38 PM Alexandria E wrote: Patient is requesting to speak with Dr. Mercer only, and not her nurse even. Please call patient by the end of the day to discuss.

## 2024-08-06 ENCOUNTER — Other Ambulatory Visit: Payer: Self-pay | Admitting: Family Medicine

## 2024-08-06 DIAGNOSIS — E1140 Type 2 diabetes mellitus with diabetic neuropathy, unspecified: Secondary | ICD-10-CM

## 2024-08-06 DIAGNOSIS — I10 Essential (primary) hypertension: Secondary | ICD-10-CM

## 2024-08-10 MED ORDER — LOSARTAN POTASSIUM-HCTZ 100-25 MG PO TABS: 1.0000 | ORAL_TABLET | Freq: Every day | ORAL | 3 refills | Status: DC

## 2024-08-11 ENCOUNTER — Ambulatory Visit: Admitting: Family Medicine

## 2024-08-11 ENCOUNTER — Encounter: Payer: Self-pay | Admitting: Family Medicine

## 2024-08-11 VITALS — BP 120/72 | HR 67 | Temp 97.9°F | Ht 63.0 in | Wt 157.4 lb

## 2024-08-11 DIAGNOSIS — F0390 Unspecified dementia without behavioral disturbance: Secondary | ICD-10-CM

## 2024-08-11 DIAGNOSIS — E1140 Type 2 diabetes mellitus with diabetic neuropathy, unspecified: Secondary | ICD-10-CM

## 2024-08-11 DIAGNOSIS — Z794 Long term (current) use of insulin: Secondary | ICD-10-CM | POA: Diagnosis not present

## 2024-08-11 LAB — POCT GLYCOSYLATED HEMOGLOBIN (HGB A1C): Hemoglobin A1C: 9.8 % — AB (ref 4.0–5.6)

## 2024-08-11 MED ORDER — ALCOHOL WIPES 70 % EX MISC: 1.0000 | CUTANEOUS | 3 refills | Status: AC

## 2024-08-11 MED ORDER — LANTUS SOLOSTAR 100 UNIT/ML ~~LOC~~ SOPN: 20.0000 [IU] | PEN_INJECTOR | Freq: Every day | SUBCUTANEOUS | 5 refills | Status: DC

## 2024-08-11 NOTE — Progress Notes (Signed)
 "  Established Patient Office Visit   Subjective  Patient ID: Karen Powell, female    DOB: 1936-08-20  Age: 88 y.o. MRN: [id number]  Chief Complaint  Patient presents with   Medical Management of Chronic Issues  Pt started Debbie accompanies patient to appointment however patient did not want her in the room during the visit.  Patient is an 88 year old female with dementia seen for follow-up.  Patient was seen a letter from the [employer] stating they will now start covering Lantus  in January.  Patient would like medication switched as she does not feel Basaglar  works as well.  Requesting refills on BD ultrafine needles 6 mm and alcohol  wipes.  She discusses challenges with medication costs and insurance coverage. Toprol  was initially very expensive, costing $440, but she managed to pay $220. [facility] and prefers mail order services for faster delivery.  She has a history of high blood sugar levels, with readings reaching 200-300 mg/dL. Her blood sugar used to be more stable on a different medication regimen. She attributes some of her stress and elevated blood sugar levels to her living situation, where she feels overwhelmed by family dynamics and noise.  She lives with family but desires to live independently due to stress from family dynamics.     Patient Active Problem List   Diagnosis Date Noted   Acute deep vein thrombosis (DVT) of femoral vein of left lower extremity (HCC) 07/14/2023   Alzheimer disease (HCC) 08/01/2020   Trigger little finger of left hand 11/25/2018   Gastroesophageal reflux disease 11/25/2018   Palpitations 03/30/2017   Right hip pain 03/16/2017   Chronic fatigue 02/11/2017   Diabetes mellitus (HCC) 12/30/2016   Hyperlipidemia 12/30/2016   Essential hypertension 12/30/2016   Past Medical History:  Diagnosis Date   Arthritis    Blood transfusion without reported diagnosis     Depression    Diabetic neuropathy (HCC)    Dizziness    Frequent headaches    Hyperlipidemia    Hypertension    Migraines    Palpitations    Urinary incontinence    Past Surgical History:  Procedure Laterality Date   none     Social History   Tobacco Use   Smoking status: Never   Smokeless tobacco: Never  Vaping Use   Vaping status: Never Used  Substance Use Topics   Alcohol  use: Yes   Drug use: No   Family History  Problem Relation Age of Onset   Diabetes Mother    Hypertension Mother    Diabetes Maternal Grandmother    Diabetes Maternal Grandfather    Allergies  Allergen Reactions   Penicillins Anaphylaxis    ROS Negative unless stated above    Objective:     BP 120/72 (BP Location: Left Arm, Patient Position: Sitting, Cuff Size: Normal)   Pulse 67   Temp 97.9 F (36.6 C) (Oral)   Ht 5' 3 (1.6 m)   Wt 157 lb 6.4 oz (71.4 kg)   SpO2 100%   BMI 27.88 kg/m  BP Readings from Last 3 Encounters:  08/11/24 120/72  05/12/24 (!) 170/84  02/11/24 (!) 142/84   Wt Readings from Last 3 Encounters:  08/11/24 157 lb 6.4 oz (71.4 kg)  05/12/24 160 lb 6.4 oz (72.8 kg)  02/11/24 158 lb 12.8 oz (72 kg)      Physical Exam Constitutional:      General: She is not in acute  distress.    Appearance: Normal appearance.  HENT:     Head: Normocephalic and atraumatic.     Nose: Nose normal.     Mouth/Throat:     Mouth: Mucous membranes are moist.  Cardiovascular:     Rate and Rhythm: Normal rate and regular rhythm.     Heart sounds: Normal heart sounds. No murmur heard.    No gallop.  Pulmonary:     Effort: Pulmonary effort is normal. No respiratory distress.     Breath sounds: Normal breath sounds. No wheezing, rhonchi or rales.  Skin:    General: Skin is warm and dry.  Neurological:     Mental Status: She is alert and oriented to person, place, and time.        05/12/2024   11:30 AM 05/01/2022   11:39 AM 01/30/2022   11:15 AM  Depression screen PHQ  2/9  Decreased Interest  3 3  Down, Depressed, Hopeless 2 2 1   PHQ - 2 Score 2 5 4   Altered sleeping 1 2 2   Tired, decreased energy 1 1 3   Change in appetite 0 1 3  Feeling bad or failure about yourself  2 2 2   Trouble concentrating 2 0 2  Moving slowly or fidgety/restless 2 0 3  Suicidal thoughts 0 0 0  PHQ-9 Score 10  11  19    Difficult doing work/chores Not difficult at all Not difficult at all Somewhat difficult     Data saved with a previous flowsheet row definition      05/12/2024   11:30 AM 12/06/2021    3:30 PM 05/09/2021   12:14 PM 11/10/2020    5:46 PM  GAD 7 : Generalized Anxiety Score  Nervous, Anxious, on Edge 0 0 1 1  Control/stop worrying 1  1 2   Worry too much - different things 1  1 1   Trouble relaxing 2 0 1 1  Restless 0  1 1  Easily annoyed or irritable 1  1 2   Afraid - awful might happen 0 0 1 1  Total GAD 7 Score 5  7 9   Anxiety Difficulty Not difficult at all   Somewhat difficult     No results found for any visits on 08/11/24.    Assessment & Plan:   Type 2 diabetes mellitus with diabetic neuropathy, with long-term current use of insulin  (HCC) -     POCT glycosylated hemoglobin (Hb A1C) -     Lantus  SoloStar; Inject 20 Units into the skin at bedtime.  Dispense: 15 mL; Refill: 5 -     Alcohol  Wipes; Apply 1 Application topically as directed.  Dispense: 100 each; Refill: 3  Dementia without behavioral disturbance (HCC)   Hemoglobin A1c 9% on 05/12/2024.  Difficulty adequately controlling blood sugar given patient's dementia and desire for limited family involvement.  Patient does live with her granddaughter but does not like her family involved in her healthcare.  Discussed avoiding self adjusting medications due to risk of hypoglycemia.  Patient advised to eat regular meals.  Patient did agree to provider having a brief discussion with her daughter similar concerns expressed with patient's daughter.  Continue Aricept  5 mg daily.  New Rx for Lantus  sent  to pharmacy.  Patient advised insurance may not cover until beginning of next year.  Previously followed by clinical pharmacist.  Advised to reconsider this service.  Would like to restart mail-order pharmacy.  Discussed with patient's daughter.  [facility]  Close follow-up every 3-4 months, sooner if needed.  Return in about 4 months (around 12/09/2024).   Clotilda JONELLE Single, MD "

## 2024-09-05 ENCOUNTER — Other Ambulatory Visit (INDEPENDENT_AMBULATORY_CARE_PROVIDER_SITE_OTHER)

## 2024-09-05 ENCOUNTER — Telehealth: Payer: Self-pay

## 2024-09-05 DIAGNOSIS — Z794 Long term (current) use of insulin: Secondary | ICD-10-CM

## 2024-09-05 DIAGNOSIS — E1140 Type 2 diabetes mellitus with diabetic neuropathy, unspecified: Secondary | ICD-10-CM

## 2024-09-05 DIAGNOSIS — I10 Essential (primary) hypertension: Secondary | ICD-10-CM

## 2024-09-05 MED ORDER — TOPROL XL 25 MG PO TB24: ORAL_TABLET | ORAL | 0 refills | Status: DC

## 2024-09-05 NOTE — Addendum Note (Signed)
 Addended by: DARRELL BRUCKNER on: 09/05/2024 12:52 PM   Modules accepted: Orders

## 2024-09-05 NOTE — Progress Notes (Signed)
 09/05/2024 Name: Karen Powell MRN: [id number] DOB: Apr 26, 1936  Chief Complaint  Patient presents with   Medication Management   Diabetes    Karen Powell is a 88 y.o. year old female who presented for a telephone visit.   They were referred to the pharmacist by TNM report for assistance in managing diabetes.    Subjective:  Care Team: Primary Care Provider: Mercer Clotilda SAUNDERS, MD ;  Medication Access/Adherence  [facility] Pharmacy:  [facility] [address] [place] - [address] [place] [address] Phone: [phone] Fax: [phone]  [facility] [address] [place] [address] Phone: [phone] Fax: [phone]   Patient reports affordability concerns with their medications: No  Patient reports access/transportation concerns to their pharmacy: No  Patient reports adherence concerns with their medications:  No     Diabetes:  Current medications: Farxiga  5, Metformin  500mg  BID, Basaglar  20 units BID, Novolog  15 units tid Medications tried in the past: None  Self adjusts insulins based on sugars. Will increase Basaglar  to 26 units if she feels she needs it and will skip her morning dose of Basaglar  if morning sugars are below 140-150. Sometimes gives Novolog  at 18 units  Current Sugar Readings: Remaining elevated since switching from lantus  to basaglar   Admits to a lot of stress at home and reports this raises her sugars. States she is not going to really change how she eats  Objective:  Lab Results  Component Value Date   HGBA1C 9.8 (A) 08/11/2024    Lab Results  Component Value Date   CREATININE 0.87 02/11/2024   BUN 26 (H) 02/11/2024   NA 139 02/11/2024   K 3.9 02/11/2024   CL 104 02/11/2024   CO2 27 02/11/2024    Lab Results  Component Value Date   CHOL 157 02/11/2024   HDL 50.00 02/11/2024   LDLCALC 85 02/11/2024   TRIG 108.0 02/11/2024   CHOLHDL 3 02/11/2024     Medications Reviewed Today     Reviewed by Lionell Jon DEL, RPH (Pharmacist) on 09/05/24 at 1022  Med List Status: <None>   Medication Order Taking? Sig Documenting Provider Last Dose Status Informant  acetaminophen (TYLENOL) 500 MG tablet [id number]  Take 500 mg by mouth 2 (two) times daily as needed. [provider]  Active   Alcohol  Swabs  70 % PADS [id number]  USE FOR CHECKING BLOOD SUGAR AT LEAST 3 TIMES DAILY Mercer Clotilda SAUNDERS, MD  Active   Apoaequorin (PREVAGEN PO) [id number]  Take 1 capsule by mouth daily. [provider]  Active   BD PEN NEEDLE MICRO ULTRAFINE 32G X 6 MM MISC [id number]  USE TO ADMINISTER INSULIN  THREE TIMES DAILY Mercer Clotilda SAUNDERS, MD  Active   Biotin 800 MCG TABS [id number]  Take 800 mcg by mouth daily at 2 PM. [provider]  Active   Cetirizine HCl (ZYRTEC ALLERGY PO) [id number]  Take 1 tablet by mouth daily. [provider]  Active   Coenzyme Q10 (COQ10) 200 MG CAPS [id number]  Take 1 capsule by mouth daily. [provider]  Active   Continuous Blood Gluc Sensor (FREESTYLE LIBRE 2 SENSOR) [place] [id number]  Use to check blood sugars. Mercer Clotilda SAUNDERS, MD  Active   Cranberry-Vitamin C (AZO CRANBERRY URINARY TRACT PO) [id number]  Take 1 tablet by mouth daily at 2 PM. [provider]  Active   donepezil  (ARICEPT ) 5 MG tablet [id number]  TAKE 1 TABLET BY MOUTH EVERYDAY AT BEDTIME  Mercer Clotilda SAUNDERS, MD  Active   esomeprazole (NEXIUM) 20 MG capsule [id number]  Take 20 mg by mouth daily at 12 noon. [provider]  Active   FARXIGA  5 MG TABS tablet [id number]  TAKE 1 TABLET BY MOUTH EVERY DAY BEFORE BREAKFAST Mercer Clotilda SAUNDERS, MD  Active   Ginger, Zingiber officinalis, (GINGER ROOT PO) [id number]  Take 1 tablet by mouth daily at 2 PM. [provider]  Active   insulin  aspart (NOVOLOG  FLEXPEN) 100 UNIT/ML FlexPen [id number]  INJECT 15 UNITS INTO THE SKIN 3 (THREE) TIMES DAILY WITH MEALS. Mercer Clotilda SAUNDERS, MD  Active    insulin  glargine (LANTUS  SOLOSTAR) 100 UNIT/ML Solostar Pen [id number]  Inject 20 Units into the skin at bedtime. Mercer Clotilda SAUNDERS, MD  Active   Isopropyl Alcohol  (ALCOHOL  WIPES) 70 % MISC [id number]  Apply 1 Application topically as directed. Mercer Clotilda SAUNDERS, MD  Active   losartan -hydrochlorothiazide (HYZAAR) 100-25 MG tablet [id number]  Take 1 tablet by mouth daily. Mercer Clotilda SAUNDERS, MD  Active   metFORMIN  (GLUCOPHAGE ) 500 MG tablet [id number]  TAKE 1 TABLET BY MOUTH TWICE A DAY Mercer Clotilda SAUNDERS, MD  Active   Misc Natural Products (BEET ROOT) 500 MG CAPS [id number]  Take 500 mg by mouth daily. [provider]  Active   Multiple Vitamins-Minerals (CENTRUM PO) [id number]  Take 1 tablet by mouth daily. [provider]  Active   Naproxen Sodium (ALEVE PO) [id number]  Take 2 tablets by mouth daily as needed (pain). [provider]  Active            Med Note BUELAH, MADISON B   Tue Jul 14, 2023  3:40 PM) Holding while on Eliquis   Omega-3 Fatty Acids (OMEGA 3 PO) [id number]  Take 1 capsule by mouth daily. [provider]  Active   Polyethyl Glycol-Propyl Glycol (SYSTANE ULTRA OP) [id number]  Apply 1 drop to eye daily. [provider]  Active   Probiotic Product (PROBIOTIC PO) [id number]  Take 1 capsule by mouth at bedtime. [provider]  Active   rosuvastatin  (CRESTOR ) 20 MG tablet [id number]  TAKE 1 TABLET BY MOUTH EVERYDAY AT BEDTIME Mercer Clotilda SAUNDERS, MD  Active   Sennosides-Docusate Sodium (SENOKOT S PO) [id number]  Take 1 tablet by mouth 2 (two) times daily. [provider]  Active   TOPROL  XL 25 MG 24 hr tablet [id number]  TAKE 2 TABLETS (50 MG) IN THE MORNING AND 1 TABLET (25 MG) IN THE EVENING Conte, Tessa N, PA-C  Active   Turmeric 500 MG TABS [id number]  Take 1 tablet by mouth daily. [provider]  Active               Assessment/Plan:   Diabetes: - Currently uncontrolled at last A1C check - Reviewed long term  cardiovascular and renal outcomes of uncontrolled blood sugar - Reviewed goal A1c, goal fasting, and goal 2 hour post prandial glucose - Reviewed dietary modifications including low carb diet - Recommend to continue current medication therapy at this time, could consider increasing metformin  or farxiga  if sugars still elevated, if kidney function will allow, at next PCP visit -Patient consents to apply to Lantus  PAP through [employer], would like in mail, coordinating with CPhT    Jon VEAR Lindau, PharmD Clinical Pharmacist [phone]

## 2024-09-05 NOTE — Patient Instructions (Signed)
 Karen Powell

## 2024-09-05 NOTE — Telephone Encounter (Signed)
 Filled and mail out pt portion and faxed provider portion.[employer] (Lantus )

## 2024-09-19 ENCOUNTER — Other Ambulatory Visit (HOSPITAL_COMMUNITY): Payer: Self-pay

## 2024-09-19 NOTE — Telephone Encounter (Signed)
 Received pt portion [employer] (Lantus ) waiting on provider portion.

## 2024-09-20 NOTE — Progress Notes (Signed)
 " Cardiology Office Note:   Date:  09/20/2024  ID:  Karen Powell, DOB Nov 07, 1935, MRN [id number] PCP: Mercer Clotilda SAUNDERS, MD  [facility] Providers Cardiologist:  Georganna Archer, MD { Chief Complaint:  Chief Complaint  Patient presents with   Hypertension      History of Present Illness:   Karen Powell is a 88 y.o. female with a PMH of HTN, HLD, DM2, dementia who presents for follow up.  She is doing well today without any complaints.  She taking all of her medications as prescribed.  Denies chest pain, SOB, PND and orthopnea.  She is alone today, but her daughter is waiting out in the waiting room.  No other questions or concerns.   Past Medical History:  Diagnosis Date   Arthritis    Blood transfusion without reported diagnosis    Depression    Diabetic neuropathy (HCC)    Dizziness    Frequent headaches    Hyperlipidemia    Hypertension    Migraines    Palpitations    Urinary incontinence      Studies Reviewed:    EKG:  EKG Interpretation Date/Time:  Wednesday September 21 2024 10:05:38 EST Ventricular Rate:  58 PR Interval:  176 QRS Duration:  76 QT Interval:  404 QTC Calculation: 396 R Axis:   -15  Text Interpretation: Sinus bradycardia Minimal voltage criteria for LVH, may be normal variant ( R in aVL ) When compared with ECG of 22-Jun-2023 13:23, No significant change was found Confirmed by Archer Georganna [phone]) on 09/21/2024 10:11:59 AM         Risk Assessment/Calculations:           Physical Exam:     VS:  BP (!) 152/74 (BP Location: Right Arm, Patient Position: Sitting, Cuff Size: Normal)   Pulse (!) 58   Ht 5' 2 (1.575 m)   Wt 161 lb 3.2 oz (73.1 kg)   SpO2 98%   BMI 29.48 kg/m      Wt Readings from Last 3 Encounters:  08/11/24 157 lb 6.4 oz (71.4 kg)  05/12/24 160 lb 6.4 oz (72.8 kg)  02/11/24 158 lb 12.8 oz (72 kg)     GEN: Well nourished, well developed, in no acute distress NECK: No JVD; No carotid bruits CARDIAC:  RRR, no murmurs, rubs, gallops RESPIRATORY:  Clear to auscultation without rales, wheezing or rhonchi  ABDOMEN: Soft, non-tender, non-distended, normal bowel sounds EXTREMITIES:  Warm and well perfused, no edema; No deformity, 2+ radial pulses PSYCH: Normal mood and affect   Assessment & Plan Primary hypertension - Blood pressure is elevated today.  She attributes this to getting into a  fussing match with her daughter prior to arriving; however, she has had a couple of elevated blood pressure readings in the recent past and her EKG demonstrates LVH. -I believe that she needs tighter blood pressure control. -She is on metoprolol  which is not great for blood pressure, but she feels strongly about continuing this. -We will start amlodipine 5 mg daily for tighter control. Start amlodipine 5 mg daily Continue Hyzaar 100-25 Continue metoprolol  succinate 50 mg a.m., 25 mg p.m. Follow-up in 6 months Mixed hyperlipidemia - Last LDL not quite at goal. - Will check lipid panel today. Lipid panel          This note was written with the assistance of a dictation microphone or AI dictation software. Please excuse any typos or grammatical errors.   Signed, Georganna Archer, MD 09/20/2024  10:43 PM    [facility]  "

## 2024-09-21 ENCOUNTER — Other Ambulatory Visit (HOSPITAL_COMMUNITY): Payer: Self-pay

## 2024-09-21 ENCOUNTER — Encounter: Payer: Self-pay | Admitting: Student in an Organized Health Care Education/Training Program

## 2024-09-21 ENCOUNTER — Other Ambulatory Visit: Payer: Self-pay | Admitting: Family Medicine

## 2024-09-21 ENCOUNTER — Ambulatory Visit: Attending: Internal Medicine | Admitting: Student in an Organized Health Care Education/Training Program

## 2024-09-21 VITALS — BP 152/74 | HR 58 | Ht 62.0 in | Wt 161.2 lb

## 2024-09-21 DIAGNOSIS — I10 Essential (primary) hypertension: Secondary | ICD-10-CM | POA: Insufficient documentation

## 2024-09-21 DIAGNOSIS — E782 Mixed hyperlipidemia: Secondary | ICD-10-CM | POA: Diagnosis not present

## 2024-09-21 DIAGNOSIS — E1140 Type 2 diabetes mellitus with diabetic neuropathy, unspecified: Secondary | ICD-10-CM

## 2024-09-21 LAB — LIPID PANEL
Chol/HDL Ratio: 2.9 ratio (ref 0.0–4.4)
Cholesterol, Total: 153 mg/dL (ref 100–199)
HDL: 52 mg/dL
LDL Chol Calc (NIH): 74 mg/dL (ref 0–99)
Triglycerides: 161 mg/dL — ABNORMAL HIGH (ref 0–149)
VLDL Cholesterol Cal: 27 mg/dL (ref 5–40)

## 2024-09-21 MED ORDER — AMLODIPINE BESYLATE 5 MG PO TABS
5.0000 mg | ORAL_TABLET | Freq: Every day | ORAL | 3 refills | Status: DC
  Filled 2024-09-21: qty 90, 90d supply, fill #0

## 2024-09-21 MED ORDER — TOPROL XL 25 MG PO TB24: ORAL_TABLET | ORAL | 3 refills | Status: AC

## 2024-09-21 MED ORDER — AMLODIPINE BESYLATE 5 MG PO TABS: 5.0000 mg | ORAL_TABLET | Freq: Every day | ORAL | 3 refills | Status: AC

## 2024-09-21 NOTE — Assessment & Plan Note (Signed)
-   Blood pressure is elevated today.  She attributes this to getting into a  fussing match with her daughter prior to arriving; however, she has had a couple of elevated blood pressure readings in the recent past and her EKG demonstrates LVH. -I believe that she needs tighter blood pressure control. -She is on metoprolol  which is not great for blood pressure, but she feels strongly about continuing this. -We will start amlodipine 5 mg daily for tighter control. Start amlodipine 5 mg daily Continue Hyzaar 100-25 Continue metoprolol  succinate 50 mg a.m., 25 mg p.m. Follow-up in 6 months

## 2024-09-21 NOTE — Assessment & Plan Note (Signed)
-   Last LDL not quite at goal. - Will check lipid panel today. Lipid panel

## 2024-09-21 NOTE — Patient Instructions (Signed)
 Medication Instructions:  START Amlodipine 5 mg daily   *If you need a refill on your cardiac medications before your next appointment, please call your pharmacy*  Lab Work: Lipid panel   If you have labs (blood work) drawn today and your tests are completely normal, you will receive your results only by: MyChart Message (if you have MyChart) OR A paper copy in the mail If you have any lab test that is abnormal or we need to change your treatment, we will call you to review the results.    Follow-Up: At [facility], you and your health needs are our priority.  As part of our continuing mission to provide you with exceptional heart care, our providers are all part of one team.  This team includes your primary Cardiologist (physician) and Advanced Practice Providers or APPs (Physician Assistants and Nurse Practitioners) who all work together to provide you with the care you need, when you need it.  Your next appointment:   6 month(s)  Provider:   Georganna Archer, MD    We recommend signing up for the patient portal called MyChart.  Sign up information is provided on this After Visit Summary.  MyChart is used to connect with patients for Virtual Visits (Telemedicine).  Patients are able to view lab/test results, encounter notes, upcoming appointments, etc.  Non-urgent messages can be sent to your provider as well.   To learn more about what you can do with MyChart, go to [email or web address].

## 2024-09-22 ENCOUNTER — Ambulatory Visit: Payer: Self-pay | Admitting: Student in an Organized Health Care Education/Training Program

## 2024-09-22 DIAGNOSIS — E782 Mixed hyperlipidemia: Secondary | ICD-10-CM

## 2024-09-26 MED ORDER — ROSUVASTATIN CALCIUM 40 MG PO TABS: 40.0000 mg | ORAL_TABLET | Freq: Every day | ORAL | 3 refills | Status: AC

## 2024-10-03 ENCOUNTER — Telehealth: Payer: Self-pay | Admitting: *Deleted

## 2024-10-03 DIAGNOSIS — E1140 Type 2 diabetes mellitus with diabetic neuropathy, unspecified: Secondary | ICD-10-CM

## 2024-10-03 DIAGNOSIS — I10 Essential (primary) hypertension: Secondary | ICD-10-CM

## 2024-10-03 MED ORDER — DAPAGLIFLOZIN PROPANEDIOL 5 MG PO TABS: ORAL_TABLET | ORAL | 3 refills | Status: AC

## 2024-10-03 MED ORDER — METFORMIN HCL 500 MG PO TABS: 500.0000 mg | ORAL_TABLET | Freq: Two times a day (BID) | ORAL | 1 refills | Status: AC

## 2024-10-03 MED ORDER — LANTUS SOLOSTAR 100 UNIT/ML ~~LOC~~ SOPN: 20.0000 [IU] | PEN_INJECTOR | Freq: Every day | SUBCUTANEOUS | 2 refills | Status: AC

## 2024-10-03 MED ORDER — NOVOLOG FLEXPEN 100 UNIT/ML ~~LOC~~ SOPN: PEN_INJECTOR | SUBCUTANEOUS | 3 refills | Status: AC

## 2024-10-03 MED ORDER — LOSARTAN POTASSIUM-HCTZ 100-25 MG PO TABS: 1.0000 | ORAL_TABLET | Freq: Every day | ORAL | 1 refills | Status: AC

## 2024-10-03 NOTE — Telephone Encounter (Signed)
 Copied from CRM #[id number]. Topic: Clinical - Prescription Issue >> Oct 03, 2024 12:31 PM Aleatha C wrote: Reason for CRM: Patient said she needs a 90 day supply for all her medications from Dr Cl[id number] [place] [place] - [address] [place] [address] Phone: [phone] Fax: [phone] Hours: Not open 24 hours

## 2024-10-05 ENCOUNTER — Telehealth: Payer: Self-pay

## 2024-10-05 NOTE — Telephone Encounter (Signed)
 Received provider portion [employer](Lantus ),faxed to [employer] along pt portion and Ins. Card.

## 2024-10-05 NOTE — Progress Notes (Signed)
" ° °  10/05/2024  Patient ID: Karen Powell, female   DOB: April 28, 1936, 89 y.o.   MRN: [id number]  Faxed completed provider application for [employer] program for Lantus  to [employer] for processing.  Jon VEAR Lindau, PharmD Clinical Pharmacist [phone]  "

## 2024-10-12 ENCOUNTER — Telehealth: Payer: Self-pay

## 2024-10-12 NOTE — Progress Notes (Signed)
" ° °  10/12/2024  Patient ID: Ronal Essex, female   DOB: [birth year], 89 y.o.   MRN: [id number]  Received request from [employer] for a copy of patient's insurance card to continue processing lantus  application.  Faxed in to company, pending decision.  Jon VEAR Lindau, PharmD Clinical Pharmacist [phone]  "

## 2024-10-17 ENCOUNTER — Telehealth: Payer: Self-pay

## 2024-10-17 NOTE — Telephone Encounter (Signed)
 Copied from CRM #[id number]. Topic: Clinical - Medical Advice >> Oct 14, 2024  5:11 PM Charolett L wrote: Reason for CRM: Christina Patient connection in reference to getting assistance for the lantus  Copy of insurance card for silver script is needed to finish processing application for free medication  FAX# [phone] CB# [phone]

## 2024-10-18 NOTE — Telephone Encounter (Signed)
 Information was faxed over on 10/12/24. Contacted [employer] and rep confirmed received, just waiting for processing. Report they are a little behind on processing right now but should get processed soon and if patient approved, will ship protect. We will get further notifications via fax.

## 2024-10-28 ENCOUNTER — Telehealth: Payer: Self-pay | Admitting: *Deleted

## 2024-10-28 NOTE — Telephone Encounter (Unsigned)
 Copied from CRM [id number]. Topic: Clinical - Medication Refill >> Oct 28, 2024 12:23 PM Uyopbjy D wrote: Medication: TOPROL  XL 25 MG 24 hr tablet  Has the patient contacted their pharmacy? No (Agent: If no, request that the patient contact the pharmacy for the refill. If patient does not wish to contact the pharmacy document the reason why and proceed with request.) (Agent: If yes, when and what did the pharmacy advise?)  This is the patient's preferred pharmacy:  [id number] [address], [place] - [address] [place] [address] Phone: [phone] Fax: [phone]  Is this the correct pharmacy for this prescription? Yes If no, delete pharmacy and type the correct one.   Has the prescription been filled recently? No  Is the patient out of the medication? No she says she has a few left but she takes 3 a day.  Has the patient been seen for an appointment in the last year OR does the patient have an upcoming appointment? Yes  Can we respond through MyChart? No  Agent: Please be advised that Rx refills may take up to 3 business days. We ask that you follow-up with your pharmacy. >> Oct 28, 2024 12:24 PM Gustabo D wrote: Pt needs a 90 day supply ASAP says it's life threatening if she don't take it it will kill her.

## 2024-12-08 ENCOUNTER — Ambulatory Visit: Admitting: Family Medicine
